# Patient Record
Sex: Male | Born: 1960 | Race: Black or African American | Hispanic: No | Marital: Single | Smoking: Current every day smoker
Health system: Southern US, Community
[De-identification: ages and names within clinical notes are randomized; demographics above are authoritative.]

## PROBLEM LIST (undated history)

## (undated) DIAGNOSIS — F1910 Other psychoactive substance abuse, uncomplicated: Secondary | ICD-10-CM

## (undated) DIAGNOSIS — M109 Gout, unspecified: Secondary | ICD-10-CM

## (undated) DIAGNOSIS — I10 Essential (primary) hypertension: Secondary | ICD-10-CM

## (undated) DIAGNOSIS — E7800 Pure hypercholesterolemia, unspecified: Secondary | ICD-10-CM

## (undated) HISTORY — DX: Essential (primary) hypertension: I10

## (undated) HISTORY — PX: HERNIA REPAIR: SHX51

## (undated) HISTORY — DX: Other psychoactive substance abuse, uncomplicated: F19.10

## (undated) HISTORY — PX: TONSILLECTOMY: SUR1361

---
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
## 1991-07-18 HISTORY — PX: ELBOW FUSION: SHX617

## 2004-05-26 ENCOUNTER — Ambulatory Visit: Payer: Self-pay | Admitting: Family Medicine

## 2004-06-23 ENCOUNTER — Ambulatory Visit: Payer: Self-pay | Admitting: Family Medicine

## 2004-08-19 ENCOUNTER — Emergency Department (HOSPITAL_COMMUNITY): Admission: EM | Admit: 2004-08-19 | Discharge: 2004-08-19 | Payer: Self-pay | Admitting: Emergency Medicine

## 2004-09-22 ENCOUNTER — Ambulatory Visit: Payer: Self-pay | Admitting: Family Medicine

## 2005-01-23 ENCOUNTER — Ambulatory Visit: Payer: Self-pay | Admitting: Family Medicine

## 2005-01-24 ENCOUNTER — Ambulatory Visit (HOSPITAL_COMMUNITY): Admission: RE | Admit: 2005-01-24 | Discharge: 2005-01-24 | Payer: Self-pay | Admitting: Family Medicine

## 2005-03-25 ENCOUNTER — Emergency Department (HOSPITAL_COMMUNITY): Admission: EM | Admit: 2005-03-25 | Discharge: 2005-03-25 | Payer: Self-pay | Admitting: Emergency Medicine

## 2005-05-10 ENCOUNTER — Ambulatory Visit: Payer: Self-pay | Admitting: Family Medicine

## 2005-05-23 ENCOUNTER — Ambulatory Visit: Payer: Self-pay | Admitting: Family Medicine

## 2005-07-13 ENCOUNTER — Ambulatory Visit: Payer: Self-pay | Admitting: Family Medicine

## 2005-08-10 ENCOUNTER — Ambulatory Visit (HOSPITAL_COMMUNITY): Admission: RE | Admit: 2005-08-10 | Discharge: 2005-08-10 | Payer: Self-pay | Admitting: General Surgery

## 2005-09-13 ENCOUNTER — Ambulatory Visit: Payer: Self-pay | Admitting: Family Medicine

## 2005-10-18 ENCOUNTER — Ambulatory Visit: Payer: Self-pay | Admitting: Family Medicine

## 2005-12-10 ENCOUNTER — Emergency Department (HOSPITAL_COMMUNITY): Admission: EM | Admit: 2005-12-10 | Discharge: 2005-12-10 | Payer: Self-pay | Admitting: Emergency Medicine

## 2006-02-20 ENCOUNTER — Ambulatory Visit: Payer: Self-pay | Admitting: Family Medicine

## 2006-02-21 ENCOUNTER — Ambulatory Visit: Payer: Self-pay | Admitting: Family Medicine

## 2006-04-11 ENCOUNTER — Ambulatory Visit: Payer: Self-pay | Admitting: Family Medicine

## 2006-04-17 ENCOUNTER — Ambulatory Visit: Payer: Self-pay | Admitting: Family Medicine

## 2006-05-02 ENCOUNTER — Ambulatory Visit: Payer: Self-pay | Admitting: Family Medicine

## 2006-05-24 ENCOUNTER — Ambulatory Visit: Payer: Self-pay | Admitting: Family Medicine

## 2006-07-19 ENCOUNTER — Ambulatory Visit: Payer: Self-pay | Admitting: Family Medicine

## 2006-11-23 ENCOUNTER — Ambulatory Visit: Payer: Self-pay | Admitting: Family Medicine

## 2007-02-06 ENCOUNTER — Encounter: Payer: Self-pay | Admitting: Family Medicine

## 2007-02-06 ENCOUNTER — Encounter (INDEPENDENT_AMBULATORY_CARE_PROVIDER_SITE_OTHER): Payer: Self-pay | Admitting: *Deleted

## 2007-02-06 LAB — CONVERTED CEMR LAB
Alkaline Phosphatase: 65 units/L (ref 39–117)
BUN: 29 mg/dL — ABNORMAL HIGH (ref 6–23)
Basophils Relative: 1 % (ref 0–1)
Bilirubin, Direct: 0.2 mg/dL (ref 0.0–0.3)
CO2: 22 meq/L (ref 19–32)
Chloride: 106 meq/L (ref 96–112)
Creatinine, Ser: 1.24 mg/dL (ref 0.40–1.50)
Glucose, Bld: 86 mg/dL (ref 70–99)
Hemoglobin: 14.3 g/dL (ref 13.0–17.0)
Indirect Bilirubin: 1 mg/dL — ABNORMAL HIGH (ref 0.0–0.9)
LDL Cholesterol: 80 mg/dL (ref 0–99)
MCHC: 33.5 g/dL (ref 30.0–36.0)
Monocytes Absolute: 0.4 10*3/uL (ref 0.2–0.7)
Monocytes Relative: 7 % (ref 3–11)
Neutro Abs: 3.2 10*3/uL (ref 1.7–7.7)
RBC: 4.28 M/uL (ref 4.22–5.81)
Total Bilirubin: 1.2 mg/dL (ref 0.3–1.2)

## 2007-02-07 ENCOUNTER — Encounter: Payer: Self-pay | Admitting: Family Medicine

## 2007-02-07 LAB — CONVERTED CEMR LAB
Free T4: 1.27 ng/dL (ref 0.89–1.80)
T3, Free: 2.6 pg/mL (ref 2.3–4.2)

## 2007-02-18 ENCOUNTER — Ambulatory Visit: Payer: Self-pay | Admitting: Family Medicine

## 2007-06-03 ENCOUNTER — Ambulatory Visit: Payer: Self-pay | Admitting: Family Medicine

## 2007-10-16 ENCOUNTER — Ambulatory Visit: Payer: Self-pay | Admitting: Family Medicine

## 2007-11-22 ENCOUNTER — Ambulatory Visit: Payer: Self-pay | Admitting: Family Medicine

## 2007-11-27 ENCOUNTER — Encounter (INDEPENDENT_AMBULATORY_CARE_PROVIDER_SITE_OTHER): Payer: Self-pay | Admitting: *Deleted

## 2007-11-27 DIAGNOSIS — F17200 Nicotine dependence, unspecified, uncomplicated: Secondary | ICD-10-CM

## 2007-11-27 DIAGNOSIS — F79 Unspecified intellectual disabilities: Secondary | ICD-10-CM | POA: Insufficient documentation

## 2007-12-18 ENCOUNTER — Emergency Department (HOSPITAL_COMMUNITY): Admission: EM | Admit: 2007-12-18 | Discharge: 2007-12-18 | Payer: Self-pay | Admitting: Emergency Medicine

## 2007-12-28 ENCOUNTER — Emergency Department (HOSPITAL_COMMUNITY): Admission: EM | Admit: 2007-12-28 | Discharge: 2007-12-28 | Payer: Self-pay | Admitting: Emergency Medicine

## 2008-01-27 ENCOUNTER — Encounter: Payer: Self-pay | Admitting: Family Medicine

## 2008-01-27 ENCOUNTER — Emergency Department (HOSPITAL_COMMUNITY): Admission: EM | Admit: 2008-01-27 | Discharge: 2008-01-27 | Payer: Self-pay | Admitting: Emergency Medicine

## 2008-01-27 LAB — CONVERTED CEMR LAB
ALT: 21 units/L (ref 0–53)
BUN: 18 mg/dL (ref 6–23)
Basophils Relative: 0 % (ref 0–1)
Chloride: 101 meq/L (ref 96–112)
Glucose, Bld: 77 mg/dL (ref 70–99)
Indirect Bilirubin: 0.7 mg/dL (ref 0.0–0.9)
LDL Cholesterol: 130 mg/dL — ABNORMAL HIGH (ref 0–99)
Lymphs Abs: 2.3 10*3/uL (ref 0.7–4.0)
Monocytes Relative: 8 % (ref 3–12)
Neutro Abs: 3.8 10*3/uL (ref 1.7–7.7)
Neutrophils Relative %: 51 % (ref 43–77)
PSA: 1.02 ng/mL (ref 0.10–4.00)
Platelets: 156 10*3/uL (ref 150–400)
Potassium: 4 meq/L (ref 3.5–5.3)
RBC: 4.05 M/uL — ABNORMAL LOW (ref 4.22–5.81)
Sodium: 142 meq/L (ref 135–145)
TSH: 1.563 microintl units/mL (ref 0.350–4.50)
Total CHOL/HDL Ratio: 2.3
Total Protein: 7.7 g/dL (ref 6.0–8.3)
Triglycerides: 79 mg/dL (ref <150)
VLDL: 16 mg/dL (ref 0–40)
WBC: 7.4 10*3/uL (ref 4.0–10.5)

## 2008-01-29 ENCOUNTER — Emergency Department (HOSPITAL_COMMUNITY): Admission: EM | Admit: 2008-01-29 | Discharge: 2008-01-29 | Payer: Self-pay | Admitting: Emergency Medicine

## 2008-03-11 ENCOUNTER — Encounter: Payer: Self-pay | Admitting: Family Medicine

## 2008-06-26 ENCOUNTER — Ambulatory Visit: Payer: Self-pay | Admitting: Family Medicine

## 2008-06-26 DIAGNOSIS — E785 Hyperlipidemia, unspecified: Secondary | ICD-10-CM

## 2008-06-26 DIAGNOSIS — F1020 Alcohol dependence, uncomplicated: Secondary | ICD-10-CM

## 2008-06-26 DIAGNOSIS — M25519 Pain in unspecified shoulder: Secondary | ICD-10-CM

## 2008-07-23 ENCOUNTER — Telehealth: Payer: Self-pay | Admitting: Family Medicine

## 2008-07-27 ENCOUNTER — Encounter: Payer: Self-pay | Admitting: Family Medicine

## 2008-09-09 ENCOUNTER — Encounter: Payer: Self-pay | Admitting: Family Medicine

## 2008-09-25 ENCOUNTER — Ambulatory Visit: Payer: Self-pay | Admitting: Family Medicine

## 2008-09-28 ENCOUNTER — Encounter: Payer: Self-pay | Admitting: Family Medicine

## 2008-09-28 LAB — CONVERTED CEMR LAB
Cholesterol: 277 mg/dL — ABNORMAL HIGH (ref 0–200)
Total CHOL/HDL Ratio: 2.4

## 2010-12-02 NOTE — H&P (Signed)
NAME:  Wesley Brennan, Wesley Brennan                ACCOUNT NO.:  [id number]   MEDICAL RECORD NO.:  [id number]          PATIENT TYPE:  AMB   LOCATION:  DAY                           FACILITY:  [facility]   PHYSICIAN:  Jerolyn Shin C. Katrinka Blazing, M.D.   DATE OF BIRTH:  September 23, 1960   DATE OF ADMISSION:  DATE OF DISCHARGE:  LH                                HISTORY & PHYSICAL   HISTORY OF PRESENT ILLNESS:  A 50 year old male with a history of guaiac  positive stools, weight loss, who was referred for upper and lower  endoscopy.  He has been very active.  His appetite has been very good.  He  has had no discomfort with bowel movements.  He denies abdominal pain.   PAST HISTORY:  1.  Positive for mental retardation.  2.  Hypertension.   SURGERY:  1.  Effusion of the left elbow.  2.  Umbilical hernia.   MEDICATIONS:  1.  Benicar 20 mg daily.  2.  Aspirin 81 mg daily.  3.  Lipitor 20 mg daily.   FAMILY HISTORY:  Positive for bone cancer and atherosclerotic heart disease.   PHYSICAL EXAMINATION:  VITAL SIGNS:  Blood pressure 130/82, pulse 70,  respirations 20, weight 165 pounds.  HEENT:  Unremarkable, except for multiple missing teeth.  NECK:  Supple without JVD, bruit, adenopathy or thyromegaly.  CHEST:  Clear to auscultation.  HEART:  Regular rate and rhythm without murmur, gallop, or rub.  ABDOMEN:  Soft, nontender.  No masses.  RECTAL:  Normal.  Stool guaiac positive.  EXTREMITIES:  No clubbing, cyanosis, or edema.  NEUROLOGIC:  No focal motor, sensory or cerebellar deficits.   IMPRESSION:  1.  Guaiac positive stools.  2.  Weight loss.  3.  Hypertension.  4.  Mental retardation.   PLAN:  Upper and lower endoscopy.      Dirk Dress. Katrinka Blazing, M.D.  Electronically Signed     LCS/MEDQ  D:  08/09/2005  T:  08/10/2005  Job:  [id number]

## 2011-01-20 ENCOUNTER — Emergency Department (HOSPITAL_COMMUNITY)
Admission: EM | Admit: 2011-01-20 | Discharge: 2011-01-20 | Disposition: A | Discharge status: Home / Self Care | Payer: Medicare Other | Attending: Emergency Medicine | Admitting: Emergency Medicine

## 2011-01-20 DIAGNOSIS — L723 Sebaceous cyst: Secondary | ICD-10-CM | POA: Insufficient documentation

## 2011-02-26 ENCOUNTER — Emergency Department (HOSPITAL_COMMUNITY)
Admission: EM | Admit: 2011-02-26 | Discharge: 2011-02-26 | Disposition: A | Discharge status: Home / Self Care | Payer: Medicare Other | Attending: Emergency Medicine | Admitting: Emergency Medicine

## 2011-02-26 DIAGNOSIS — W230XXA Caught, crushed, jammed, or pinched between moving objects, initial encounter: Secondary | ICD-10-CM | POA: Insufficient documentation

## 2011-02-26 DIAGNOSIS — S6000XA Contusion of unspecified finger without damage to nail, initial encounter: Secondary | ICD-10-CM | POA: Insufficient documentation

## 2011-02-26 DIAGNOSIS — F17200 Nicotine dependence, unspecified, uncomplicated: Secondary | ICD-10-CM | POA: Insufficient documentation

## 2011-02-26 HISTORY — DX: Pure hypercholesterolemia, unspecified: E78.00

## 2011-02-26 NOTE — ED Provider Notes (Signed)
History     CSN: [id number] Arrival date & time: 02/26/2011  1:14 AM  Chief Complaint  Patient presents with  . Finger Injury   The history is provided by the patient (The patient complains of injuring his right index finger over a month ago he states he has no pain with it and then it was a crushing he states there is a purple discoloration underneath his right index finger nail he complains of no decreased motion no ot).    Past Medical History  Diagnosis Date  . Hypercholesteremia     History reviewed. No pertinent past surgical history.  No family history on file.  History  Substance Use Topics  . Smoking status: Current Everyday Smoker    Types: Cigarettes  . Smokeless tobacco: Not on file  . Alcohol Use: Yes     everyday      Review of Systems  Constitutional: Negative for fatigue.  HENT: Negative for congestion, sinus pressure and ear discharge.   Eyes: Negative for discharge.  Respiratory: Negative for cough.   Cardiovascular: Negative for chest pain.  Gastrointestinal: Negative for abdominal pain and diarrhea.  Genitourinary: Negative for frequency and hematuria.  Musculoskeletal: Negative for back pain.       Right index crush injury  Skin: Negative for rash.  Neurological: Negative for seizures and headaches.  Hematological: Negative.   Psychiatric/Behavioral: Negative for hallucinations.    Physical Exam  BP 115/73  Pulse 90  Temp(Src) 97.8 F (36.6 C) (Oral)  Resp 20  Ht 6' (1.829 m)  Wt 190 lb (86.183 kg)  BMI 25.77 kg/m2  SpO2 100%  Physical Exam  Constitutional: He is oriented to person, place, and time. He appears well-developed.  HENT:  Head: Normocephalic and atraumatic.  Eyes: Conjunctivae and EOM are normal. No scleral icterus.  Neck: Neck supple. No tracheal deviation present. No thyromegaly present.  Cardiovascular: Normal rate and regular rhythm.  Exam reveals no gallop and no friction rub.   No murmur heard. Pulmonary/Chest:  No stridor. He has no wheezes. He has no rales. He exhibits no tenderness.  Abdominal: He exhibits no distension. There is no tenderness. There is no rebound.  Musculoskeletal: Normal range of motion. He exhibits no edema.       Patient had some purple discoloration under the nail bed of the right index finger he has full range of motion neurovascular exam is normal the finger is nontender  Lymphadenopathy:    He has no cervical adenopathy.  Neurological: He is oriented to person, place, and time. Coordination normal.  Skin: Skin is warm. No rash noted. No erythema.  Psychiatric: He has a normal mood and affect. His behavior is normal.    ED Course  Procedures  MDM    Hematoma old under right index nail      Benny Lennert, MD 02/26/11 0200

## 2011-02-26 NOTE — ED Notes (Signed)
Noted old bruise to left pointer finger

## 2011-02-26 NOTE — ED Notes (Signed)
Hit left hand index finger 1 month ago while working, now wants hand checked

## 2011-09-05 ENCOUNTER — Encounter: Payer: Self-pay | Admitting: Family Medicine

## 2011-09-05 ENCOUNTER — Ambulatory Visit (INDEPENDENT_AMBULATORY_CARE_PROVIDER_SITE_OTHER): Payer: Medicare Other | Admitting: Family Medicine

## 2011-09-05 VITALS — BP 138/80 | HR 98 | Resp 18 | Ht 71.0 in | Wt 159.0 lb

## 2011-09-05 DIAGNOSIS — F17200 Nicotine dependence, unspecified, uncomplicated: Secondary | ICD-10-CM | POA: Diagnosis not present

## 2011-09-05 DIAGNOSIS — F1020 Alcohol dependence, uncomplicated: Secondary | ICD-10-CM

## 2011-09-05 DIAGNOSIS — E785 Hyperlipidemia, unspecified: Secondary | ICD-10-CM

## 2011-09-05 DIAGNOSIS — J209 Acute bronchitis, unspecified: Secondary | ICD-10-CM

## 2011-09-05 MED ORDER — BENZONATATE 100 MG PO CAPS: 100.0000 mg | ORAL_CAPSULE | Freq: Four times a day (QID) | ORAL | Status: DC | PRN

## 2011-09-05 MED ORDER — PENICILLIN V POTASSIUM 500 MG PO TABS: 500.0000 mg | ORAL_TABLET | Freq: Three times a day (TID) | ORAL | Status: AC

## 2011-09-05 MED ORDER — PENICILLIN V POTASSIUM 500 MG PO TABS: 500.0000 mg | ORAL_TABLET | Freq: Three times a day (TID) | ORAL | Status: DC

## 2011-09-05 NOTE — Assessment & Plan Note (Signed)
counseled to reduce usage with a plan to quit, current is 1ppd

## 2011-09-05 NOTE — Assessment & Plan Note (Signed)
Hyperlipidemia:Low fat diet discussed and encouraged.  Labs past due 

## 2011-09-05 NOTE — Assessment & Plan Note (Signed)
drinkls 80 ounces beer daily advised to reduce use

## 2011-09-05 NOTE — Patient Instructions (Addendum)
CPE in 4.5 month   You have med sent to [facility] for acute bronchitis.  Fasting labs in the morning.  Pls try to stop cigarette and beer use, cut back slowly

## 2011-09-07 LAB — COMPLETE METABOLIC PANEL WITH GFR
Albumin: 4.8 g/dL (ref 3.5–5.2)
Alkaline Phosphatase: 68 U/L (ref 39–117)
BUN: 24 mg/dL — ABNORMAL HIGH (ref 6–23)
CO2: 25 mEq/L (ref 19–32)
Calcium: 9.2 mg/dL (ref 8.4–10.5)
Chloride: 104 mEq/L (ref 96–112)
GFR, Est African American: >89 mL/min
GFR, Est Non African American: 81 mL/min
Glucose, Bld: 84 mg/dL (ref 70–99)
Potassium: 4.4 mEq/L (ref 3.5–5.3)
Sodium: 141 mEq/L (ref 135–145)
Total Protein: 7.5 g/dL (ref 6.0–8.3)

## 2011-09-07 LAB — TSH: TSH: 1.582 u[IU]/mL (ref 0.350–4.500)

## 2011-09-07 LAB — LIPID PANEL: Cholesterol: 251 mg/dL — ABNORMAL HIGH (ref 0–200)

## 2011-09-10 NOTE — Assessment & Plan Note (Signed)
Acute infection, antibiotics prescribed 

## 2011-09-10 NOTE — Progress Notes (Signed)
  Subjective:    Patient ID: Wesley Brennan, male    DOB: [birth year], 51 y.o.   MRN: [id number]  HPI 1 week h/o head and chest congestion, intermittent chills and fever, generalized weakness. Still smoking and drinking beer daily, states less, no quit date set. Has  Been lost to regular care for months but states he is back living locally and intends to keep up with his care   Review of Systems See HPI   . Denies chest pains, palpitations and leg swelling Denies abdominal pain, nausea, vomiting,diarrhea or constipation.   Denies dysuria, frequency, hesitancy or incontinence. Denies joint pain, swelling and limitation in mobility. Denies headaches, seizures, numbness, or tingling. Denies depression, anxiety or insomnia. Denies skin break down or rash.     Objective:   Physical Exam Patient alert and oriented and in no cardiopulmonary distress.  HEENT: No facial asymmetry, EOMI, maxillary sinus tenderness,  oropharynx pink and moist.  Neck supple no adenopathy.  Chest: decreased air entry, with crackles and wheezes  CVS: S1, S2 no murmurs, no S3.  ABD: Soft non tender. Bowel sounds normal.  Ext: No edema  MS: Adequate ROM spine, shoulders, hips and knees.  Skin: Intact, no ulcerations or rash noted.  Psych: Good eye contact, normal affect. Memory intact not anxious or depressed appearing.  CNS: CN 2-12 intact, power, tone and sensation normal throughout.        Assessment & Plan:

## 2011-09-12 ENCOUNTER — Other Ambulatory Visit: Payer: Self-pay | Admitting: Family Medicine

## 2011-09-19 ENCOUNTER — Other Ambulatory Visit: Payer: Self-pay

## 2011-09-19 MED ORDER — PRAVASTATIN SODIUM 40 MG PO TABS: 40.0000 mg | ORAL_TABLET | Freq: Every evening | ORAL | Status: DC

## 2011-12-18 DIAGNOSIS — H31019 Macula scars of posterior pole (postinflammatory) (post-traumatic), unspecified eye: Secondary | ICD-10-CM | POA: Diagnosis not present

## 2011-12-18 DIAGNOSIS — H2510 Age-related nuclear cataract, unspecified eye: Secondary | ICD-10-CM | POA: Diagnosis not present

## 2012-02-02 ENCOUNTER — Ambulatory Visit (INDEPENDENT_AMBULATORY_CARE_PROVIDER_SITE_OTHER): Payer: Medicare Other | Admitting: Family Medicine

## 2012-02-02 ENCOUNTER — Encounter: Payer: Self-pay | Admitting: Family Medicine

## 2012-02-02 VITALS — BP 126/86 | HR 90 | Resp 18 | Ht 71.0 in | Wt 152.1 lb

## 2012-02-02 DIAGNOSIS — Z125 Encounter for screening for malignant neoplasm of prostate: Secondary | ICD-10-CM | POA: Diagnosis not present

## 2012-02-02 DIAGNOSIS — E785 Hyperlipidemia, unspecified: Secondary | ICD-10-CM

## 2012-02-02 DIAGNOSIS — Z0000 Encounter for general adult medical examination without abnormal findings: Secondary | ICD-10-CM

## 2012-02-02 DIAGNOSIS — F17200 Nicotine dependence, unspecified, uncomplicated: Secondary | ICD-10-CM

## 2012-02-02 DIAGNOSIS — F1020 Alcohol dependence, uncomplicated: Secondary | ICD-10-CM

## 2012-02-02 MED ORDER — ADULT MULTIVITAMIN LIQUID CH: 5.0000 mL | Freq: Every day | ORAL | Status: DC

## 2012-02-02 MED ORDER — PRAVASTATIN SODIUM 40 MG PO TABS: 40.0000 mg | ORAL_TABLET | Freq: Every evening | ORAL | Status: DC

## 2012-02-02 NOTE — Patient Instructions (Addendum)
F/u in mid January  Fasting lipid, hepatic and PSA tomorrow /as soon as possible   Please cut back on beer and cigarettes, you need to quit both. You are improving , which is good.   Keep active and reduce fried and fatty foods and red meat.  Multivitamin one daily is prescribed  I will retrieve your colonoscopy done in 2007, let you know when you next need one

## 2012-02-03 DIAGNOSIS — Z0000 Encounter for general adult medical examination without abnormal findings: Secondary | ICD-10-CM | POA: Insufficient documentation
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
## 2012-02-03 NOTE — Assessment & Plan Note (Signed)
Hyperlipidemia:Low fat diet discussed and encouraged.  Updated labs needed 

## 2012-02-03 NOTE — Assessment & Plan Note (Signed)
Cessation counseling doen, has reduced usage
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
## 2012-02-03 NOTE — Assessment & Plan Note (Signed)
Reports current is 3 beers per night advised to cut back to 2, needs to quit due to personal history

## 2012-02-05 ENCOUNTER — Other Ambulatory Visit: Payer: Self-pay | Admitting: Family Medicine

## 2012-02-05 DIAGNOSIS — Z125 Encounter for screening for malignant neoplasm of prostate: Secondary | ICD-10-CM | POA: Diagnosis not present

## 2012-02-05 DIAGNOSIS — E785 Hyperlipidemia, unspecified: Secondary | ICD-10-CM | POA: Diagnosis not present

## 2012-02-05 LAB — PSA: PSA: 1.7 ng/mL (ref <=4.00)

## 2012-02-06 LAB — HEPATIC FUNCTION PANEL
ALT: 47 U/L (ref 0–53)
AST: 99 U/L — ABNORMAL HIGH (ref 0–37)
Bilirubin, Direct: 0.2 mg/dL (ref 0.0–0.3)
Total Protein: 7.2 g/dL (ref 6.0–8.3)

## 2012-02-06 LAB — LIPID PANEL
Cholesterol: 204 mg/dL — ABNORMAL HIGH (ref 0–200)
Total CHOL/HDL Ratio: 2.9 Ratio
Triglycerides: 476 mg/dL — ABNORMAL HIGH (ref <150)

## 2012-02-08 NOTE — Progress Notes (Signed)
Patient aware.

## 2012-02-09 LAB — HEPATITIS PANEL, ACUTE
HCV Ab: NEGATIVE
Hepatitis B Surface Ag: NEGATIVE

## 2012-05-01 ENCOUNTER — Ambulatory Visit (INDEPENDENT_AMBULATORY_CARE_PROVIDER_SITE_OTHER): Payer: Medicare Other

## 2012-05-01 DIAGNOSIS — Z23 Encounter for immunization: Secondary | ICD-10-CM | POA: Diagnosis not present

## 2012-05-13 ENCOUNTER — Ambulatory Visit (INDEPENDENT_AMBULATORY_CARE_PROVIDER_SITE_OTHER): Payer: Medicare Other | Admitting: Family Medicine

## 2012-05-13 ENCOUNTER — Encounter: Payer: Self-pay | Admitting: Family Medicine

## 2012-05-14 NOTE — Progress Notes (Signed)
Subjective:     Patient ID: Wesley Brennan, male   DOB: 08/29/1960, 51 y.o.   MRN: [id number]  HPI   Review of Systems     Objective:   Physical Exam     Assessment:         Plan:       Pt came late, then had to leave for work

## 2012-05-21 ENCOUNTER — Emergency Department (HOSPITAL_COMMUNITY)
Admission: EM | Admit: 2012-05-21 | Discharge: 2012-05-21 | Disposition: A | Discharge status: Home / Self Care | Payer: Medicare Other | Attending: Emergency Medicine | Admitting: Emergency Medicine

## 2012-05-21 ENCOUNTER — Emergency Department (HOSPITAL_COMMUNITY): Payer: Medicare Other

## 2012-05-21 ENCOUNTER — Encounter (HOSPITAL_COMMUNITY): Payer: Self-pay | Admitting: Emergency Medicine

## 2012-05-21 DIAGNOSIS — Z981 Arthrodesis status: Secondary | ICD-10-CM | POA: Insufficient documentation

## 2012-05-21 DIAGNOSIS — F1010 Alcohol abuse, uncomplicated: Secondary | ICD-10-CM | POA: Insufficient documentation

## 2012-05-21 DIAGNOSIS — S42402A Unspecified fracture of lower end of left humerus, initial encounter for closed fracture: Secondary | ICD-10-CM

## 2012-05-21 DIAGNOSIS — E7800 Pure hypercholesterolemia, unspecified: Secondary | ICD-10-CM | POA: Insufficient documentation

## 2012-05-21 DIAGNOSIS — Y929 Unspecified place or not applicable: Secondary | ICD-10-CM | POA: Insufficient documentation

## 2012-05-21 DIAGNOSIS — Y9389 Activity, other specified: Secondary | ICD-10-CM | POA: Insufficient documentation

## 2012-05-21 DIAGNOSIS — F17200 Nicotine dependence, unspecified, uncomplicated: Secondary | ICD-10-CM | POA: Insufficient documentation

## 2012-05-21 DIAGNOSIS — S42409A Unspecified fracture of lower end of unspecified humerus, initial encounter for closed fracture: Secondary | ICD-10-CM | POA: Insufficient documentation

## 2012-05-21 DIAGNOSIS — T85698A Other mechanical complication of other specified internal prosthetic devices, implants and grafts, initial encounter: Secondary | ICD-10-CM | POA: Insufficient documentation

## 2012-05-21 DIAGNOSIS — R296 Repeated falls: Secondary | ICD-10-CM | POA: Insufficient documentation

## 2012-05-21 MED ORDER — ONDANSETRON HCL 4 MG/2ML IJ SOLN
4.0000 mg | Freq: Once | INTRAMUSCULAR | Status: AC
  Administered 2012-05-21: 4 mg via INTRAVENOUS
  Filled 2012-05-21: qty 2

## 2012-05-21 MED ORDER — OXYCODONE-ACETAMINOPHEN 5-325 MG PO TABS: 1.0000 | ORAL_TABLET | Freq: Four times a day (QID) | ORAL | Status: DC | PRN

## 2012-05-21 MED ORDER — HYDROMORPHONE HCL PF 1 MG/ML IJ SOLN
1.0000 mg | Freq: Once | INTRAMUSCULAR | Status: AC
  Administered 2012-05-21: 1 mg via INTRAVENOUS
  Filled 2012-05-21: qty 1

## 2012-05-21 NOTE — ED Provider Notes (Signed)
History  This chart was scribed for Wesley Hutching, MD by Ladona Ridgel Day. This patient was seen in room APA02/[facility]02 and the patient's care was started at 1345.   CSN: [id number]  Arrival date & time 05/21/12  1345   First MD Initiated Contact with Patient 05/21/12 1449      Chief Complaint  Patient presents with  . Fall  . Elbow Pain   Patient is a 51 y.o. male presenting with extremity pain. The history is provided by the patient. No language interpreter was used.  Extremity Pain This is a new problem. The current episode started yesterday. The problem occurs constantly. The problem has not changed since onset.Pertinent negatives include no chest pain, no abdominal pain and no shortness of breath. Exacerbated by: movement of his entire arm. The symptoms are relieved by rest. He has tried nothing for the symptoms.   Wesley Brennan is a 51 y.o. male who presents to the Emergency Department complaining of severe constant left elbow pain after he fell and injuried it while taking out the trash last PM. He states had a left elbow fusion surgery years ago done in [place], [place] secondary to frequent dislocations. He states associated pain/swelling over his left elbow. He states has very minimal ROM at left elbow which is baseline after his fusion surgery.   Past Medical History  Diagnosis Date  . Hypercholesteremia   . Substance abuse     alcohol, nicotine, h/o coacaine and marijuana    Past Surgical History  Procedure Date  . Hernia repair 1961 approx    umbilical hernia  . Elbow fusion 1993    left elbow dislocation    Family History  Problem Relation Age of Onset  . Adopted: Yes    History  Substance Use Topics  . Smoking status: Current Every Day Smoker -- 1.0 packs/day    Types: Cigarettes  . Smokeless tobacco: Not on file  . Alcohol Use: 1.8 oz/week    3 Cans of beer per week     Comment: everyday      Review of Systems  Constitutional: Negative for fever and chills.    Respiratory: Negative for shortness of breath.   Cardiovascular: Negative for chest pain.  Gastrointestinal: Negative for nausea, vomiting and abdominal pain.  Musculoskeletal:       Left elbow pain  Neurological: Negative for weakness.  All other systems reviewed and are negative.   A complete 10 system review of systems was obtained and all systems are negative except as noted in the HPI and PMH.   Allergies  Review of patient's allergies indicates no known allergies.  Home Medications  No current outpatient prescriptions on file.  Triage Vitals: BP 137/76  Pulse 73  Temp 98.7 F (37.1 C) (Oral)  Resp 20  Ht 5\' 11"  (1.803 m)  Wt 159 lb (72.122 kg)  BMI 22.18 kg/m2  SpO2 100%  Physical Exam  Nursing note and vitals reviewed. Constitutional: He is oriented to person, place, and time. He appears well-developed and well-nourished.  HENT:  Head: Normocephalic and atraumatic.  Eyes: Conjunctivae normal and EOM are normal. Pupils are equal, round, and reactive to light.  Neck: Normal range of motion. Neck supple.  Cardiovascular: Normal rate, regular rhythm and normal heart sounds.   Pulmonary/Chest: Effort normal and breath sounds normal.  Abdominal: Soft. Bowel sounds are normal.  Musculoskeletal: Normal range of motion. He exhibits tenderness (tender over lateral aspect of left elbow).  Couldn't flex or extend his left elbow secondary to fusion   Neurological: He is alert and oriented to person, place, and time.  Skin: Skin is warm and dry.  Psychiatric: He has a normal mood and affect.    ED Course  Procedures (including critical care time) DIAGNOSTIC STUDIES: Oxygen Saturation is 100% on room air, normal by my interpretation.    COORDINATION OF CARE: At 315 PM Discussed treatment plan with patient which includes left elbow X-ray, surgery consult. Patient agrees.   Labs Reviewed - No data to display Dg Elbow Complete Left  05/21/2012  *RADIOLOGY REPORT*   Clinical Data: Elbow fusion 2 years ago, pain post fall  LEFT ELBOW - COMPLETE 3+ VIEW  Comparison: None  Findings: Post elbow joint effusion with a large screw present across the humero-ulnar joint. The screw is fractured at at the level of the proximal threads. Scattered suture wires present. Post-traumatic deformity of the right radius is seen. A linear lucency identified across the distal humerus compatible with a nondisplaced fracture. Degenerative changes of the radiocapitellar joint. Implanted stimulator at the posterior aspect of the distal upper arm.  IMPRESSION: Prior humero-ulnar fusion. Nondisplaced fracture distal left humeral metaphysis, associated with fracture of the hardware/screw.   Original Report Authenticated By: Ulyses Southward, M.D.      No diagnosis found.    MDM  X-ray left elbow shows a nondisplaced fracture of the distal left humerus with associated fracture of the hardware.  Discussed with Dr. Hilda Lias.  He will followup in office. Splint, ice, sling, pain meds. Neurovascular intact    I personally performed the services described in this documentation, which was scribed in my presence. The recorded information has been reviewed and considered.          Wesley Hutching, MD 05/21/12 [phone]

## 2012-05-21 NOTE — ED Notes (Signed)
Larey Seat while helping friend move trash can

## 2012-05-29 ENCOUNTER — Telehealth: Payer: Self-pay | Admitting: Orthopedic Surgery

## 2012-05-29 ENCOUNTER — Encounter: Payer: Self-pay | Admitting: Family Medicine

## 2012-05-29 ENCOUNTER — Ambulatory Visit (INDEPENDENT_AMBULATORY_CARE_PROVIDER_SITE_OTHER): Payer: Medicare Other | Admitting: Family Medicine

## 2012-05-29 VITALS — BP 152/86 | HR 80 | Resp 18 | Ht 71.0 in | Wt 166.1 lb

## 2012-05-29 DIAGNOSIS — E785 Hyperlipidemia, unspecified: Secondary | ICD-10-CM

## 2012-05-29 DIAGNOSIS — F17200 Nicotine dependence, unspecified, uncomplicated: Secondary | ICD-10-CM

## 2012-05-29 DIAGNOSIS — I10 Essential (primary) hypertension: Secondary | ICD-10-CM | POA: Diagnosis not present

## 2012-05-29 DIAGNOSIS — S42309A Unspecified fracture of shaft of humerus, unspecified arm, initial encounter for closed fracture: Secondary | ICD-10-CM | POA: Diagnosis not present

## 2012-05-29 DIAGNOSIS — M79609 Pain in unspecified limb: Secondary | ICD-10-CM | POA: Diagnosis not present

## 2012-05-29 DIAGNOSIS — R52 Pain, unspecified: Secondary | ICD-10-CM | POA: Diagnosis not present

## 2012-05-29 MED ORDER — AMLODIPINE BESYLATE 5 MG PO TABS: 5.0000 mg | ORAL_TABLET | Freq: Every day | ORAL | Status: DC

## 2012-05-29 MED ORDER — TRAMADOL HCL 50 MG PO TABS
50.0000 mg | ORAL_TABLET | Freq: Three times a day (TID) | ORAL | Status: DC | PRN
Start: 2012-05-29 — End: 2012-12-17

## 2012-05-29 NOTE — Progress Notes (Signed)
  Subjective:    Patient ID: Wesley Brennan, male    DOB: Jun 17, 1961, 51 y.o.   MRN: [id number]  HPI  Pt recently sustained a fracture of his left humerus, and needs referral to a local orthopedic oc. He has already had surgery on the same arm ,and , unfortunately states he was told he would need to return to the Doc who had operated in the past , reportedly. Nicotine use is unchanged, and he is unwilling to set a quit date, counselling is done.   Review of Systems See HPI Denies recent fever or chills. Denies sinus pressure, nasal congestion, ear pain or sore throat. Denies chest congestion, productive cough or wheezing. Denies chest pains, palpitations and leg swelling Denies abdominal pain, nausea, vomiting,diarrhea or constipation.   Denies dysuria, frequency, hesitancy or incontinence.  Denies headaches, seizures, numbness, or tingling. Denies depression, anxiety or insomnia. Denies skin break down or rash.        Objective:   Physical Exam  Patient alert and oriented and in no cardiopulmonary distress.  HEENT: No facial asymmetry, EOMI, no sinus tenderness,  oropharynx pink and moist.  Neck supple no adenopathy.  Chest: Clear to auscultation bilaterally.Decreased air entry throughout  CVS: S1, S2 no murmurs, no S3.  ABD: Soft non tender. Bowel sounds normal.  Ext: No edema  MS: Adequate ROM spine, decreased in left upper extremity with tenderness over area of fractured humerus Skin: Intact, no ulcerations or rash noted.  Psych: Good eye contact, normal affect. Memory intact not anxious or depressed appearing.  CNS: CN 2-12 intact, power, tone and sensation normal throughout.       Assessment & Plan:

## 2012-05-29 NOTE — Telephone Encounter (Signed)
Received call from Toni Amend, nurse at [facility] primary care office, asking if Dr. Romeo Apple "sees patient's for humerus fractures."  Patient relates to their office that he was referred by [facility] to Dr. Hilda Lias, and that Dr. Hilda Lias could not see him. (?history of previous surgery, same arm?)  Please review notes, film, and please advise.  Patient's ph#[phone].

## 2012-05-29 NOTE — Patient Instructions (Addendum)
F/u in mid January  You are referred to Dr Romeo Apple re left arm fracture   Tramadol is sent in for use for pain, as needed  Blood pressure is high, medication, amlodipine is sent in for this  You need to stop smoking ciggaretes entirely, this will improve your health, you also need to stop drinking

## 2012-06-02 ENCOUNTER — Encounter (HOSPITAL_COMMUNITY): Payer: Self-pay | Admitting: Emergency Medicine

## 2012-06-02 ENCOUNTER — Emergency Department (HOSPITAL_COMMUNITY): Payer: Medicare Other

## 2012-06-02 ENCOUNTER — Emergency Department (HOSPITAL_COMMUNITY)
Admission: EM | Admit: 2012-06-02 | Discharge: 2012-06-02 | Disposition: A | Discharge status: Other Acute Inpatient Hospital | Payer: Medicare Other | Attending: Emergency Medicine | Admitting: Emergency Medicine

## 2012-06-02 DIAGNOSIS — Z981 Arthrodesis status: Secondary | ICD-10-CM | POA: Diagnosis not present

## 2012-06-02 DIAGNOSIS — F1010 Alcohol abuse, uncomplicated: Secondary | ICD-10-CM | POA: Insufficient documentation

## 2012-06-02 DIAGNOSIS — E7800 Pure hypercholesterolemia, unspecified: Secondary | ICD-10-CM | POA: Insufficient documentation

## 2012-06-02 DIAGNOSIS — L03114 Cellulitis of left upper limb: Secondary | ICD-10-CM

## 2012-06-02 DIAGNOSIS — I10 Essential (primary) hypertension: Secondary | ICD-10-CM | POA: Diagnosis not present

## 2012-06-02 DIAGNOSIS — L0291 Cutaneous abscess, unspecified: Secondary | ICD-10-CM | POA: Diagnosis not present

## 2012-06-02 DIAGNOSIS — R279 Unspecified lack of coordination: Secondary | ICD-10-CM | POA: Diagnosis not present

## 2012-06-02 DIAGNOSIS — IMO0002 Reserved for concepts with insufficient information to code with codable children: Secondary | ICD-10-CM | POA: Diagnosis not present

## 2012-06-02 DIAGNOSIS — Y929 Unspecified place or not applicable: Secondary | ICD-10-CM | POA: Insufficient documentation

## 2012-06-02 DIAGNOSIS — E785 Hyperlipidemia, unspecified: Secondary | ICD-10-CM | POA: Diagnosis not present

## 2012-06-02 DIAGNOSIS — M25539 Pain in unspecified wrist: Secondary | ICD-10-CM | POA: Diagnosis not present

## 2012-06-02 DIAGNOSIS — M25529 Pain in unspecified elbow: Secondary | ICD-10-CM | POA: Diagnosis not present

## 2012-06-02 DIAGNOSIS — X58XXXA Exposure to other specified factors, initial encounter: Secondary | ICD-10-CM | POA: Insufficient documentation

## 2012-06-02 DIAGNOSIS — S42409A Unspecified fracture of lower end of unspecified humerus, initial encounter for closed fracture: Secondary | ICD-10-CM | POA: Diagnosis not present

## 2012-06-02 DIAGNOSIS — F17200 Nicotine dependence, unspecified, uncomplicated: Secondary | ICD-10-CM | POA: Insufficient documentation

## 2012-06-02 DIAGNOSIS — S42413A Displaced simple supracondylar fracture without intercondylar fracture of unspecified humerus, initial encounter for closed fracture: Secondary | ICD-10-CM | POA: Diagnosis not present

## 2012-06-02 DIAGNOSIS — M19229 Secondary osteoarthritis, unspecified elbow: Secondary | ICD-10-CM | POA: Diagnosis present

## 2012-06-02 DIAGNOSIS — S42309A Unspecified fracture of shaft of humerus, unspecified arm, initial encounter for closed fracture: Secondary | ICD-10-CM | POA: Insufficient documentation

## 2012-06-02 DIAGNOSIS — S42302A Unspecified fracture of shaft of humerus, left arm, initial encounter for closed fracture: Secondary | ICD-10-CM

## 2012-06-02 DIAGNOSIS — Y939 Activity, unspecified: Secondary | ICD-10-CM | POA: Insufficient documentation

## 2012-06-02 DIAGNOSIS — L0390 Cellulitis, unspecified: Secondary | ICD-10-CM | POA: Diagnosis not present

## 2012-06-02 DIAGNOSIS — S72453A Displaced supracondylar fracture without intracondylar extension of lower end of unspecified femur, initial encounter for closed fracture: Secondary | ICD-10-CM | POA: Diagnosis not present

## 2012-06-02 DIAGNOSIS — M7989 Other specified soft tissue disorders: Secondary | ICD-10-CM | POA: Insufficient documentation

## 2012-06-02 DIAGNOSIS — R599 Enlarged lymph nodes, unspecified: Secondary | ICD-10-CM | POA: Diagnosis not present

## 2012-06-02 LAB — CBC WITH DIFFERENTIAL/PLATELET
Eosinophils Relative: 3 % (ref 0–5)
HCT: 35.9 % — ABNORMAL LOW (ref 39.0–52.0)
Hemoglobin: 11.9 g/dL — ABNORMAL LOW (ref 13.0–17.0)
Lymphocytes Relative: 13 % (ref 12–46)
MCHC: 33.1 g/dL (ref 30.0–36.0)
MCV: 98.6 fL (ref 78.0–100.0)
Monocytes Absolute: 1.2 10*3/uL — ABNORMAL HIGH (ref 0.1–1.0)
Monocytes Relative: 9 % (ref 3–12)
Neutro Abs: 10.7 10*3/uL — ABNORMAL HIGH (ref 1.7–7.7)
RDW: 11.9 % (ref 11.5–15.5)
WBC: 14.1 10*3/uL — ABNORMAL HIGH (ref 4.0–10.5)

## 2012-06-02 LAB — BASIC METABOLIC PANEL
BUN: 9 mg/dL (ref 6–23)
CO2: 28 mEq/L (ref 19–32)
Calcium: 9.9 mg/dL (ref 8.4–10.5)
Chloride: 98 mEq/L (ref 96–112)
Creatinine, Ser: 0.92 mg/dL (ref 0.50–1.35)

## 2012-06-02 MED ORDER — VANCOMYCIN HCL IN DEXTROSE 1-5 GM/200ML-% IV SOLN
1000.0000 mg | Freq: Once | INTRAVENOUS | Status: AC
  Administered 2012-06-02: 1000 mg via INTRAVENOUS
  Filled 2012-06-02: qty 200

## 2012-06-02 NOTE — Assessment & Plan Note (Signed)
Referred to ortho for management

## 2012-06-02 NOTE — ED Provider Notes (Signed)
History   This chart was scribed for Donnetta Hutching, MD by Charolett Bumpers, ER Scribe. The patient was seen in room APA10/APA10. Patient's care was started at 1105.   CSN: [id number]  Arrival date & time 06/02/12  1004   First MD Initiated Contact with Patient 06/02/12 1105      Chief Complaint  Patient presents with  . Elbow Pain    The history is provided by the patient. No language interpreter was used.   Wesley Brennan is a 51 y.o. male who presents to the Emergency Department complaining of constant, worsening left elbow swelling with associated warmth and redness. He states his left elbow is sore. He states that he had a previous surgery on his left elbow in 1979 at [facility] for an elbow fusion in which hardware was placed. He states he fell 9 days ago and fractured his left distal humerus. He was d/c home with sling and told to f/u with orthopedics. He states that he called Dr. Hilda Lias who would not see him. He denies any fevers. He has had an ace wrap applied.   Past Medical History  Diagnosis Date  . Hypercholesteremia   . Substance abuse     alcohol, nicotine, h/o coacaine and marijuana    Past Surgical History  Procedure Date  . Hernia repair 1961 approx    umbilical hernia  . Elbow fusion 1993    left elbow dislocation    Family History  Problem Relation Age of Onset  . Adopted: Yes    History  Substance Use Topics  . Smoking status: Current Every Day Smoker -- 1.0 packs/day    Types: Cigarettes  . Smokeless tobacco: Not on file  . Alcohol Use: 1.8 oz/week    3 Cans of beer per week     Comment: everyday      Review of Systems A complete 10 system review of systems was obtained and all systems are negative except as noted in the HPI and PMH.   Allergies  Review of patient's allergies indicates no known allergies.  Home Medications   Current Outpatient Rx  Name  Route  Sig  Dispense  Refill  . AMLODIPINE BESYLATE 5 MG PO TABS   Oral   Take 1  tablet (5 mg total) by mouth daily.   30 tablet   3   . OXYCODONE-ACETAMINOPHEN 5-325 MG PO TABS   Oral   Take 1-2 tablets by mouth every 6 (six) hours as needed for pain.   39 tablet   0   . TRAMADOL HCL 50 MG PO TABS   Oral   Take 1 tablet (50 mg total) by mouth every 8 (eight) hours as needed for pain.   42 tablet   0     BP 131/79  Pulse 88  Temp 99.1 F (37.3 C) (Oral)  Resp 18  Ht 5\' 11"  (1.803 m)  Wt 160 lb (72.576 kg)  BMI 22.32 kg/m2  SpO2 99%  Physical Exam  Nursing note and vitals reviewed. Constitutional: He is oriented to person, place, and time. He appears well-developed and well-nourished.  HENT:  Head: Normocephalic and atraumatic.  Eyes: Conjunctivae normal and EOM are normal. Pupils are equal, round, and reactive to light.  Neck: Normal range of motion. Neck supple.  Cardiovascular: Normal rate, regular rhythm and normal heart sounds.   No murmur heard. Pulmonary/Chest: Effort normal and breath sounds normal. No respiratory distress. He has no wheezes.  Abdominal: Soft. Bowel sounds  are normal. He exhibits no distension. There is no tenderness.  Musculoskeletal: Normal range of motion. He exhibits edema. He exhibits no tenderness.       On distal half of left humerus, warm, erythematous and edematous.   Neurological: He is alert and oriented to person, place, and time.  Skin: Skin is warm and dry.  Psychiatric: He has a normal mood and affect.    ED Course  Procedures (including critical care time)  DIAGNOSTIC STUDIES: Oxygen Saturation is 99% on room air, normal by my interpretation.    COORDINATION OF CARE:  11:17-Discussed planned course of treatment with the patient including an x-ray of left elbow and blood work, who is agreeable at this time.   Results for orders placed during the hospital encounter of 06/02/12  CBC WITH DIFFERENTIAL      Component Value Range   WBC 14.1 (*) 4.0 - 10.5 K/uL   RBC 3.64 (*) 4.22 - 5.81 MIL/uL    Hemoglobin 11.9 (*) 13.0 - 17.0 g/dL   HCT 62.1 (*) 30.8 - 65.7 %   MCV 98.6  78.0 - 100.0 fL   MCH 32.7  26.0 - 34.0 pg   MCHC 33.1  30.0 - 36.0 g/dL   RDW 84.6  96.2 - 95.2 %   Platelets 222  150 - 400 K/uL   Neutrophils Relative 76  43 - 77 %   Neutro Abs 10.7 (*) 1.7 - 7.7 K/uL   Lymphocytes Relative 13  12 - 46 %   Lymphs Abs 1.8  0.7 - 4.0 K/uL   Monocytes Relative 9  3 - 12 %   Monocytes Absolute 1.2 (*) 0.1 - 1.0 K/uL   Eosinophils Relative 3  0 - 5 %   Eosinophils Absolute 0.4  0.0 - 0.7 K/uL   Basophils Relative 0  0 - 1 %   Basophils Absolute 0.0  0.0 - 0.1 K/uL  BASIC METABOLIC PANEL      Component Value Range   Sodium 137  135 - 145 mEq/L   Potassium 4.1  3.5 - 5.1 mEq/L   Chloride 98  96 - 112 mEq/L   CO2 28  19 - 32 mEq/L   Glucose, Bld 107 (*) 70 - 99 mg/dL   BUN 9  6 - 23 mg/dL   Creatinine, Ser 8.41  0.50 - 1.35 mg/dL   Calcium 9.9  8.4 - 32.4 mg/dL   GFR calc non Af Amer >90  >90 mL/min   GFR calc Af Amer >90  >90 mL/min    Dg Elbow Complete Left  06/02/2012  *RADIOLOGY REPORT*  Clinical Data: Redness and swelling posterior to elbow.  Prior surgery.  LEFT ELBOW - COMPLETE 3+ VIEW  Comparison: 05/21/2012.  Findings: Prior surgery involving the left level.  The screw which was placed for fusion of the proximal ulna/humerus is fractured. Lucency along the fractured screw suggesting loosening or infection.  Fracture of the distal humerus.  There is widening of the fracture site when compared to the recent plain film examination.  Fracture of the leads associated with the implanted stimulating device.  Soft tissue prominence.  Given the history of redness, infection is a possibility whether related to cellulitis and / or osteomyelitis.  Marked irregularity with bony overgrowth and fragmentation at the fused joints.  Radial head has been resected.  IMPRESSION: Widening of the distal left humeral fracture site when compared to the recent exam.  Fractured screw with  loosening or infection.  Findings  raise possibility of cellulitis/osteomyelitis.  Fragmentation of the inserted stimulating device lead.   Original Report Authenticated By: Lacy Duverney, M.D.      No diagnosis found.  CRITICAL CARE Performed by: Donnetta Hutching   Total critical care time: 30  Critical care time was exclusive of separately billable procedures and treating other patients.  Critical care was necessary to treat or prevent imminent or life-threatening deterioration.  Critical care was time spent personally by me on the following activities: development of treatment plan with patient and/or surrogate as well as nursing, discussions with consultants, evaluation of patient's response to treatment, examination of patient, obtaining history from patient or surrogate, ordering and performing treatments and interventions, ordering and review of laboratory studies, ordering and review of radiographic studies, pulse oximetry and re-evaluation of patient's condition.  MDM  Status post fusion left elbow 35 years ago. Now with new fracture  distal left humerus approximately 7-10 days ago.  Patient returns to the emergency department today with increased pain and swelling especially the distal humerus area.  Exam reveals an erythematous edematous distal humerus. Suspect cellulitis versus osteomyelitis.  IV vancomycin given.  Initially discussed with hand surgeon through [facility] Dr. Melvyn Novas.  He deferred patient to [facility]..  Discussed with Dr. Cristopher Peru. She will accept transfer.   I personally performed the services described in this documentation, which was scribed in my presence. The recorded information has been reviewed and is accurate.       Donnetta Hutching, MD 06/02/12 [phone]

## 2012-06-02 NOTE — ED Notes (Signed)
Patient left ED at this time with [facility] for transport to [facility]. NAD noted upon departure.

## 2012-06-02 NOTE — Assessment & Plan Note (Signed)
Hyperlipidemia:Low fat diet discussed and encouraged.  No meds due to elevated liver enzyme

## 2012-06-02 NOTE — Assessment & Plan Note (Signed)
unchanged Patient counseled for approximately 5 minutes regarding the health risks of ongoing nicotine use, specifically all types of cancer, heart disease, stroke and respiratory failure. The options available for help with cessation ,the behavioral changes to assist the process, and the option to either gradully reduce usage  Or abruptly stop.is also discussed. Pt is also encouraged to set specific goals in number of cigarettes used daily, as well as to set a quit date.  

## 2012-06-02 NOTE — ED Notes (Signed)
Pt was seen here for left elbow fracture. The left elbow is swollen, red and warm to touch. Ace wrap removed.

## 2012-06-02 NOTE — ED Notes (Signed)
Pt fell and was told that he broke his left elbow. Was told to follow up with dr. Romeo Apple, stated that he has not because he was not sure where the office was. Left elbow had ace wrap in place, loosely wrapped, large amount of swelling noted to elbow area. Denies any pain, +cap refill, moves fingers on command, will not bend elbow due to pain.

## 2012-06-02 NOTE — Assessment & Plan Note (Signed)
Uncontrolled, pt to start medication prescribed. DASH diet and commitment to daily physical activity for a minimum of 30 minutes discussed and encouraged, as a part of hypertension management. The importance of attaining a healthy weight is also discussed.

## 2012-06-03 ENCOUNTER — Telehealth: Payer: Self-pay | Admitting: Family Medicine

## 2012-06-03 NOTE — Telephone Encounter (Signed)
I am aware and am happy about that

## 2012-06-03 NOTE — Telephone Encounter (Signed)
Noted  

## 2012-06-24 DIAGNOSIS — L0291 Cutaneous abscess, unspecified: Secondary | ICD-10-CM | POA: Diagnosis not present

## 2012-06-24 DIAGNOSIS — L0390 Cellulitis, unspecified: Secondary | ICD-10-CM | POA: Diagnosis not present

## 2012-06-27 ENCOUNTER — Ambulatory Visit: Payer: Medicare Other | Admitting: Family Medicine

## 2012-08-05 ENCOUNTER — Encounter: Payer: Self-pay | Admitting: Family Medicine

## 2012-08-05 ENCOUNTER — Ambulatory Visit (INDEPENDENT_AMBULATORY_CARE_PROVIDER_SITE_OTHER): Payer: Medicare Other | Admitting: Family Medicine

## 2012-08-05 VITALS — BP 140/90 | HR 83 | Resp 18 | Ht 71.0 in | Wt 159.0 lb

## 2012-08-05 DIAGNOSIS — S42309A Unspecified fracture of shaft of humerus, unspecified arm, initial encounter for closed fracture: Secondary | ICD-10-CM

## 2012-08-05 DIAGNOSIS — R748 Abnormal levels of other serum enzymes: Secondary | ICD-10-CM

## 2012-08-05 DIAGNOSIS — Z13220 Encounter for screening for lipoid disorders: Secondary | ICD-10-CM

## 2012-08-05 DIAGNOSIS — F1020 Alcohol dependence, uncomplicated: Secondary | ICD-10-CM

## 2012-08-05 DIAGNOSIS — R7401 Elevation of levels of liver transaminase levels: Secondary | ICD-10-CM

## 2012-08-05 DIAGNOSIS — I10 Essential (primary) hypertension: Secondary | ICD-10-CM

## 2012-08-05 DIAGNOSIS — E785 Hyperlipidemia, unspecified: Secondary | ICD-10-CM

## 2012-08-05 DIAGNOSIS — F17200 Nicotine dependence, unspecified, uncomplicated: Secondary | ICD-10-CM

## 2012-08-05 LAB — HEPATIC FUNCTION PANEL
ALT: 26 U/L (ref 0–53)
AST: 55 U/L — ABNORMAL HIGH (ref 0–37)
Albumin: 4.8 g/dL (ref 3.5–5.2)
Alkaline Phosphatase: 97 U/L (ref 39–117)
Total Bilirubin: 1 mg/dL (ref 0.3–1.2)
Total Protein: 8.2 g/dL (ref 6.0–8.3)

## 2012-08-05 LAB — LIPID PANEL
Cholesterol: 261 mg/dL — ABNORMAL HIGH (ref 0–200)
HDL: 99 mg/dL (ref >39)
Triglycerides: 86 mg/dL (ref <150)

## 2012-08-05 MED ORDER — AMLODIPINE BESYLATE 2.5 MG PO TABS: 2.5000 mg | ORAL_TABLET | Freq: Every day | ORAL | Status: DC

## 2012-08-05 NOTE — Assessment & Plan Note (Signed)
Uncontrolled, resume medication

## 2012-08-05 NOTE — Assessment & Plan Note (Addendum)
Fractured left humerus 05/2012. Required hospitalization at [facility] due to infection in the joint. Now healed, however c/o left elbow stiffness, will refer for PT

## 2012-08-05 NOTE — Assessment & Plan Note (Signed)
Hyperlipidemia:Low fat diet discussed and encouraged.  Updated lab needed. ABN LFT's in the past , due mainly to alcohol, will rept lab to help determine mangement

## 2012-08-05 NOTE — Assessment & Plan Note (Addendum)
Ongoing use admits to being intoxicated when he fractured his elbow. Cessation  counseling done

## 2012-08-05 NOTE — Assessment & Plan Note (Signed)

## 2012-08-05 NOTE — Patient Instructions (Addendum)
F/u in 4 month  Please take  Medication for blood pressure.  You need to sto-p smoking and drinking alcohol for your own safety and health  You are referred to physical therapy for left elbow stiffness following fracture  Labs today, lid and hepatic   Please sign for d/c summary iinfo from [facility]

## 2012-08-05 NOTE — Progress Notes (Signed)
  Subjective:    Patient ID: Wesley Brennan, male    DOB: 1960/10/05, 52 y.o.   MRN: [id number]  HPI The PT is here for follow up and re-evaluation of chronic medical conditions, medication management and review of any available recent lab and radiology data.  Preventive health is updated, specifically  Cancer screening and Immunization.   Questions or concerns regarding consultations or procedures which the PT has had in the interim are  Addressed.Was hospitalized for 3 days in November for management of infected fractured left elbow, antibiotics only now c/o stiffness in the joint and wants help for this The PT denies any adverse reactions to current medications since the last visit.  Reports less alcohol and nicotine use currently       Review of Systems See HPI Denies recent fever or chills. Denies sinus pressure, nasal congestion, ear pain or sore throat. Denies chest congestion, productive cough or wheezing. Denies chest pains, palpitations and leg swelling Denies abdominal pain, nausea, vomiting,diarrhea or constipation.   Denies dysuria, frequency, hesitancy or incontinence. Denies headaches, seizures, numbness, or tingling. Denies depression, anxiety or insomnia. Denies skin break down or rash.        Objective:   Physical Exam Patient alert and oriented and in no cardiopulmonary distress.  HEENT: No facial asymmetry, EOMI, no sinus tenderness,  oropharynx pink and moist.  Neck decreased ROM no adenopathy.  Chest: Clear to auscultation bilaterally.Decreased air entry throughout  CVS: S1, S2 no murmurs, no S3.  ABD: Soft non tender. Bowel sounds normal.  Ext: No edema  MS: Adequate ROM spine, shoulders, hips and knees.  Skin: Intact, no ulcerations or rash noted.  Psych: Good eye contact, normal affect. Memory iloss not anxious or depressed appearing.  CNS: CN 2-12 intact,         Assessment & Plan:

## 2012-08-06 ENCOUNTER — Telehealth: Payer: Self-pay | Admitting: Family Medicine

## 2012-08-06 NOTE — Telephone Encounter (Signed)
Patient is aware 

## 2012-08-07 ENCOUNTER — Ambulatory Visit (HOSPITAL_COMMUNITY): Payer: Medicare Other | Admitting: Physical Therapy

## 2012-08-07 ENCOUNTER — Ambulatory Visit (HOSPITAL_COMMUNITY)
Admission: RE | Admit: 2012-08-07 | Discharge: 2012-08-07 | Disposition: A | Discharge status: Home / Self Care | Payer: Medicare Other | Source: Ambulatory Visit | Attending: Family Medicine | Admitting: Family Medicine

## 2012-08-07 DIAGNOSIS — M25639 Stiffness of unspecified wrist, not elsewhere classified: Secondary | ICD-10-CM | POA: Diagnosis not present

## 2012-08-07 DIAGNOSIS — IMO0001 Reserved for inherently not codable concepts without codable children: Secondary | ICD-10-CM | POA: Insufficient documentation

## 2012-08-07 DIAGNOSIS — M25529 Pain in unspecified elbow: Secondary | ICD-10-CM | POA: Insufficient documentation

## 2012-08-07 DIAGNOSIS — M25522 Pain in left elbow: Secondary | ICD-10-CM | POA: Insufficient documentation

## 2012-08-07 DIAGNOSIS — I10 Essential (primary) hypertension: Secondary | ICD-10-CM | POA: Diagnosis not present

## 2012-08-07 DIAGNOSIS — M6281 Muscle weakness (generalized): Secondary | ICD-10-CM | POA: Insufficient documentation

## 2012-08-07 NOTE — Evaluation (Signed)
Occupational Therapy Evaluation  Patient Details  Name: Wesley Brennan MRN: [id number] Date of Birth: [birth year]  Today's Date: 08/07/2012 Time: 1478-2956 OT Time Calculation (min): 40 min  Visit#: 1  of 12   Re-eval: 09/04/12  Assessment Diagnosis: R humerus fx Next MD Visit: in May Prior Therapy: At [facility]  Authorization: Medicare  Authorization Time Period: before 10th visit  Authorization Visit#: 1  of 10    Past Medical History:  Past Medical History  Diagnosis Date  . Hypercholesteremia   . Substance abuse     alcohol, nicotine, h/o coacaine and marijuana   Past Surgical History:  Past Surgical History  Procedure Date  . Hernia repair 1961 approx    umbilical hernia  . Elbow fusion 1993    left elbow dislocation    Subjective Symptoms/Limitations Symptoms: S: Wesley Brennan is a 52 y/o patient presenting to outpatient occupational therapy with pain and stiffness in left elbow s/p left humerus fx. Dr. Lodema Hong is referring patient for occupational therapy eval and treat. Pertinent History: Wesley Brennan experienced a left humerus fx in [place] from a fall. Fx has healed but patient is experiencing pain in stiffness in elbow. Patient has had an elbow fusion from a previous injury in '79. Wesley Brennan is experiencing difficulty with pain, decreased ROM, and difficulty with daily tasks. Limitations: washing dishes, raking leaves, dishes, cooking, bathing, yardwork, washing car. Special Tests: UEFI: 27/44 = 61% I 39%impairment  Patient Stated Goals: To decrease pain and stiffness in left elbow. Pain Assessment Currently in Pain?: Yes Pain Score:   2 Pain Location: Elbow Pain Orientation: Left Pain Type: Acute pain  Precautions/Restrictions  Precautions Precautions: None  Prior Functioning  Home Living Lives With: Alone Prior Function Vocation: Part time employment Vocation Requirements: works for an [employer]. yardwork, cleaning, wash  car  Assessment ADL/Vision/Perception ADL ADL Comments: Difficulty with bathing, housekeeping tasks.   Additional Assessments RUE Assessment RUE Assessment:  (Pro/supination; wrist flex/ext WFL by observation) RUE AROM (degrees) Right Shoulder Flexion: 150 Degrees Right Shoulder ABduction: 140 Degrees Right Shoulder Internal Rotation: 55 Degrees Right Shoulder External Rotation: 64 Degrees RUE Strength Right Shoulder Flexion: 4/5 Right Shoulder ABduction: 4/5 Right Shoulder Internal Rotation: 4/5 Right Shoulder External Rotation: 4/5 Right Elbow Flexion: 4/5 Right Elbow Extension: 4/5 Grip (lbs): 85 LUE Assessment LUE Assessment:  (Pro/supination; wrist flex/ext WNL by observation) LUE AROM (degrees) Left Shoulder Flexion: 140 Degrees Left Shoulder ABduction: 130 Degrees Left Shoulder Internal Rotation: 55 Degrees Left Shoulder External Rotation: 60 Degrees Left Elbow Flexion: 52  Left Elbow Extension:  (+32 degrees) LUE Strength Left Shoulder Flexion: 4/5 Left Shoulder Extension: 4/5 Left Shoulder ABduction: 4/5 Left Shoulder Internal Rotation: 3/5 Left Shoulder External Rotation: 3/5 Left Elbow Flexion: 3-/5 (baseline) Left Elbow Extension: 3-/5 (baseline) Grip (lbs): 60 Palpation Palpation: Moderate amount of edema and restrictions in elbow region      Occupational Therapy Assessment and Plan OT Assessment and Plan Clinical Impression Statement: A: Wesley Brennan is presenting to outpatient occupational therapy with deficits in ADL performance, pain, and limited AROM and will benefit from OT services. Pt will benefit from skilled therapeutic intervention in order to improve on the following deficits: Decreased range of motion;Decreased strength;Increased fascial restricitons;Increased edema;Impaired flexibility;Impaired UE functional use;Pain Rehab Potential: Good OT Frequency: Min 2X/week OT Duration: 6 weeks OT Treatment/Interventions: Self-care/ADL  training;Therapeutic exercise;Energy conservation;Therapeutic activities;Manual therapy;Patient/family education OT Plan: P:   Skilled OT intervention is indicated to decrease pain and restrictions and increase A/PROM and strength needed to use LUE  with all daily activities.    Goals Short Term Goals Time to Complete Short Term Goals: 4 weeks Short Term Goal 1: Patient will be educated on HEP. Short Term Goal 2: Patient will increase AROM of elbow by 10% in order to complete work tasks of raking leaves. Short Term Goal 3: Patient will decrease pain in elbow to 1/10 in order to perform daily tasks without difficulty. Short Term Goal 4: Patient will increase grip strength by 5lbs in order to open and close containers. Long Term Goals Time to Complete Long Term Goals: 6 weeks Long Term Goal 1: Patient will return to prior level of function with leisure, work, and daily tasks. Long Term Goal 2: Patient will increase AROM of elbow by 25% in order to complete work tasks such as washing a car. Long Term Goal 3: Patient will decrease pain level in elbow to 0/10 in order to perform daily tasks without difficulty. Long Term Goal 4: Patient will increase grip strength by 10lbs in order to wash dishes without difficulty.   Problem List Patient Active Problem List  Diagnosis  . HYPERLIPIDEMIA  . ALCOHOLISM  . NICOTINE ADDICTION  . MENTAL RETARDATION  . Routine general medical examination at a health care facility  . Humerus fracture  . HTN (hypertension), benign  . Pain in joint, forearm  . Muscle weakness (generalized)    End of Session Activity Tolerance: Patient tolerated treatment well General Behavior During Session: [facility] for tasks performed Cognition: [facility] for tasks performed OT Plan of Care OT Home Exercise Plan: AROM exercises - wrist and elbow OT Patient Instructions: handout Consulted and Agree with Plan of Care: Patient  GO Functional Assessment Tool Used: UEFI Functional  Limitation: Carrying, moving and handling objects Carrying, Moving and Handling Objects Current Status (N5621): At least 20 percent but less than 40 percent impaired, limited or restricted Carrying, Moving and Handling Objects Goal Status [phone]): At least 1 percent but less than 20 percent impaired, limited or restricted  Limmie Patricia, OTR/L 08/07/2012, 11:54 AM  Physician Documentation Your signature is required to indicate approval of the treatment plan as stated above.  Please sign and either send electronically or make a copy of this report for your files and return this physician signed original.  Please mark one 1.__approve of plan  2. ___approve of plan with the following conditions.   ______________________________                                                          _____________________ Physician Signature                                                                                                             Date

## 2012-08-09 ENCOUNTER — Ambulatory Visit (HOSPITAL_COMMUNITY)
Admission: RE | Admit: 2012-08-09 | Discharge: 2012-08-09 | Disposition: A | Discharge status: Home / Self Care | Payer: Medicare Other | Source: Ambulatory Visit | Attending: Family Medicine | Admitting: Family Medicine

## 2012-08-09 NOTE — Progress Notes (Signed)
Occupational Therapy Treatment Patient Details  Name: NATALIE LECLAIRE MRN: [id number] Date of Birth: 04-18-1961  Today's Date: 08/09/2012 Time: 1025-1100 OT Time Calculation (min): 35 min MFR 1025-1050 25' Theract 1050-1100 10'  Visit#: 2  of 12   Re-eval: 09/04/12    Authorization: Medicare  Authorization Time Period: before 10th visit  Authorization Visit#: 2  of 10   Subjective Symptoms/Limitations Symptoms: S: I've been working on my exercises at home. My elbow is so much better.  Precautions/Restrictions   none  Exercise/Treatments Elbow Exercises Elbow Flexion: PROM;AROM;10 reps Elbow Extension: PROM;AROM;10 reps Forearm Supination: PROM;AROM;10 reps Forearm Pronation: PROM;AROM;10 reps Wrist Flexion: AROM;10 reps Wrist Extension: AROM;10 reps   Hand Exercises Theraputty - Flatten: yellow Theraputty - Roll: yellow Theraputty - Grip: yellow Theraputty - Pinch: yellow     Manual Therapy Manual Therapy: Myofascial release Myofascial Release: MFR and manual stretching to left elbow region to decrease pain and increase joint mobility.  Occupational Therapy Assessment and Plan OT Assessment and Plan Clinical Impression Statement: A: Gaspare was very happy that he wasn't experiencing any pain today. He feels therapy is really helping. Pt will benefit from skilled therapeutic intervention in order to improve on the following deficits: Decreased range of motion;Decreased strength;Increased fascial restricitons;Increased edema;Impaired flexibility;Impaired UE functional use;Pain Rehab Potential: Good OT Frequency: Min 2X/week OT Duration: 6 weeks OT Treatment/Interventions: Self-care/ADL training;Therapeutic exercise;Energy conservation;Therapeutic activities;Manual therapy;Patient/family education OT Plan: P: Cont. to work on grip stengthening exercises and work on increase elbow flexion.   Goals Short Term Goals Time to Complete Short Term Goals: 4 weeks Short Term  Goal 1: Patient will be educated on HEP. Short Term Goal 1 Progress: Progressing toward goal Short Term Goal 2: Patient will increase AROM of elbow by 10% in order to complete work tasks of raking leaves. Short Term Goal 2 Progress: Progressing toward goal Short Term Goal 3: Patient will decrease pain in elbow to 1/10 in order to perform daily tasks without difficulty. Short Term Goal 3 Progress: Progressing toward goal Short Term Goal 4: Patient will increase grip strength by 5lbs in order to open and close containers. Short Term Goal 4 Progress: Progressing toward goal Long Term Goals Time to Complete Long Term Goals: 6 weeks Long Term Goal 1: Patient will return to prior level of function with leisure, work, and daily tasks. Long Term Goal 1 Progress: Progressing toward goal Long Term Goal 2: Patient will increase AROM of elbow by 25% in order to complete work tasks such as washing a car. Long Term Goal 2 Progress: Progressing toward goal Long Term Goal 3: Patient will decrease pain level in elbow to 0/10 in order to perform daily tasks without difficulty. Long Term Goal 3 Progress: Progressing toward goal Long Term Goal 4: Patient will increase grip strength by 10lbs in order to wash dishes without difficulty.  Long Term Goal 4 Progress: Progressing toward goal  Problem List Patient Active Problem List  Diagnosis  . HYPERLIPIDEMIA  . ALCOHOLISM  . NICOTINE ADDICTION  . MENTAL RETARDATION  . Routine general medical examination at a health care facility  . Humerus fracture  . HTN (hypertension), benign  . Pain in joint, forearm  . Muscle weakness (generalized)    End of Session Activity Tolerance: Patient tolerated treatment well General Behavior During Session: [facility] for tasks performed Cognition: [facility] for tasks performed   Limmie Patricia, OTR/L 08/09/2012, 11:02 AM

## 2012-08-14 ENCOUNTER — Ambulatory Visit (HOSPITAL_COMMUNITY)
Admission: RE | Admit: 2012-08-14 | Discharge: 2012-08-14 | Disposition: A | Discharge status: Home / Self Care | Payer: Medicare Other | Source: Ambulatory Visit | Attending: Family Medicine | Admitting: Family Medicine

## 2012-08-14 NOTE — Progress Notes (Signed)
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 It hurt my elbow.  Precautions/Restrictions  Precautions Precautions: None  Exercise/Treatments Elbow Exercises Elbow Flexion: PROM;AROM;10 reps Elbow Extension: PROM;AROM;10 reps Forearm Supination: PROM;AROM;10 reps Forearm Pronation: PROM;AROM;10 reps Wrist Flexion: AROM;10 reps Wrist Extension: AROM;10 reps   Hand Exercises Hand Gripper with Medium Beads: x10 with black gripper Sponges: 13,15,18     Modalities Modalities: Cryotherapy Manual Therapy Manual Therapy: Massage Massage: Massage and manual stretching to left elbow region (lateral epicondylitis) to decrease pain and increase joint mobility.  Cryotherapy Number Minutes Cryotherapy: 17 Minutes Cryotherapy Location: Other (comment) (left elbow) Type of Cryotherapy: Ice pack  Occupational Therapy Assessment and Plan OT Assessment and Plan Clinical Impression Statement: A: Didn't work on theraputty this tx session due to increased soreness in elbow from heavy lfting at work.  OT Plan: P: Cont. to work on grip stengthening exercises and work on increase elbow flexion.   Goals Short Term Goals Time to Complete Short Term Goals: 4 weeks Short Term Goal 1: Patient will be educated on HEP. Short Term Goal 1 Progress: Met Short Term Goal 2: Patient will increase AROM of elbow by 10% in order to complete work tasks of raking leaves. Short Term Goal 2 Progress: Progressing toward goal Short Term Goal 3: Patient will  decrease pain in elbow to 1/10 in order to perform daily tasks without difficulty. Short Term Goal 3 Progress: Progressing toward goal Short Term Goal 4: Patient will increase grip strength by 5lbs in order to open and close containers. Short Term Goal 4 Progress: Progressing toward goal Long Term Goals Time to Complete Long Term Goals: 6 weeks Long Term Goal 1: Patient will return to prior level of function with leisure, work, and daily tasks. Long Term Goal 1 Progress: Progressing toward goal Long Term Goal 2: Patient will increase AROM of elbow by 25% in order to complete work tasks such as washing a car. Long Term Goal 2 Progress: Progressing toward goal Long Term Goal 3: Patient will decrease pain level in elbow to 0/10 in order to perform daily tasks without difficulty. Long Term Goal 3 Progress: Progressing toward goal Long Term Goal 4: Patient will increase grip strength by 10lbs in order to wash dishes without difficulty.  Long Term Goal 4 Progress: Progressing toward goal  Problem List Patient Active Problem List  Diagnosis  . HYPERLIPIDEMIA  . ALCOHOLISM  . NICOTINE ADDICTION  . MENTAL RETARDATION  . Routine general medical examination at a health care facility  . Humerus fracture  . HTN (hypertension), benign  . Pain in joint, forearm  . Muscle weakness (generalized)    End of Session Activity Tolerance: Patient tolerated treatment well General Behavior During Session: [facility] for tasks performed Cognition: [facility] for tasks performed   Limmie Patricia, OTR/L 08/14/2012, 11:05 AM

## 2012-08-14 NOTE — Addendum Note (Signed)
Addended by: Kandis Fantasia B on: 08/14/2012 02:46 PM   Modules accepted: Orders

## 2012-08-16 ENCOUNTER — Ambulatory Visit (HOSPITAL_COMMUNITY)
Admission: RE | Admit: 2012-08-16 | Discharge: 2012-08-16 | Disposition: A | Discharge status: Home / Self Care | Payer: Medicare Other | Source: Ambulatory Visit | Attending: Family Medicine | Admitting: Family Medicine

## 2012-08-16 NOTE — Progress Notes (Signed)
Occupational Therapy Treatment Patient Details  Name: Wesley Brennan MRN: [id number] Date of Birth: 04-25-1961  Today's Date: 08/16/2012 Time: 1020-1101 OT Time Calculation (min): 41 min Massage 1020-1030 10' Therex 1030-1101 31'  Visit#: 4  of 12   Re-eval: 09/04/12    Authorization: Medicare  Authorization Time Period: before 10th visit  Authorization Visit#: 4  of 10   Subjective Symptoms/Limitations Symptoms: S: My elbow feels real good. I've been taking it easy since my boss is on vacation. Pain Assessment Currently in Pain?: No/denies  Precautions/Restrictions   none  Exercise/Treatments Elbow Exercises Elbow Flexion: PROM;AROM;10 reps Elbow Extension: PROM;AROM;10 reps Forearm Supination: PROM;AROM;10 reps Forearm Pronation: PROM;AROM;10 reps Wrist Flexion: AROM;10 reps Wrist Extension: AROM;10 reps   UBE (Upper Arm Bike): 3' forward 3' reverse 2.0 Theraputty - Flatten: yellow Theraputty - Roll: yellow Theraputty - Grip: yellow Hand Gripper with Medium Beads: x10 with black gripper Hand Gripper with Small Beads: x10 with black gripper     Manual Therapy Manual Therapy: Massage Massage: Massage and manual stretching to left elbow region (lateral epicondylitis) to decrease pain and increase joint mobility.   Occupational Therapy Assessment and Plan OT Assessment and Plan Clinical Impression Statement: A; Swelling decreased in left elbow. PROM elbow flexion has improved: 79 degrees.  OT Plan: P: Cont. to work on grip stengthening exercises and work on increase elbow flexion.   Goals Short Term Goals Time to Complete Short Term Goals: 4 weeks Short Term Goal 1: Patient will be educated on HEP. Short Term Goal 2: Patient will increase AROM of elbow by 10% in order to complete work tasks of raking leaves. Short Term Goal 3: Patient will decrease pain in elbow to 1/10 in order to perform daily tasks without difficulty. Short Term Goal 4: Patient will increase  grip strength by 5lbs in order to open and close containers. Long Term Goals Time to Complete Long Term Goals: 6 weeks Long Term Goal 1: Patient will return to prior level of function with leisure, work, and daily tasks. Long Term Goal 2: Patient will increase AROM of elbow by 25% in order to complete work tasks such as washing a car. Long Term Goal 3: Patient will decrease pain level in elbow to 0/10 in order to perform daily tasks without difficulty. Long Term Goal 4: Patient will increase grip strength by 10lbs in order to wash dishes without difficulty.   Problem List Patient Active Problem List  Diagnosis  . HYPERLIPIDEMIA  . ALCOHOLISM  . NICOTINE ADDICTION  . MENTAL RETARDATION  . Routine general medical examination at a health care facility  . Humerus fracture  . HTN (hypertension), benign  . Pain in joint, forearm  . Muscle weakness (generalized)    End of Session Activity Tolerance: Patient tolerated treatment well General Behavior During Session: [facility] for tasks performed Cognition: [facility] for tasks performed   Limmie Patricia, OTR/L 08/16/2012, 11:28 AM

## 2012-08-21 ENCOUNTER — Ambulatory Visit (HOSPITAL_COMMUNITY)
Admission: RE | Admit: 2012-08-21 | Discharge: 2012-08-21 | Disposition: A | Discharge status: Home / Self Care | Payer: Medicare Other | Source: Ambulatory Visit | Attending: Family Medicine | Admitting: Family Medicine

## 2012-08-21 DIAGNOSIS — I10 Essential (primary) hypertension: Secondary | ICD-10-CM | POA: Insufficient documentation

## 2012-08-21 DIAGNOSIS — M25529 Pain in unspecified elbow: Secondary | ICD-10-CM | POA: Insufficient documentation

## 2012-08-21 DIAGNOSIS — M25639 Stiffness of unspecified wrist, not elsewhere classified: Secondary | ICD-10-CM | POA: Diagnosis not present

## 2012-08-21 DIAGNOSIS — IMO0001 Reserved for inherently not codable concepts without codable children: Secondary | ICD-10-CM | POA: Diagnosis not present

## 2012-08-21 NOTE — Progress Notes (Signed)
Occupational Therapy Treatment Patient Details  Name: Wesley Brennan MRN: [id number] Date of Birth: 1960-08-07  Today's Date: 08/21/2012 Time: 1020-1059 OT Time Calculation (min): 39 min Manual Therapy 1020-1032 12' Therapeutic Exercises [phone] 27' Visit#: 5  of 12   Re-eval: 09/04/12    Authorization: Medicare  Authorization Time Period: before 10th visit  Authorization Visit#: 5  of 10   Subjective S:  This elbow is doing wonderful.  Im even getting some feeling back in it! Pain Assessment Currently in Pain?: No/denies Pain Score: 0-No pain  Precautions/Restrictions   progress as tolerated  Exercise/Treatments Elbow Exercises Elbow Flexion: PROM;AROM;10 reps Elbow Extension: PROM;AROM;10 reps Forearm Supination: PROM;AROM;10 reps Forearm Pronation: PROM;AROM;10 reps Wrist Flexion: AROM;10 reps Wrist Extension: AROM;10 reps Other elbow exercises: ball stretches forward x 25 to improve elbow extension Other elbow exercises: wall wash x 1'   UBE (Upper Arm Bike): 3' forward 3' reverse 2.0 Sponges: 20, 17, 22 Theraputty - Flatten: omit today Theraputty - Roll: yellow Theraputty - Grip: yellow Hand Gripper with Large Beads: x 5 with black gripper Hand Gripper with Medium Beads: x 5 with black gripper Hand Gripper with Small Beads: able to grasp one with black gripper, then fatigued and unable to complete remaining 4, recommended completing first prior to large and medium     Manual Therapy Manual Therapy: Myofascial release Myofascial Release: MFR and manual stretching to left elbow region to decrease pain and increase joint mobility.   Occupational Therapy Assessment and Plan OT Assessment and Plan Clinical Impression Statement: A:  Added wall wash and ball stretch to improve elbow flexion/extension OT Plan: P:  Add large pegboard on wall for increased elbow flexion and extension.  Complete small beads with hand gripper prior to medium and large beads.    Goals Short Term Goals Time to Complete Short Term Goals: 4 weeks Short Term Goal 1: Patient will be educated on HEP. Short Term Goal 1 Progress: Met Short Term Goal 2: Patient will increase AROM of elbow by 10% in order to complete work tasks of raking leaves. Short Term Goal 2 Progress: Progressing toward goal Short Term Goal 3: Patient will decrease pain in elbow to 1/10 in order to perform daily tasks without difficulty. Short Term Goal 3 Progress: Progressing toward goal Short Term Goal 4: Patient will increase grip strength by 5lbs in order to open and close containers. Short Term Goal 4 Progress: Progressing toward goal Long Term Goals Time to Complete Long Term Goals: 6 weeks Long Term Goal 1: Patient will return to prior level of function with leisure, work, and daily tasks. Long Term Goal 1 Progress: Progressing toward goal Long Term Goal 2: Patient will increase AROM of elbow by 25% in order to complete work tasks such as washing a car. Long Term Goal 2 Progress: Progressing toward goal Long Term Goal 3: Patient will decrease pain level in elbow to 0/10 in order to perform daily tasks without difficulty. Long Term Goal 3 Progress: Progressing toward goal Long Term Goal 4: Patient will increase grip strength by 10lbs in order to wash dishes without difficulty.  Long Term Goal 4 Progress: Progressing toward goal  Problem List Patient Active Problem List  Diagnosis  . HYPERLIPIDEMIA  . ALCOHOLISM  . NICOTINE ADDICTION  . MENTAL RETARDATION  . Routine general medical examination at a health care facility  . Humerus fracture  . HTN (hypertension), benign  . Pain in joint, forearm  . Muscle weakness (generalized)    General  Behavior During Session: [facility] for tasks performed Cognition: [facility] for tasks performed  GO    Shirlean Mylar, OTR/L  08/21/2012, 11:00 AM

## 2012-08-23 ENCOUNTER — Ambulatory Visit (HOSPITAL_COMMUNITY)
Admission: RE | Admit: 2012-08-23 | Discharge: 2012-08-23 | Disposition: A | Discharge status: Home / Self Care | Payer: Medicare Other | Source: Ambulatory Visit | Attending: Family Medicine | Admitting: Family Medicine

## 2012-08-23 DIAGNOSIS — I10 Essential (primary) hypertension: Secondary | ICD-10-CM | POA: Diagnosis not present

## 2012-08-23 DIAGNOSIS — M25639 Stiffness of unspecified wrist, not elsewhere classified: Secondary | ICD-10-CM | POA: Diagnosis not present

## 2012-08-23 DIAGNOSIS — IMO0001 Reserved for inherently not codable concepts without codable children: Secondary | ICD-10-CM | POA: Diagnosis not present

## 2012-08-23 DIAGNOSIS — M25529 Pain in unspecified elbow: Secondary | ICD-10-CM | POA: Diagnosis not present

## 2012-08-23 NOTE — Progress Notes (Signed)
Occupational Therapy Treatment Patient Details  Name: Wesley Brennan MRN: [id number] Date of Birth: 09-25-1960  Today's Date: 08/23/2012 Time: 4098-1191 OT Time Calculation (min): 42 min Manual Therapy 845-900 15' Therapeutic Exercises 900-927 27' Visit#: 6  of 12   Re-eval: 09/04/12    Authorization: Medicare  Authorization Time Period: before 10th visit   Authorization Visit#: 6  of 10   Subjective S:  Im getting good with washing my floors!   Pain Assessment Currently in Pain?: No/denies Pain Score: 0-No pain  Precautions/Restrictions   progress as tolerated  Exercise/Treatments Elbow Exercises Elbow Flexion: PROM;AROM;10 reps Elbow Extension: PROM;AROM;10 reps Forearm Supination: PROM;AROM;10 reps Forearm Pronation: PROM;AROM;10 reps Wrist Flexion: AROM;10 reps Wrist Extension: AROM;10 reps Other elbow exercises: ball stretches forward x 25 to improve elbow extension Other elbow exercises: wall wash x 1 1/2 minutes   UBE (Upper Arm Bike): 3' forward 3' reverse began at 2.5 but decreased to 2.0 after 4 minutes Sponges: 23, 24, 29 Theraputty - Flatten: omit today Theraputty - Roll: omit today Theraputty - Grip: omit today Hand Gripper with Large Beads: x 5 with black gripper Hand Gripper with Medium Beads: x 5 with black gripper Hand Gripper with Small Beads: x 10 increased ease this date, as began with small beads rather than ending with small  Large Pegboard: fixed on wall patient reached into bucket retrieved a large peg and placed in pegboard on wall x 30 reps, he then removed and placed in bucket which was positoned to his extreme right to promote increased elbow extension     Manual Therapy Manual Therapy: Myofascial release  Occupational Therapy Assessment and Plan OT Assessment and Plan Clinical Impression Statement: A:  Noted fascial release with MFR to elbow region this date.  Patient had increased swelling in elbow joint after completing UBE and wall  wash exercises, however, swelling dissipated prior to end of session.  Rehab Potential: Good OT Plan: P:  Resume tputty exercises for increased elbow AROM and hand strengthening.   Goals Short Term Goals Time to Complete Short Term Goals: 4 weeks Short Term Goal 1: Patient will be educated on HEP. Short Term Goal 2: Patient will increase AROM of elbow by 10% in order to complete work tasks of raking leaves. Short Term Goal 3: Patient will decrease pain in elbow to 1/10 in order to perform daily tasks without difficulty. Short Term Goal 4: Patient will increase grip strength by 5lbs in order to open and close containers. Long Term Goals Time to Complete Long Term Goals: 6 weeks Long Term Goal 1: Patient will return to prior level of function with leisure, work, and daily tasks. Long Term Goal 2: Patient will increase AROM of elbow by 25% in order to complete work tasks such as washing a car. Long Term Goal 3: Patient will decrease pain level in elbow to 0/10 in order to perform daily tasks without difficulty. Long Term Goal 4: Patient will increase grip strength by 10lbs in order to wash dishes without difficulty.   Problem List Patient Active Problem List  Diagnosis  . HYPERLIPIDEMIA  . ALCOHOLISM  . NICOTINE ADDICTION  . MENTAL RETARDATION  . Routine general medical examination at a health care facility  . Humerus fracture  . HTN (hypertension), benign  . Pain in joint, forearm  . Muscle weakness (generalized)    End of Session Activity Tolerance: Patient tolerated treatment well General Behavior During Session: [facility] for tasks performed Cognition: [facility] for tasks performed  [facility]  Esmond Harps, OTR/L   08/23/2012, 9:35 AM

## 2012-08-28 ENCOUNTER — Ambulatory Visit (HOSPITAL_COMMUNITY): Payer: Medicare Other

## 2012-08-28 LAB — HEPATIC FUNCTION PANEL
Albumin: 4.5 g/dL (ref 3.5–5.2)
Alkaline Phosphatase: 68 U/L (ref 39–117)
Total Protein: 7.7 g/dL (ref 6.0–8.3)

## 2012-08-30 ENCOUNTER — Ambulatory Visit (HOSPITAL_COMMUNITY): Payer: Medicare Other | Admitting: Specialist

## 2012-09-04 ENCOUNTER — Ambulatory Visit (HOSPITAL_COMMUNITY)
Admission: RE | Admit: 2012-09-04 | Discharge: 2012-09-04 | Disposition: A | Discharge status: Home / Self Care | Payer: Medicare Other | Source: Ambulatory Visit | Attending: Family Medicine | Admitting: Family Medicine

## 2012-09-04 DIAGNOSIS — M25639 Stiffness of unspecified wrist, not elsewhere classified: Secondary | ICD-10-CM | POA: Diagnosis not present

## 2012-09-04 DIAGNOSIS — IMO0001 Reserved for inherently not codable concepts without codable children: Secondary | ICD-10-CM | POA: Diagnosis not present

## 2012-09-04 DIAGNOSIS — I10 Essential (primary) hypertension: Secondary | ICD-10-CM | POA: Diagnosis not present

## 2012-09-04 DIAGNOSIS — M25529 Pain in unspecified elbow: Secondary | ICD-10-CM | POA: Diagnosis not present

## 2012-09-04 NOTE — Progress Notes (Signed)
Occupational Therapy Treatment Patient Details  Name: Wesley Brennan MRN: [id number] Date of Birth: 11-25-1960  Today's Date: 09/04/2012 Time: 4098-1191 OT Time Calculation (min): 18 min Re-assess [phone] 18'  Visit#: 7 of 12  Re-eval:      Authorization: Medicare  Authorization Time Period: before 10th visit   Authorization Visit#: 7 of 10  Subjective Symptoms/Limitations Symptoms: S: My elbow is completely back to normal! I don't need therapy any more! You both have dont amazing work! Pain Assessment Currently in Pain?: No/denies  Precautions/Restrictions   Progress as tolerated.   Occupational Therapy Assessment and Plan OT Assessment and Plan Clinical Impression Statement: A: Patient reports that he has back to normal and has no pain. Measurements were taken and goals reviewed. Patient has met goals and will be discharged from therapy. OT Plan: P: D/C from therapy.   Goals Short Term Goals Time to Complete Short Term Goals: 4 weeks Short Term Goal 1: Patient will be educated on HEP. Short Term Goal 2: Patient will increase AROM of elbow by 10% in order to complete work tasks of raking leaves. Short Term Goal 2 Progress: Met Short Term Goal 3: Patient will decrease pain in elbow to 1/10 in order to perform daily tasks without difficulty. Short Term Goal 3 Progress: Met Short Term Goal 4: Patient will increase grip strength by 5lbs in order to open and close containers. Short Term Goal 4 Progress: Met Long Term Goals Time to Complete Long Term Goals: 6 weeks Long Term Goal 1: Patient will return to prior level of function with leisure, work, and daily tasks. Long Term Goal 1 Progress: Met Long Term Goal 2: Patient will increase AROM of elbow by 25% in order to complete work tasks such as washing a car. Long Term Goal 2 Progress: Met Long Term Goal 3: Patient will decrease pain level in elbow to 0/10 in order to perform daily tasks without difficulty. Long Term Goal  3 Progress: Met Long Term Goal 4: Patient will increase grip strength by 10lbs in order to wash dishes without difficulty.  Long Term Goal 4 Progress: Partly met  Problem List Patient Active Problem List  Diagnosis  . HYPERLIPIDEMIA  . ALCOHOLISM  . NICOTINE ADDICTION  . MENTAL RETARDATION  . Routine general medical examination at a health care facility  . Humerus fracture  . HTN (hypertension), benign  . Pain in joint, forearm  . Muscle weakness (generalized)    End of Session Activity Tolerance: Patient tolerated treatment well General Behavior During Session: [facility] for tasks performed Cognition: [facility] for tasks performed  GO Functional Assessment Tool Used: UEFI 80/80 = 100% independent. Functional Limitation: Carrying, moving and handling objects Carrying, Moving and Handling Objects Current Status [phone]): 0 percent impaired, limited or restricted Carrying, Moving and Handling Objects Goal Status ([id number]): At least 1 percent but less than 20 percent impaired, limited or restricted Carrying, Moving and Handling Objects Discharge Status [phone]): 0 percent impaired, limited or restricted  Limmie Patricia, OTR/L  09/04/2012, 10:54 AM

## 2012-09-06 ENCOUNTER — Ambulatory Visit (HOSPITAL_COMMUNITY): Payer: Medicare Other | Admitting: Specialist

## 2012-12-03 ENCOUNTER — Ambulatory Visit (HOSPITAL_COMMUNITY)
Admission: RE | Admit: 2012-12-03 | Discharge: 2012-12-03 | Disposition: A | Discharge status: Home / Self Care | Payer: Medicare Other | Source: Ambulatory Visit | Attending: Family Medicine | Admitting: Family Medicine

## 2012-12-03 ENCOUNTER — Ambulatory Visit (INDEPENDENT_AMBULATORY_CARE_PROVIDER_SITE_OTHER): Payer: Medicare Other | Admitting: Family Medicine

## 2012-12-03 ENCOUNTER — Encounter: Payer: Self-pay | Admitting: Family Medicine

## 2012-12-03 VITALS — BP 120/82 | HR 94 | Resp 16 | Ht 71.0 in | Wt 156.0 lb

## 2012-12-03 DIAGNOSIS — F1020 Alcohol dependence, uncomplicated: Secondary | ICD-10-CM

## 2012-12-03 DIAGNOSIS — I10 Essential (primary) hypertension: Secondary | ICD-10-CM

## 2012-12-03 DIAGNOSIS — R05 Cough: Secondary | ICD-10-CM | POA: Insufficient documentation

## 2012-12-03 DIAGNOSIS — R059 Cough, unspecified: Secondary | ICD-10-CM | POA: Insufficient documentation

## 2012-12-03 DIAGNOSIS — F17200 Nicotine dependence, unspecified, uncomplicated: Secondary | ICD-10-CM | POA: Diagnosis not present

## 2012-12-03 DIAGNOSIS — E785 Hyperlipidemia, unspecified: Secondary | ICD-10-CM

## 2012-12-03 NOTE — Progress Notes (Signed)
  Subjective:    Patient ID: Wesley Brennan, male    DOB: 1960-12-10, 52 y.o.   MRN: [id number]  HPI The PT is here for follow up and re-evaluation of chronic medical conditions, medication management and review of any available recent lab and radiology data.  Preventive health is updated, specifically  Cancer screening and Immunization.   C/o chronic cough, continues to smoke      Review of Systems See HPI Denies recent fever or chills. Denies sinus pressure, nasal congestion, ear pain or sore throat. Denies chest congestion,.c/o chronic cough and shortness of breath with activity, ongoing nicotine use Denies chest pains, palpitations and leg swelling Denies abdominal pain, nausea, vomiting,diarrhea or constipation.   Denies dysuria, frequency, hesitancy or incontinence. Chronic joint pain Denies headaches, seizures, numbness, or tingling. Denies depression, anxiety or insomnia. Denies skin break down or rash.        Objective:   Physical Exam Patient alert and oriented and in no cardiopulmonary distress.  HEENT: No facial asymmetry, EOMI, no sinus tenderness,  oropharynx pink and moist.  Neck supple no adenopathy.  Chest: decreased air entry, few crackles or wheezes  CVS: S1, S2 no murmurs, no S3.  ABD: Soft non tender. Bowel sounds normal.  Ext: No edema  MS: Adequate ROM spine, shoulders, hips and knees.  Skin: Intact, no ulcerations or rash noted.  Psych: Good eye contact, normal affect. Memory impaired  not anxious or depressed appearing.  CNS: CN 2-12 intact, power, tone and sensation normal throughout.        Assessment & Plan:

## 2012-12-03 NOTE — Patient Instructions (Addendum)
F/u in 5 month    AST and ALT today, if normal then you will need cholesterol medicine  Reduce beer to no more than 24 ounces every day, plan to quit after 6 weeks, liver is damaged  Need to plan to stop smoking, set a quit date, there are classes at the health dept which i encourage you top attend, we will give you information  CXR today

## 2012-12-06 DIAGNOSIS — R69 Illness, unspecified: Secondary | ICD-10-CM | POA: Diagnosis not present

## 2012-12-06 MED ORDER — PRAVASTATIN SODIUM 40 MG PO TABS: 40.0000 mg | ORAL_TABLET | Freq: Every evening | ORAL | Status: DC

## 2012-12-15 NOTE — Assessment & Plan Note (Signed)
Chronic cough with nicotine use CXR today

## 2012-12-15 NOTE — Assessment & Plan Note (Signed)
Controlled, no change in medication  

## 2012-12-15 NOTE — Assessment & Plan Note (Signed)
Hyperlipidemia:Low fat diet discussed and encouraged.  Pt to start lipid lowering medication

## 2012-12-15 NOTE — Assessment & Plan Note (Signed)
Unchanged. Patient counseled for approximately 5 minutes regarding the health risks of ongoing nicotine use, specifically all types of cancer, heart disease, stroke and respiratory failure. The options available for help with cessation ,the behavioral changes to assist the process, and the option to either gradully reduce usage  Or abruptly stop.is also discussed. Pt is also encouraged to set specific goals in number of cigarettes used daily, as well as to set a quit date.  

## 2012-12-15 NOTE — Assessment & Plan Note (Signed)
Still drinking , but reports he is doing less, advised to reduce alcohol intake

## 2012-12-17 ENCOUNTER — Other Ambulatory Visit: Payer: Self-pay | Admitting: Family Medicine

## 2012-12-17 ENCOUNTER — Encounter (HOSPITAL_COMMUNITY): Payer: Self-pay

## 2012-12-17 ENCOUNTER — Emergency Department (HOSPITAL_COMMUNITY)
Admission: EM | Admit: 2012-12-17 | Discharge: 2012-12-17 | Disposition: A | Discharge status: Home / Self Care | Payer: Medicare Other | Attending: Emergency Medicine | Admitting: Emergency Medicine

## 2012-12-17 ENCOUNTER — Emergency Department (HOSPITAL_COMMUNITY): Payer: Medicare Other

## 2012-12-17 DIAGNOSIS — E7800 Pure hypercholesterolemia, unspecified: Secondary | ICD-10-CM | POA: Insufficient documentation

## 2012-12-17 DIAGNOSIS — S8990XA Unspecified injury of unspecified lower leg, initial encounter: Secondary | ICD-10-CM | POA: Insufficient documentation

## 2012-12-17 DIAGNOSIS — M25461 Effusion, right knee: Secondary | ICD-10-CM

## 2012-12-17 DIAGNOSIS — M25469 Effusion, unspecified knee: Secondary | ICD-10-CM | POA: Diagnosis not present

## 2012-12-17 DIAGNOSIS — S99929A Unspecified injury of unspecified foot, initial encounter: Secondary | ICD-10-CM | POA: Insufficient documentation

## 2012-12-17 DIAGNOSIS — F17200 Nicotine dependence, unspecified, uncomplicated: Secondary | ICD-10-CM | POA: Insufficient documentation

## 2012-12-17 DIAGNOSIS — F1010 Alcohol abuse, uncomplicated: Secondary | ICD-10-CM | POA: Insufficient documentation

## 2012-12-17 DIAGNOSIS — Y9389 Activity, other specified: Secondary | ICD-10-CM | POA: Insufficient documentation

## 2012-12-17 DIAGNOSIS — M25569 Pain in unspecified knee: Secondary | ICD-10-CM | POA: Diagnosis not present

## 2012-12-17 DIAGNOSIS — R296 Repeated falls: Secondary | ICD-10-CM | POA: Insufficient documentation

## 2012-12-17 DIAGNOSIS — Y9289 Other specified places as the place of occurrence of the external cause: Secondary | ICD-10-CM | POA: Insufficient documentation

## 2012-12-17 MED ORDER — IBUPROFEN 800 MG PO TABS
800.0000 mg | ORAL_TABLET | Freq: Once | ORAL | Status: AC
  Administered 2012-12-17: 800 mg via ORAL
  Filled 2012-12-17: qty 1

## 2012-12-17 MED ORDER — IBUPROFEN 600 MG PO TABS: 600.0000 mg | ORAL_TABLET | Freq: Three times a day (TID) | ORAL | Status: DC | PRN

## 2012-12-17 NOTE — ED Provider Notes (Signed)
Medical screening examination/treatment/procedure(s) were performed by non-physician practitioner and as supervising physician I was immediately available for consultation/collaboration.   Carleene Cooper III, MD 12/17/12 [phone]

## 2012-12-17 NOTE — ED Notes (Signed)
Pt reports was intoxicated a couple of days ago and fell.  C/O pain in R knee.  Swelling noted.
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
## 2012-12-23 ENCOUNTER — Telehealth: Payer: Self-pay | Admitting: Orthopedic Surgery

## 2012-12-23 NOTE — Telephone Encounter (Signed)
Noted  

## 2012-12-23 NOTE — Telephone Encounter (Signed)
I called Wesley Brennan to schedule an appointment with Dr. Romeo Apple for knee pain and he said his knee is much better and did not need to schedule this appointment.

## 2012-12-23 NOTE — Telephone Encounter (Signed)
noted 

## 2013-01-05 ENCOUNTER — Emergency Department (HOSPITAL_COMMUNITY): Payer: Medicare Other

## 2013-01-05 ENCOUNTER — Emergency Department (HOSPITAL_COMMUNITY)
Admission: EM | Admit: 2013-01-05 | Discharge: 2013-01-05 | Disposition: A | Discharge status: Home / Self Care | Payer: Medicare Other | Attending: Emergency Medicine | Admitting: Emergency Medicine

## 2013-01-05 ENCOUNTER — Encounter (HOSPITAL_COMMUNITY): Payer: Self-pay | Admitting: *Deleted

## 2013-01-05 DIAGNOSIS — Z79899 Other long term (current) drug therapy: Secondary | ICD-10-CM | POA: Insufficient documentation

## 2013-01-05 DIAGNOSIS — M79671 Pain in right foot: Secondary | ICD-10-CM

## 2013-01-05 DIAGNOSIS — M7730 Calcaneal spur, unspecified foot: Secondary | ICD-10-CM | POA: Diagnosis not present

## 2013-01-05 DIAGNOSIS — F17200 Nicotine dependence, unspecified, uncomplicated: Secondary | ICD-10-CM | POA: Insufficient documentation

## 2013-01-05 DIAGNOSIS — F1010 Alcohol abuse, uncomplicated: Secondary | ICD-10-CM | POA: Insufficient documentation

## 2013-01-05 DIAGNOSIS — M79609 Pain in unspecified limb: Secondary | ICD-10-CM | POA: Insufficient documentation

## 2013-01-05 DIAGNOSIS — E7800 Pure hypercholesterolemia, unspecified: Secondary | ICD-10-CM | POA: Insufficient documentation

## 2013-01-05 LAB — CBC WITH DIFFERENTIAL/PLATELET
Basophils Absolute: 0 10*3/uL (ref 0.0–0.1)
Eosinophils Absolute: 0.4 10*3/uL (ref 0.0–0.7)
Eosinophils Relative: 6 % — ABNORMAL HIGH (ref 0–5)
Lymphocytes Relative: 29 % (ref 12–46)
MCV: 98.7 fL (ref 78.0–100.0)
Platelets: 181 10*3/uL (ref 150–400)
RDW: 12.3 % (ref 11.5–15.5)
WBC: 6.8 10*3/uL (ref 4.0–10.5)

## 2013-01-05 LAB — COMPREHENSIVE METABOLIC PANEL
ALT: 19 U/L (ref 0–53)
AST: 37 U/L (ref 0–37)
Albumin: 3.8 g/dL (ref 3.5–5.2)
Calcium: 9.5 mg/dL (ref 8.4–10.5)
Sodium: 142 mEq/L (ref 135–145)
Total Protein: 8 g/dL (ref 6.0–8.3)

## 2013-01-05 MED ORDER — SULFAMETHOXAZOLE-TRIMETHOPRIM 800-160 MG PO TABS: ORAL_TABLET | ORAL | Status: DC

## 2013-01-05 MED ORDER — VANCOMYCIN HCL IN DEXTROSE 1-5 GM/200ML-% IV SOLN
1000.0000 mg | Freq: Once | INTRAVENOUS | Status: AC
  Administered 2013-01-05: 1000 mg via INTRAVENOUS
  Filled 2013-01-05: qty 200

## 2013-01-05 MED ORDER — ONDANSETRON HCL 4 MG/2ML IJ SOLN
4.0000 mg | Freq: Once | INTRAMUSCULAR | Status: AC
  Administered 2013-01-05: 4 mg via INTRAVENOUS
  Filled 2013-01-05: qty 2

## 2013-01-05 MED ORDER — HYDROMORPHONE HCL PF 1 MG/ML IJ SOLN
0.5000 mg | Freq: Once | INTRAMUSCULAR | Status: AC
  Administered 2013-01-05: 0.5 mg via INTRAVENOUS
  Filled 2013-01-05: qty 1

## 2013-01-05 NOTE — ED Notes (Signed)
Pt reporting pain and swelling in left foot starting on Thurs night.  Denies injury, states he occasionally experiences this.

## 2013-01-05 NOTE — ED Provider Notes (Signed)
History     CSN: [id number]  Arrival date & time 01/05/13  0254   First MD Initiated Contact with Patient 01/05/13 0413      Chief Complaint  Patient presents with  . Foot Pain    (Consider location/radiation/quality/duration/timing/severity/associated sxs/prior treatment) Patient is a 52 y.o. male presenting with lower extremity pain. The history is provided by the patient (pt complains of left foot pain).  Foot Pain This is a new problem. The current episode started 2 days ago. The problem occurs constantly. The problem has been gradually worsening. Pertinent negatives include no chest pain, no abdominal pain and no headaches. Nothing aggravates the symptoms. Nothing relieves the symptoms.    Past Medical History  Diagnosis Date  . Hypercholesteremia   . Substance abuse     alcohol, nicotine, h/o coacaine and marijuana    Past Surgical History  Procedure Laterality Date  . Hernia repair  1961 approx    umbilical hernia  . Elbow fusion  1993    left elbow dislocation    Family History  Problem Relation Age of Onset  . Adopted: Yes    History  Substance Use Topics  . Smoking status: Current Every Day Smoker -- 1.00 packs/day    Types: Cigarettes  . Smokeless tobacco: Not on file  . Alcohol Use: 1.8 oz/week    3 Cans of beer per week     Comment: everyday      Review of Systems  Constitutional: Negative for appetite change and fatigue.  HENT: Negative for congestion, sinus pressure and ear discharge.   Eyes: Negative for discharge.  Respiratory: Negative for cough.   Cardiovascular: Negative for chest pain.  Gastrointestinal: Negative for abdominal pain and diarrhea.  Genitourinary: Negative for frequency and hematuria.  Musculoskeletal: Negative for back pain.       Foot pain on left  Skin: Negative for rash.  Neurological: Negative for seizures and headaches.  Psychiatric/Behavioral: Negative for hallucinations.    Allergies  Review of patient's  allergies indicates no known allergies.  Home Medications   Current Outpatient Rx  Name  Route  Sig  Dispense  Refill  . ibuprofen (ADVIL,MOTRIN) 600 MG tablet   Oral   Take 1 tablet (600 mg total) by mouth every 8 (eight) hours as needed for pain.   20 tablet   0   . pravastatin (PRAVACHOL) 40 MG tablet   Oral   Take 1 tablet (40 mg total) by mouth every evening.   30 tablet   11     BP 144/85  Pulse 89  Temp(Src) 98.5 F (36.9 C) (Oral)  Resp 16  Ht 5\' 11"  (1.803 m)  Wt 165 lb (74.844 kg)  BMI 23.02 kg/m2  SpO2 100%  Physical Exam  Constitutional: He is oriented to person, place, and time. He appears well-developed.  HENT:  Head: Normocephalic.  Eyes: Conjunctivae are normal.  Neck: No tracheal deviation present.  Cardiovascular:  No murmur heard. Musculoskeletal: Normal range of motion.  Distal left foot swolllen and red  Neurological: He is oriented to person, place, and time.  Skin: Skin is warm.  Psychiatric: He has a normal mood and affect.    ED Course  Procedures (including critical care time)  Labs Reviewed  CBC WITH DIFFERENTIAL - Abnormal; Notable for the following:    RBC 3.88 (*)    HCT 38.3 (*)    Eosinophils Relative 6 (*)    All other components within normal limits  COMPREHENSIVE METABOLIC  PANEL - Abnormal; Notable for the following:    Glucose, Bld 110 (*)    GFR calc non Af Amer 80 (*)    All other components within normal limits   Dg Foot Complete Left  01/05/2013   *RADIOLOGY REPORT*  Clinical Data:  Pain and swelling in left foot, no known injury  LEFT FOOT - COMPLETE 3+ VIEW  Comparison: None  Findings: Osseous mineralization normal. Joint spaces preserved. Small plantar and Achilles insertion calcaneal spurs. No acute fracture, dislocation or bone destruction.  IMPRESSION: Calcaneal spurring. No acute bony abnormalities.   Original Report Authenticated By: Ulyses Southward, M.D.     1. Foot pain, right       MDM  Cellulitis left  foot        Benny Lennert, MD 01/05/13 [phone]

## 2013-01-14 ENCOUNTER — Ambulatory Visit: Payer: Medicare Other | Admitting: Family Medicine

## 2013-01-24 ENCOUNTER — Ambulatory Visit: Payer: Medicare Other | Admitting: Family Medicine

## 2013-05-02 ENCOUNTER — Encounter (INDEPENDENT_AMBULATORY_CARE_PROVIDER_SITE_OTHER): Payer: Self-pay

## 2013-05-02 ENCOUNTER — Encounter: Payer: Self-pay | Admitting: Family Medicine

## 2013-05-02 ENCOUNTER — Ambulatory Visit (INDEPENDENT_AMBULATORY_CARE_PROVIDER_SITE_OTHER): Payer: Medicare Other | Admitting: Family Medicine

## 2013-05-02 VITALS — BP 134/84 | HR 90 | Resp 16 | Ht 71.0 in | Wt 154.4 lb

## 2013-05-02 DIAGNOSIS — E785 Hyperlipidemia, unspecified: Secondary | ICD-10-CM

## 2013-05-02 DIAGNOSIS — F17200 Nicotine dependence, unspecified, uncomplicated: Secondary | ICD-10-CM

## 2013-05-02 DIAGNOSIS — I10 Essential (primary) hypertension: Secondary | ICD-10-CM

## 2013-05-02 DIAGNOSIS — Z23 Encounter for immunization: Secondary | ICD-10-CM | POA: Diagnosis not present

## 2013-05-02 DIAGNOSIS — Z125 Encounter for screening for malignant neoplasm of prostate: Secondary | ICD-10-CM | POA: Diagnosis not present

## 2013-05-02 DIAGNOSIS — R5381 Other malaise: Secondary | ICD-10-CM | POA: Diagnosis not present

## 2013-05-02 DIAGNOSIS — F1020 Alcohol dependence, uncomplicated: Secondary | ICD-10-CM

## 2013-05-02 DIAGNOSIS — R7301 Impaired fasting glucose: Secondary | ICD-10-CM

## 2013-05-02 DIAGNOSIS — R5383 Other fatigue: Secondary | ICD-10-CM

## 2013-05-02 NOTE — Progress Notes (Signed)
  Subjective:    Patient ID: Wesley Brennan, male    DOB: Jul 11, 1961, 52 y.o.   MRN: [id number]  HPI The PT is here for follow up and re-evaluation of chronic medical conditions.He has independently discontinued his lipid lowering med, and his breakfast this morning was a hamburger. Re educated about the need for low fat diet, and labs are to be updated this weekend  Preventive health is updated, specifically  Cancer screening and Immunization.    There are no new concerns. [place] Dec he is drinking less alcohol "aint got no time" also current nicotine use is10 per day, willing to cut down gradually , accepts the fact that cessation is beneficial There are no specific complaints       Review of Systems See HPI Denies recent fever or chills. Denies sinus pressure, nasal congestion, ear pain or sore throat. Denies chest congestion, productive cough or wheezing. Denies chest pains, palpitations and leg swelling Denies abdominal pain, nausea, vomiting,diarrhea or constipation.   Denies dysuria, frequency, hesitancy or incontinence. Denies joint pain, swelling and limitation in mobility. Denies headaches, seizures, numbness, or tingling. Denies depression, anxiety or insomnia. Denies skin break down or rash.        Objective:   Physical Exam  Patient alert and oriented and in no cardiopulmonary distress.  HEENT: No facial asymmetry, EOMI, no sinus tenderness,  oropharynx pink and moist.  Neck supple no adenopathy.  Chest: Clear to auscultation bilaterally.Decreased though adequate air entry , no crackles or wheezes  CVS: S1, S2 no murmurs, no S3.  ABD: Soft non tender. Bowel sounds normal.  Ext: No edema  MS: Adequate ROM spine, shoulders, hips and knees.  Skin: Intact, no ulcerations or rash noted.  Psych: Good eye contact, normal affect. Memory intact not anxious or depressed appearing.  CNS: CN 2-12 intact, power, tone and sensation normal throughout.        Assessment & Plan:

## 2013-05-02 NOTE — Assessment & Plan Note (Signed)
Current is 10 per day, will attempt to gradually discontinue entirely by reducing by 1 cigarette each week Patient counseled for approximately 5 minutes regarding the health risks of ongoing nicotine use, specifically all types of cancer, heart disease, stroke and respiratory failure. The options available for help with cessation ,the behavioral changes to assist the process, and the option to either gradully reduce usage  Or abruptly stop.is also discussed. Pt is also encouraged to set specific goals in number of cigarettes used daily, as well as to set a quit date.

## 2013-05-02 NOTE — Patient Instructions (Signed)
F/u in 5 month, call if you need me before  Flu vaccine today   Please cut back cigarette smoking by one cigarette each week, so next week start at 9 per day  Fasting lipid, chem 7, HBA1C, TSH and PSA tomorrow/asap  Return to discuss lab report with nurse as arranged since you have no phone contact, you likely need to resume medication for cholesterol

## 2013-05-02 NOTE — Assessment & Plan Note (Signed)
Normotensive off of medication.no need to resume medication at this time DASH diet and commitment to daily physical activity for a minimum of 30 minutes discussed and encouraged, as a part of hypertension management. The importance of attaining a healthy weight is also discussed.

## 2013-05-02 NOTE — Assessment & Plan Note (Signed)
Importance of cessation of alcohol discussed and encouraged

## 2013-05-02 NOTE — Assessment & Plan Note (Signed)
Hyperlipidemia:Low fat diet discussed and encouraged.  Update lab asap

## 2013-05-05 ENCOUNTER — Other Ambulatory Visit: Payer: Self-pay | Admitting: Family Medicine

## 2013-05-05 DIAGNOSIS — Z125 Encounter for screening for malignant neoplasm of prostate: Secondary | ICD-10-CM | POA: Diagnosis not present

## 2013-05-05 DIAGNOSIS — I10 Essential (primary) hypertension: Secondary | ICD-10-CM | POA: Diagnosis not present

## 2013-05-05 DIAGNOSIS — R7301 Impaired fasting glucose: Secondary | ICD-10-CM | POA: Diagnosis not present

## 2013-05-05 DIAGNOSIS — E785 Hyperlipidemia, unspecified: Secondary | ICD-10-CM | POA: Diagnosis not present

## 2013-05-05 DIAGNOSIS — R5381 Other malaise: Secondary | ICD-10-CM | POA: Diagnosis not present

## 2013-05-05 LAB — BASIC METABOLIC PANEL
Calcium: 9.8 mg/dL (ref 8.4–10.5)
Sodium: 137 mEq/L (ref 135–145)

## 2013-05-05 LAB — TSH: TSH: 1.754 u[IU]/mL (ref 0.350–4.500)

## 2013-05-05 LAB — HEMOGLOBIN A1C: Hgb A1c MFr Bld: 5 % (ref <5.7)

## 2013-05-05 LAB — LIPID PANEL
HDL: 102 mg/dL (ref >39)
LDL Cholesterol: 121 mg/dL — ABNORMAL HIGH (ref 0–99)
Total CHOL/HDL Ratio: 2.5 Ratio

## 2013-05-05 LAB — PSA, MEDICARE: PSA: 2.56 ng/mL (ref <=4.00)

## 2013-05-06 LAB — HEPATIC FUNCTION PANEL
ALT: 26 U/L (ref 0–53)
Alkaline Phosphatase: 76 U/L (ref 39–117)
Bilirubin, Direct: 0.2 mg/dL (ref 0.0–0.3)
Indirect Bilirubin: 0.8 mg/dL (ref 0.0–0.9)
Total Protein: 7.9 g/dL (ref 6.0–8.3)

## 2013-05-07 ENCOUNTER — Other Ambulatory Visit: Payer: Self-pay

## 2013-05-07 DIAGNOSIS — E785 Hyperlipidemia, unspecified: Secondary | ICD-10-CM

## 2013-05-07 LAB — HEPATITIS PANEL, ACUTE: Hep A IgM: NONREACTIVE

## 2013-05-07 MED ORDER — PRAVASTATIN SODIUM 40 MG PO TABS: 40.0000 mg | ORAL_TABLET | Freq: Every evening | ORAL | Status: DC

## 2013-06-18 ENCOUNTER — Encounter (HOSPITAL_COMMUNITY): Payer: Self-pay | Admitting: Emergency Medicine

## 2013-06-18 ENCOUNTER — Emergency Department (HOSPITAL_COMMUNITY)
Admission: EM | Admit: 2013-06-18 | Discharge: 2013-06-18 | Disposition: A | Discharge status: Home / Self Care | Payer: Medicare Other | Attending: Emergency Medicine | Admitting: Emergency Medicine

## 2013-06-18 ENCOUNTER — Emergency Department (HOSPITAL_COMMUNITY): Payer: Medicare Other

## 2013-06-18 DIAGNOSIS — M25579 Pain in unspecified ankle and joints of unspecified foot: Secondary | ICD-10-CM | POA: Diagnosis not present

## 2013-06-18 DIAGNOSIS — Z79899 Other long term (current) drug therapy: Secondary | ICD-10-CM | POA: Diagnosis not present

## 2013-06-18 DIAGNOSIS — R609 Edema, unspecified: Secondary | ICD-10-CM | POA: Diagnosis not present

## 2013-06-18 DIAGNOSIS — F17200 Nicotine dependence, unspecified, uncomplicated: Secondary | ICD-10-CM | POA: Insufficient documentation

## 2013-06-18 DIAGNOSIS — M7989 Other specified soft tissue disorders: Secondary | ICD-10-CM | POA: Insufficient documentation

## 2013-06-18 DIAGNOSIS — E7800 Pure hypercholesterolemia, unspecified: Secondary | ICD-10-CM | POA: Diagnosis not present

## 2013-06-18 DIAGNOSIS — M79671 Pain in right foot: Secondary | ICD-10-CM

## 2013-06-18 LAB — COMPREHENSIVE METABOLIC PANEL
ALT: 20 U/L (ref 0–53)
AST: 40 U/L — ABNORMAL HIGH (ref 0–37)
Albumin: 3.4 g/dL — ABNORMAL LOW (ref 3.5–5.2)
Alkaline Phosphatase: 117 U/L (ref 39–117)
BUN: 17 mg/dL (ref 6–23)
CO2: 28 mEq/L (ref 19–32)
Calcium: 9.7 mg/dL (ref 8.4–10.5)
Creatinine, Ser: 1.06 mg/dL (ref 0.50–1.35)
GFR calc Af Amer: >90 mL/min (ref >90)
GFR calc non Af Amer: 78 mL/min — ABNORMAL LOW (ref >90)
Glucose, Bld: 106 mg/dL — ABNORMAL HIGH (ref 70–99)
Sodium: 142 mEq/L (ref 135–145)
Total Bilirubin: 0.5 mg/dL (ref 0.3–1.2)
Total Protein: 8.4 g/dL — ABNORMAL HIGH (ref 6.0–8.3)

## 2013-06-18 LAB — CBC WITH DIFFERENTIAL/PLATELET
Basophils Absolute: 0 10*3/uL (ref 0.0–0.1)
Basophils Relative: 0 % (ref 0–1)
Eosinophils Absolute: 0.4 10*3/uL (ref 0.0–0.7)
Eosinophils Relative: 5 % (ref 0–5)
HCT: 39.3 % (ref 39.0–52.0)
Lymphocytes Relative: 32 % (ref 12–46)
MCHC: 32.6 g/dL (ref 30.0–36.0)
MCV: 100.8 fL — ABNORMAL HIGH (ref 78.0–100.0)
Monocytes Absolute: 0.7 10*3/uL (ref 0.1–1.0)
Monocytes Relative: 10 % (ref 3–12)
Neutro Abs: 3.9 10*3/uL (ref 1.7–7.7)
Platelets: 293 10*3/uL (ref 150–400)
RDW: 12.5 % (ref 11.5–15.5)
WBC: 7.3 10*3/uL (ref 4.0–10.5)

## 2013-06-18 MED ORDER — DOXYCYCLINE HYCLATE 100 MG PO CAPS: 100.0000 mg | ORAL_CAPSULE | Freq: Two times a day (BID) | ORAL | Status: DC

## 2013-06-18 MED ORDER — NAPROXEN 500 MG PO TABS: 500.0000 mg | ORAL_TABLET | Freq: Two times a day (BID) | ORAL | Status: DC

## 2013-06-18 MED ORDER — VANCOMYCIN HCL IN DEXTROSE 1-5 GM/200ML-% IV SOLN
1000.0000 mg | Freq: Once | INTRAVENOUS | Status: AC
  Administered 2013-06-18: 1000 mg via INTRAVENOUS
  Filled 2013-06-18: qty 200

## 2013-06-18 NOTE — ED Notes (Signed)
Pt alert & oriented x4, stable gait. Patient given discharge instructions, paperwork & prescription(s). Patient  instructed to stop at the registration desk to finish any additional paperwork. Patient verbalized understanding. Pt left department w/ no further questions. 

## 2013-06-18 NOTE — ED Notes (Signed)
Pt transferred to room 15. Report given to G. Carole Binning, Charity fundraiser

## 2013-06-18 NOTE — ED Provider Notes (Signed)
CSN: [id number]     Arrival date & time 06/18/13  1518 History  This chart was scribed for Wesley Lennert, MD by Leone Payor, ED Scribe. This patient was seen in room [facility] and the patient's care was started 3:49 PM.    Chief Complaint  Patient presents with  . Foot Injury    Patient is a 52 y.o. male presenting with lower extremity pain. The history is provided by the patient. No language interpreter was used.  Foot Pain This is a recurrent problem. The current episode started more than 2 days ago. The problem occurs constantly. The problem has been gradually worsening. Pertinent negatives include no chest pain, no abdominal pain and no headaches. The symptoms are aggravated by walking. Nothing relieves the symptoms. He has tried nothing for the symptoms. The treatment provided no relief.    HPI Comments: TREVAR BOEHRINGER is a 52 y.o. male who presents to the Emergency Department complaining of a new episode of right foot pain that began 5 days ago. Pt states he has had similar symptoms in the past. He states he was seen here before for the same symptoms and was prescribed antibiotics. He denies fever or chills.   Past Medical History  Diagnosis Date  . Hypercholesteremia   . Substance abuse     alcohol, nicotine, h/o coacaine and marijuana   Past Surgical History  Procedure Laterality Date  . Hernia repair  1961 approx    umbilical hernia  . Elbow fusion  1993    left elbow dislocation   Family History  Problem Relation Age of Onset  . Adopted: Yes  . Cancer Mother     Bone   . Hypertension Sister    History  Substance Use Topics  . Smoking status: Current Every Day Smoker -- 0.50 packs/day    Types: Cigarettes  . Smokeless tobacco: Not on file     Comment: reduce by 1 cigarette each week  . Alcohol Use: 1.8 oz/week    3 Cans of beer per week     Comment: everyday    Review of Systems  Constitutional: Negative for chills and appetite change.  HENT: Negative for  congestion, ear discharge and sinus pressure.   Eyes: Negative for discharge.  Respiratory: Negative for cough.   Cardiovascular: Negative for chest pain.  Gastrointestinal: Negative for abdominal pain and diarrhea.  Genitourinary: Negative for frequency and hematuria.  Musculoskeletal: Positive for arthralgias (right foot pain).  Skin: Negative for rash.  Neurological: Negative for seizures and headaches.  Psychiatric/Behavioral: Negative for hallucinations.    Allergies  Review of patient's allergies indicates no known allergies.  Home Medications   Current Outpatient Rx  Name  Route  Sig  Dispense  Refill  . ibuprofen (ADVIL,MOTRIN) 600 MG tablet   Oral   Take 1 tablet (600 mg total) by mouth every 8 (eight) hours as needed for pain.   20 tablet   0   . pravastatin (PRAVACHOL) 40 MG tablet   Oral   Take 1 tablet (40 mg total) by mouth every evening.   30 tablet   11   . sulfamethoxazole-trimethoprim (BACTRIM DS,SEPTRA DS) 800-160 MG per tablet      One po bid   20 tablet   0    BP 134/85  Pulse 91  Temp(Src) 98.6 F (37 C) (Oral)  Resp 18  Ht 5\' 11"  (1.803 m)  Wt 154 lb (69.854 kg)  BMI 21.49 kg/m2  SpO2 100% Physical  Exam  Nursing note and vitals reviewed. Constitutional: He is oriented to person, place, and time. He appears well-developed.  HENT:  Head: Normocephalic.  Eyes: Conjunctivae are normal.  Neck: No tracheal deviation present.  Cardiovascular:  No murmur heard. Musculoskeletal: Normal range of motion. He exhibits edema and tenderness.  Tenderness and swelling to the lateral mid foot.   Neurological: He is oriented to person, place, and time.  Skin: Skin is warm.  Psychiatric: He has a normal mood and affect.    ED Course  Procedures   DIAGNOSTIC STUDIES: Oxygen Saturation is 100% on RA, normal by my interpretation.    COORDINATION OF CARE: 3:48 PM Will order XRAY of right foot along with CBC, CMP, and uric acid. Discussed treatment  plan with pt at bedside and pt agreed to plan.  Medications  vancomycin (VANCOCIN) IVPB 1000 mg/200 mL premix (not administered)    Labs Review Labs Reviewed - No data to display Imaging Review No results found.  EKG Interpretation   None       MDM  No diagnosis found. The chart was scribed for me under my direct supervision.  I personally performed the history, physical, and medical decision making and all procedures in the evaluation of this patient.Wesley Lennert, MD 06/18/13 [phone]

## 2013-06-18 NOTE — ED Notes (Signed)
Pt c/o right foot pain and swelling since last Friday.

## 2013-09-01 IMAGING — CR DG FOOT COMPLETE 3+V*L*
3 series · 3 of 3 positions shown · non-contrast
Comparison: None

CLINICAL DATA: Pain and swelling in left foot, no known injury

LEFT FOOT - COMPLETE 3+ VIEW

[view not recorded (1 of 3)]
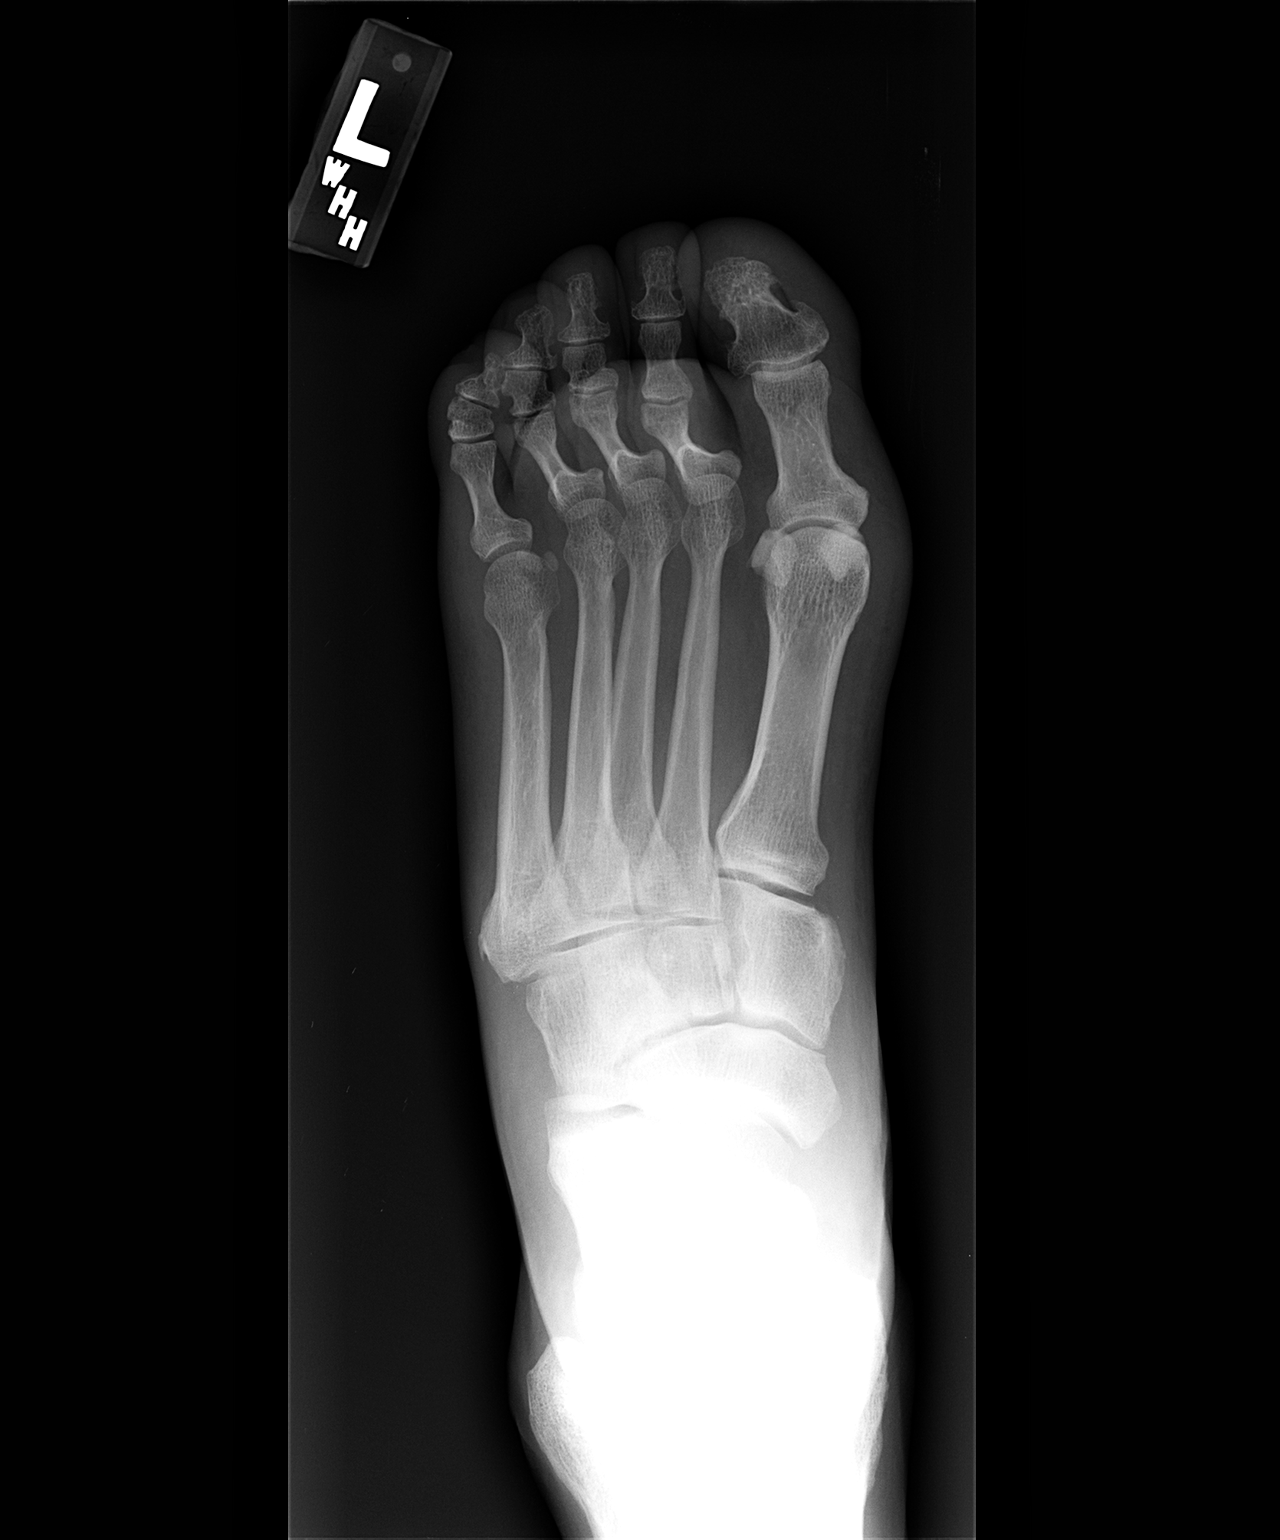

[view not recorded (2 of 3)]
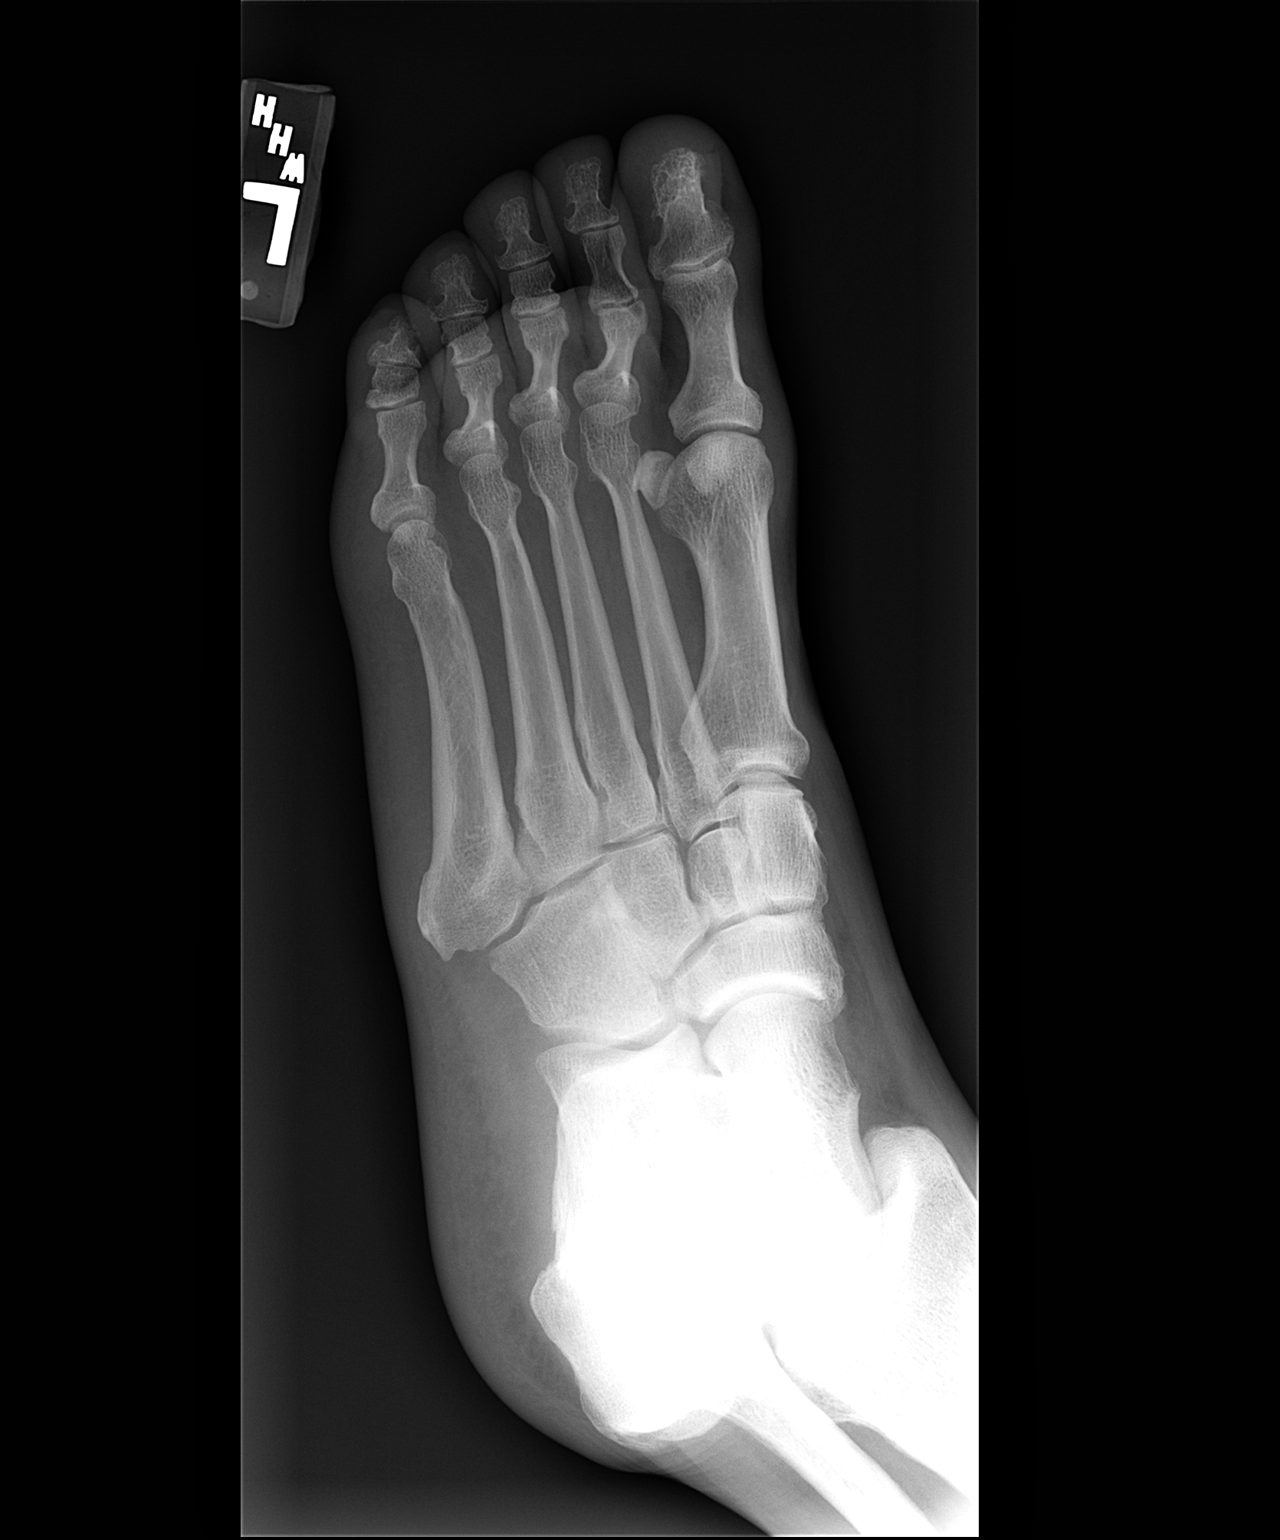

[view not recorded (3 of 3)]
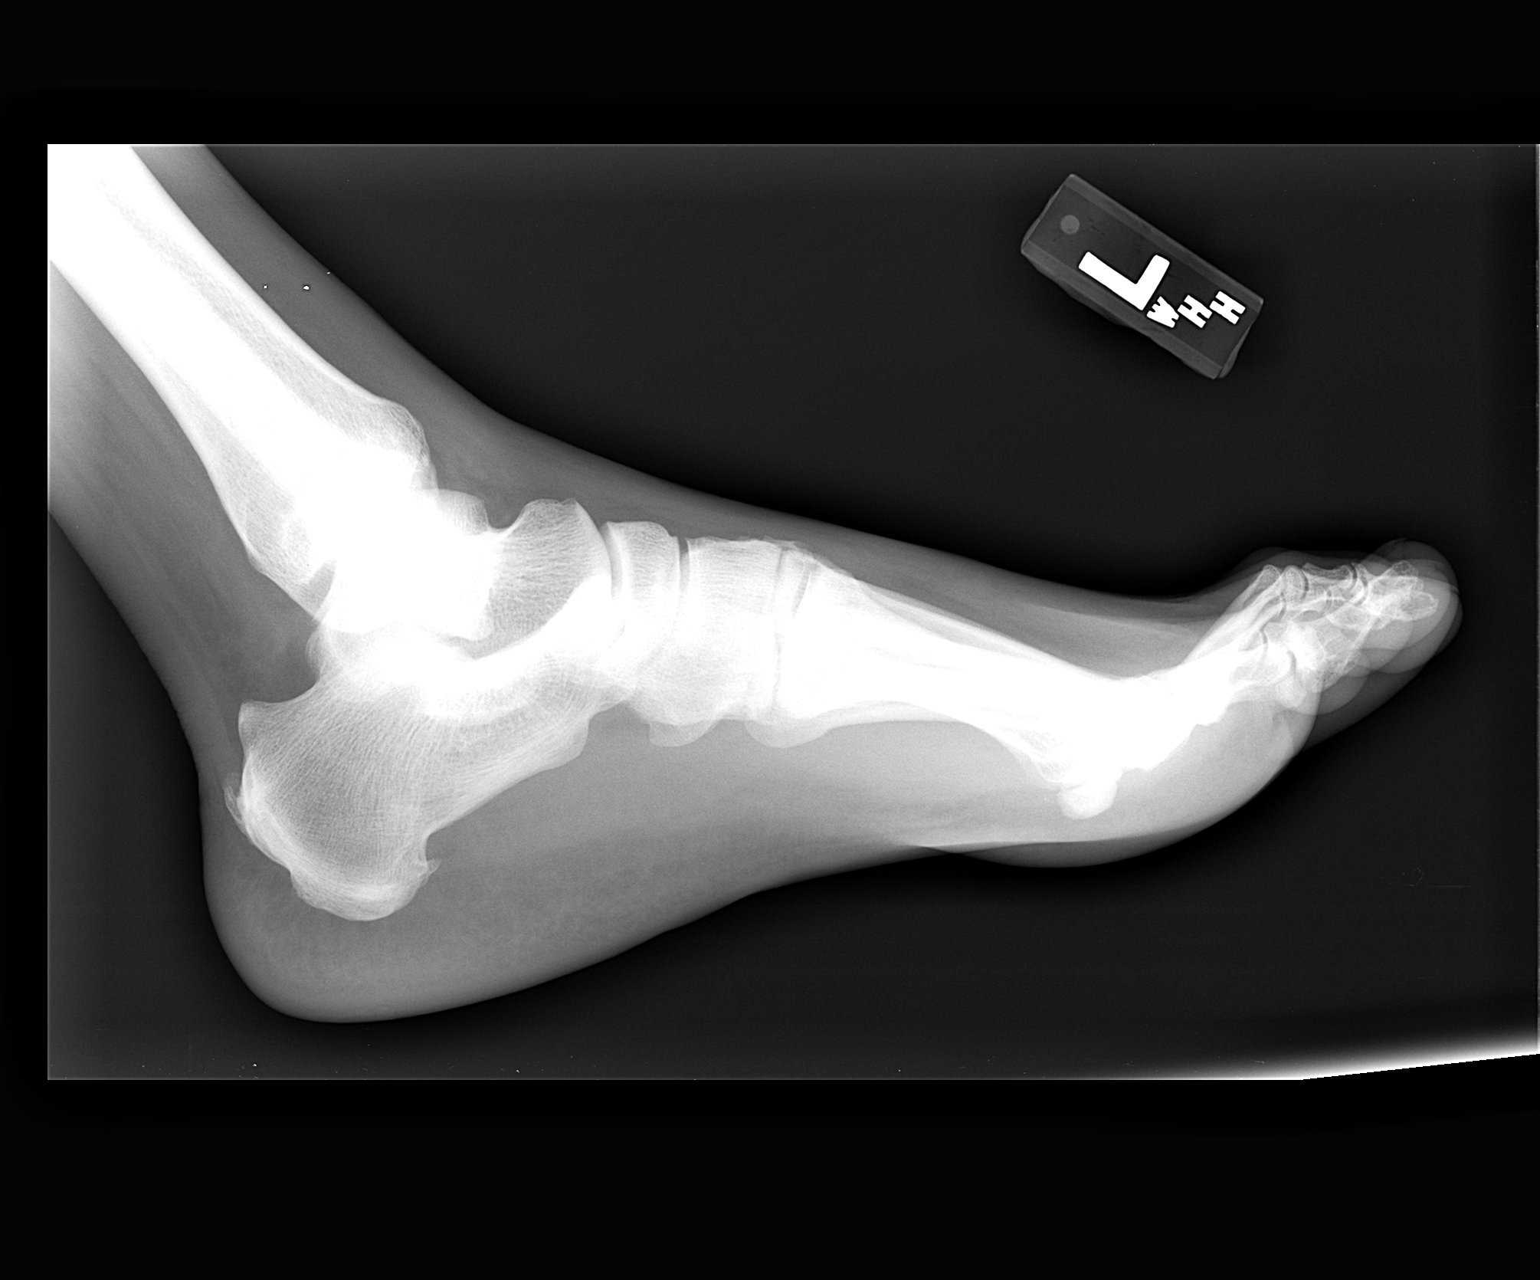

[3 of 3 positions shown; findings below may reference images not displayed]

FINDINGS: Osseous mineralization normal.
Joint spaces preserved.
Small plantar and Achilles insertion calcaneal spurs.
No acute fracture, dislocation or bone destruction.
IMPRESSION: Calcaneal spurring.
No acute bony abnormalities.

## 2013-10-02 ENCOUNTER — Ambulatory Visit: Payer: Medicare Other | Admitting: Family Medicine

## 2013-10-09 ENCOUNTER — Ambulatory Visit (INDEPENDENT_AMBULATORY_CARE_PROVIDER_SITE_OTHER): Payer: Medicare Other | Admitting: Family Medicine

## 2013-10-09 ENCOUNTER — Encounter: Payer: Self-pay | Admitting: Family Medicine

## 2013-10-09 ENCOUNTER — Encounter (INDEPENDENT_AMBULATORY_CARE_PROVIDER_SITE_OTHER): Payer: Self-pay

## 2013-10-09 VITALS — BP 140/90 | HR 94 | Resp 18 | Ht 71.0 in | Wt 154.0 lb

## 2013-10-09 DIAGNOSIS — E785 Hyperlipidemia, unspecified: Secondary | ICD-10-CM

## 2013-10-09 DIAGNOSIS — F17200 Nicotine dependence, unspecified, uncomplicated: Secondary | ICD-10-CM

## 2013-10-09 DIAGNOSIS — F1020 Alcohol dependence, uncomplicated: Secondary | ICD-10-CM

## 2013-10-09 DIAGNOSIS — F79 Unspecified intellectual disabilities: Secondary | ICD-10-CM

## 2013-10-09 DIAGNOSIS — I10 Essential (primary) hypertension: Secondary | ICD-10-CM

## 2013-10-09 MED ORDER — AMLODIPINE BESYLATE 2.5 MG PO TABS: 2.5000 mg | ORAL_TABLET | Freq: Every day | ORAL | Status: DC

## 2013-10-09 MED ORDER — BUPROPION HCL ER (SR) 150 MG PO TB12: 150.0000 mg | ORAL_TABLET | Freq: Two times a day (BID) | ORAL | Status: DC

## 2013-10-09 NOTE — Progress Notes (Signed)
   Subjective:    Patient ID: Wesley Brennan, male    DOB: [birth year], 53 y.o.   MRN: [id number]  HPI The PT is here for follow up and re-evaluation of chronic medical conditions, medication management and review of any available recent lab and radiology data.  Preventive health is updated, specifically  Cancer screening and Immunization.   States today [birth year] his birthday ,he is ready to quit cigarettes as they "are killing me". Unwilling to join AA though alcohol is interfering with his ability to responsibly  live independently     Review of Systems See HPI Denies recent fever or chills. Denies sinus pressure, nasal congestion, ear pain or sore throat. C/o chronic  chest congestion, non  productive cough and increasing shortness of breath. Denies chest pains, palpitations and leg swelling Denies abdominal pain, nausea, vomiting,diarrhea or constipation.   Denies dysuria, frequency, hesitancy or incontinence. Denies joint pain, swelling and limitation in mobility. Denies headaches, seizures, numbness, or tingling. Denies depression, anxiety or insomnia. Denies skin break down or rash.        Objective:   Physical Exam BP 140/90  Pulse 94  Resp 18  Ht 5\' 11"  (1.803 m)  Wt 154 lb (69.854 kg)  BMI 21.49 kg/m2  SpO2 98% Patient alert and oriented and in no cardiopulmonary distress.  HEENT: No facial asymmetry, EOMI, no sinus tenderness,  oropharynx pink and moist.  Neck supple no adenopathy.  Chest: Clear to auscultation bilaterally.Dectreased air entry throughout  CVS: S1, S2 no murmurs, no S3.  ABD: Soft non tender. Bowel sounds normal.  Ext: No edema  MS: Adequate ROM spine, shoulders, hips and knees.  Skin: Intact, no ulcerations or rash noted.  Psych: Good eye contact, normal affect. Memory impaired not anxious or depressed appearing.  CNS: CN 2-12 intact, power, tone and sensation normal throughout.        Assessment & Plan:  HTN  (hypertension) Uncontrolled, start amlodipine DASH diet and commitment to daily physical activity for a minimum of 30 minutes discussed and encouraged, as a part of hypertension management. The importance of attaining a healthy weight is also discussed.   HYPERLIPIDEMIA Updated lab needed, pt off statin due to abn LFT associated with alcohol ingestion Hyperlipidemia:Low fat diet discussed and encouraged.     NICOTINE ADDICTION Cutting back, states he knows cigarettes are killing him and he wants to quit, will reduce by 1 cigaretted per week , now at 10 per day. Patient counseled for approximately 5 minutes regarding the health risks of ongoing nicotine use, specifically all types of cancer, heart disease, stroke and respiratory failure. The options available for help with cessation ,the behavioral changes to assist the process, and the option to either gradully reduce usage  Or abruptly stop.is also discussed. Pt is also encouraged to set specific goals in number of cigarettes used daily, as well as to set a quit date. Will take zyban to help with quitting   ALCOHOLISM Continued excessive use, initially stated he only uses on weekends, 12 beer, however went on to say , that recently sent home from his job due to being intoxicated. Unwilling to go to [place] meeting at this time, though I strongly encouraged this  MENTAL RETARDATION Mild, pt able to function independently, his alcohol addiction interferes with responsible use of his resources

## 2013-10-09 NOTE — Patient Instructions (Addendum)
F/u in3 month, call if you need me before  New for blood pressure is amlodipine 2.5 mg one daily  New to stop smoking is zyban x 2 month  Fasting lipid, cmp in am  No more than SIX beers on the weekend only  CXR  Needed , get tomorrow  please

## 2013-10-11 DIAGNOSIS — F17200 Nicotine dependence, unspecified, uncomplicated: Secondary | ICD-10-CM | POA: Insufficient documentation

## 2013-10-11 DIAGNOSIS — I10 Essential (primary) hypertension: Secondary | ICD-10-CM | POA: Diagnosis not present

## 2013-10-11 NOTE — ED Notes (Signed)
Pt states "I just want to get my blood pressure checked, there's nothing wrong with me". Reports he felt "like it was getting high today. I have a doctor's visit on Monday and I want to make sure it is good".

## 2013-10-11 NOTE — Assessment & Plan Note (Signed)
Updated lab needed, pt off statin due to abn LFT associated with alcohol ingestion Hyperlipidemia:Low fat diet discussed and encouraged.

## 2013-10-11 NOTE — Assessment & Plan Note (Addendum)
Cutting back, states he knows cigarettes are killing him and he wants to quit, will reduce by 1 cigaretted per week , now at 10 per day. Patient counseled for approximately 5 minutes regarding the health risks of ongoing nicotine use, specifically all types of cancer, heart disease, stroke and respiratory failure. The options available for help with cessation ,the behavioral changes to assist the process, and the option to either gradully reduce usage  Or abruptly stop.is also discussed. Pt is also encouraged to set specific goals in number of cigarettes used daily, as well as to set a quit date. Will take zyban to help with quitting

## 2013-10-11 NOTE — Assessment & Plan Note (Signed)
Uncontrolled, start amlodipine DASH diet and commitment to daily physical activity for a minimum of 30 minutes discussed and encouraged, as a part of hypertension management. The importance of attaining a healthy weight is also discussed.

## 2013-10-11 NOTE — Assessment & Plan Note (Signed)
Mild, pt able to function independently, his alcohol addiction interferes with responsible use of his resources

## 2013-10-11 NOTE — Assessment & Plan Note (Signed)
Continued excessive use, initially stated he only uses on weekends, 12 beer, however went on to say , that recently sent home from his job due to being intoxicated. Unwilling to go to [place] meeting at this time, though I strongly encouraged this

## 2013-10-12 ENCOUNTER — Emergency Department (HOSPITAL_COMMUNITY)
Admission: EM | Admit: 2013-10-12 | Discharge: 2013-10-12 | Discharge status: Against Medical Advice | Payer: Medicare Other | Attending: Emergency Medicine | Admitting: Emergency Medicine

## 2013-10-12 ENCOUNTER — Encounter (HOSPITAL_COMMUNITY): Payer: Self-pay | Admitting: Emergency Medicine

## 2013-10-12 NOTE — ED Notes (Signed)
Pt has decided he did not wish to be seen and that he has an appointment with his physician on Monday.  Pt left department prior to being evaluated.

## 2013-12-15 ENCOUNTER — Encounter: Payer: Self-pay | Admitting: Family Medicine

## 2013-12-15 ENCOUNTER — Ambulatory Visit (HOSPITAL_COMMUNITY)
Admission: RE | Admit: 2013-12-15 | Discharge: 2013-12-15 | Disposition: A | Discharge status: Home / Self Care | Payer: Medicare Other | Source: Ambulatory Visit | Attending: Family Medicine | Admitting: Family Medicine

## 2013-12-15 ENCOUNTER — Encounter (INDEPENDENT_AMBULATORY_CARE_PROVIDER_SITE_OTHER): Payer: Self-pay

## 2013-12-15 ENCOUNTER — Ambulatory Visit (INDEPENDENT_AMBULATORY_CARE_PROVIDER_SITE_OTHER): Payer: Medicare Other | Admitting: Family Medicine

## 2013-12-15 VITALS — BP 124/80 | HR 78 | Resp 16 | Wt 148.8 lb

## 2013-12-15 DIAGNOSIS — R918 Other nonspecific abnormal finding of lung field: Secondary | ICD-10-CM | POA: Diagnosis not present

## 2013-12-15 DIAGNOSIS — I10 Essential (primary) hypertension: Secondary | ICD-10-CM

## 2013-12-15 DIAGNOSIS — J441 Chronic obstructive pulmonary disease with (acute) exacerbation: Secondary | ICD-10-CM | POA: Insufficient documentation

## 2013-12-15 DIAGNOSIS — E785 Hyperlipidemia, unspecified: Secondary | ICD-10-CM | POA: Diagnosis not present

## 2013-12-15 DIAGNOSIS — J42 Unspecified chronic bronchitis: Secondary | ICD-10-CM | POA: Insufficient documentation

## 2013-12-15 DIAGNOSIS — J309 Allergic rhinitis, unspecified: Secondary | ICD-10-CM | POA: Diagnosis not present

## 2013-12-15 DIAGNOSIS — F1020 Alcohol dependence, uncomplicated: Secondary | ICD-10-CM

## 2013-12-15 DIAGNOSIS — F17200 Nicotine dependence, unspecified, uncomplicated: Secondary | ICD-10-CM | POA: Diagnosis not present

## 2013-12-15 LAB — COMPREHENSIVE METABOLIC PANEL
ALBUMIN: 4.4 g/dL (ref 3.5–5.2)
ALT: 44 U/L (ref 0–53)
AST: 94 U/L — ABNORMAL HIGH (ref 0–37)
Alkaline Phosphatase: 98 U/L (ref 39–117)
BUN: 21 mg/dL (ref 6–23)
CALCIUM: 9.5 mg/dL (ref 8.4–10.5)
CHLORIDE: 103 meq/L (ref 96–112)
CO2: 15 meq/L — AB (ref 19–32)
Creat: 1 mg/dL (ref 0.50–1.35)
Glucose, Bld: 66 mg/dL — ABNORMAL LOW (ref 70–99)
Potassium: 4.1 mEq/L (ref 3.5–5.3)
SODIUM: 133 meq/L — AB (ref 135–145)
TOTAL PROTEIN: 7.6 g/dL (ref 6.0–8.3)
Total Bilirubin: 1 mg/dL (ref 0.2–1.2)

## 2013-12-15 LAB — LIPID PANEL
Cholesterol: 248 mg/dL — ABNORMAL HIGH (ref 0–200)
HDL: 111 mg/dL (ref >39)
LDL CALC: 95 mg/dL (ref 0–99)
Total CHOL/HDL Ratio: 2.2 Ratio
Triglycerides: 212 mg/dL — ABNORMAL HIGH (ref <150)
VLDL: 42 mg/dL — ABNORMAL HIGH (ref 0–40)

## 2013-12-15 MED ORDER — PENICILLIN V POTASSIUM 500 MG PO TABS: 500.0000 mg | ORAL_TABLET | Freq: Three times a day (TID) | ORAL | Status: DC

## 2013-12-15 MED ORDER — FLUTICASONE PROPIONATE 50 MCG/ACT NA SUSP: 2.0000 | Freq: Every day | NASAL | Status: DC

## 2013-12-15 MED ORDER — PREDNISONE (PAK) 5 MG PO TABS: ORAL_TABLET | ORAL | Status: DC

## 2013-12-15 MED ORDER — METHYLPREDNISOLONE ACETATE 80 MG/ML IJ SUSP
80.0000 mg | Freq: Once | INTRAMUSCULAR | Status: AC
  Administered 2013-12-15: 80 mg via INTRAMUSCULAR

## 2013-12-15 MED ORDER — BENZONATATE 100 MG PO CAPS: 100.0000 mg | ORAL_CAPSULE | Freq: Three times a day (TID) | ORAL | Status: DC | PRN

## 2013-12-15 MED ORDER — PROMETHAZINE-DM 6.25-15 MG/5ML PO SYRP: 5.0000 mL | ORAL_SOLUTION | Freq: Every evening | ORAL | Status: DC | PRN

## 2013-12-15 MED ORDER — MONTELUKAST SODIUM 10 MG PO TABS
10.0000 mg | ORAL_TABLET | Freq: Every day | ORAL | Status: DC
Start: 2013-12-15 — End: 2014-04-27

## 2013-12-15 NOTE — Assessment & Plan Note (Signed)
Controlled, no change in medication  

## 2013-12-15 NOTE — Assessment & Plan Note (Signed)
Uncontrolled and e;levated AST, ongoing alcohol use, will need to stop statin Hyperlipidemia:Low fat diet discussed and encouraged.

## 2013-12-15 NOTE — Progress Notes (Signed)
   Subjective:    Patient ID: Wesley Brennan, male    DOB: 1961-03-18, 53 y.o.   MRN: [id number]  HPI 1 month h/o of non productive cough and chest congestion , no fever or chills occasional  Wheezes C/o excessive mainly clear nasal drainage , and uncontrolled allergy symptoms, no fever or chills Still  Smokes , but "not as much since sick" at l;east 10 per day    Review of Systems See HPI Denies chest pains, palpitations and leg swelling Denies abdominal pain, nausea, vomiting,diarrhea or constipation.   Denies dysuria, frequency, hesitancy or incontinence. Denies joint pain, swelling and limitation in mobility. Denies headaches, seizures, numbness, or tingling. Denies depression, anxiety or insomnia. Denies skin break down or rash.        Objective:   Physical Exam  BP 124/80  Pulse 78  Resp 16  Wt 148 lb 12.8 oz (67.495 kg)  SpO2 96% Patient alert and oriented and in no cardiopulmonary distress.However having coughing spells in office, non productive  HEENT: No facial asymmetry, EOMI, no sinus tenderness,  oropharynx pink and moist.  Neck supple no adenopathy.Nasal  mucosa erythematous and edematous  Chest: adequate but reduced air entry, few crackles , no wheezing  CVS: S1, S2 no murmurs, no S3.  ABD: Soft non tender. Bowel sounds normal.  Ext: No edema  MS: Adequate ROM spine, shoulders, hips and knees.  Skin: Intact, no ulcerations or rash noted.  Psych: Good eye contact, normal affect. Memory intact not anxious or depressed appearing.  CNS: CN 2-12 intact, power, tone and sensation normal throughout.       Assessment & Plan:  Allergic rhinitis Uncontrolled , start daily medication for symptom control, flonase prescribed also singulair and prednisone dose pack  Unspecified chronic bronchitis 3 week h/o cough and conngestion, decongestant , PCN and depomedrol  administered  HTN (hypertension) Controlled, no change in medication   NICOTINE  ADDICTION Unchanged Patient counseled for approximately 5 minutes regarding the health risks of ongoing nicotine use, specifically all types of cancer, heart disease, stroke and respiratory failure. The options available for help with cessation ,the behavioral changes to assist the process, and the option to either gradully reduce usage  Or abruptly stop.is also discussed. Pt is also encouraged to set specific goals in number of cigarettes used daily, as well as to set a quit date.   HYPERLIPIDEMIA Uncontrolled and e;levated AST, ongoing alcohol use, will need to stop statin Hyperlipidemia:Low fat diet discussed and encouraged.    ALCOHOLISM transiminitis , most likely related to ETOH, needs o quit , statin to be put on hold

## 2013-12-15 NOTE — Assessment & Plan Note (Signed)
transiminitis , most likely related to ETOH, needs o quit , statin to be put on hold

## 2013-12-15 NOTE — Patient Instructions (Signed)
Annual; wellness in 3.5 month, call if youi  Need me before  You are treated today for uncontrolled allergies and chronic bronchitis.  Injection in office , CXR today and medications sent in pls take as directed   You need to stop smoking to improve your health, heSmoking Cessation, Tips for Success If you are ready to quit smoking, congratulations! You have chosen to help yourself be healthier. Cigarettes bring nicotine, tar, carbon monoxide, and other irritants into your body. Your lungs, heart, and blood vessels will be able to work better without these poisons. There are many different ways to quit smoking. Nicotine gum, nicotine patches, a nicotine inhaler, or nicotine nasal spray can help with physical craving. Hypnosis, support groups, and medicines help break the habit of smoking. WHAT THINGS CAN I DO TO MAKE QUITTING EASIER?  Here are some tips to help you quit for good:  Pick a date when you will quit smoking completely. Tell all of your friends and family about your plan to quit on that date.  Do not try to slowly cut down on the number of cigarettes you are smoking. Pick a quit date and quit smoking completely starting on that day.  Throw away all cigarettes.   Clean and remove all ashtrays from your home, work, and car.   On a card, write down your reasons for quitting. Carry the card with you and read it when you get the urge to smoke.   Cleanse your body of nicotine. Drink enough water and fluids to keep your urine clear or pale yellow. Do this after quitting to flush the nicotine from your body.   Learn to predict your moods. Do not let a bad situation be your excuse to have a cigarette. Some situations in your life might tempt you into wanting a cigarette.   Never have "just one" cigarette. It leads to wanting another and another. Remind yourself of your decision to quit.   Change habits associated with smoking. If you smoked while driving or when feeling stressed,  try other activities to replace smoking. Stand up when drinking your coffee. Brush your teeth after eating. Sit in a different chair when you read the paper. Avoid alcohol while trying to quit, and try to drink fewer caffeinated beverages. Alcohol and caffeine may urge you to smoke.   Avoid foods and drinks that can trigger a desire to smoke, such as sugary or spicy foods and alcohol.   Ask people who smoke not to smoke around you.   Have something planned to do right after eating or having a cup of coffee. For example, plan to take a walk or exercise.   Try a relaxation exercise to calm you down and decrease your stress. Remember, you may be tense and nervous for the first 2 weeks after you quit, but this will pass.   Find new activities to keep your hands busy. Play with a pen, coin, or rubber band. Doodle or draw things on paper.   Brush your teeth right after eating. This will help cut down on the craving for the taste of tobacco after meals. You can also try mouthwash.   Use oral substitutes in place of cigarettes. Try using lemon drops, carrots, cinnamon sticks, or chewing gum. Keep them handy so they are available when you have the urge to smoke.   When you have the urge to smoke, try deep breathing.   Designate your home as a nonsmoking area.   If you are a  heavy smoker, ask your health care provider about a prescription for nicotine chewing gum. It can ease your withdrawal from nicotine.   Reward yourself. Set aside the cigarette money you save and buy yourself something nice.   Look for support from others. Join a support group or smoking cessation program. Ask someone at home or at work to help you with your plan to quit smoking.   Always ask yourself, "Do I need this cigarette or is this just a reflex?" Tell yourself, "Today, I choose not to smoke," or "I do not want to smoke." You are reminding yourself of your decision to quit.  Do not replace cigarette smoking  with electronic cigarettes (commonly called e-cigarettes). The safety of e-cigarettes is unknown, and some may contain harmful chemicals.  If you relapse, do not give up! Plan ahead and think about what you will do the next time you get the urge to smoke.  HOW WILL I FEEL WHEN I QUIT SMOKING? You may have symptoms of withdrawal because your body is used to nicotine (the addictive substance in cigarettes). You may crave cigarettes, be irritable, feel very hungry, cough often, get headaches, or have difficulty concentrating. The withdrawal symptoms are only temporary. They are strongest when you first quit but will go away within 10 14 days. When withdrawal symptoms occur, stay in control. Think about your reasons for quitting. Remind yourself that these are signs that your body is healing and getting used to being without cigarettes. Remember that withdrawal symptoms are easier to treat than the major diseases that smoking can cause.  Even after the withdrawal is over, expect periodic urges to smoke. However, these cravings are generally short lived and will go away whether you smoke or not. Do not smoke!  WHAT RESOURCES ARE AVAILABLE TO HELP ME QUIT SMOKING? Your health care provider can direct you to community resources or hospitals for support, which may include:  Group support.  Education.  Hypnosis.  Therapy. Document Released: 03/31/2004 Document Revised: 04/23/2013 Document Reviewed: 12/19/2012 [facility] Patient Information 2014 [place], [place]. re are some tips

## 2013-12-15 NOTE — Assessment & Plan Note (Signed)
Unchanged. Patient counseled for approximately 5 minutes regarding the health risks of ongoing nicotine use, specifically all types of cancer, heart disease, stroke and respiratory failure. The options available for help with cessation ,the behavioral changes to assist the process, and the option to either gradully reduce usage  Or abruptly stop.is also discussed. Pt is also encouraged to set specific goals in number of cigarettes used daily, as well as to set a quit date.  

## 2013-12-15 NOTE — Assessment & Plan Note (Addendum)
3 week h/o cough and conngestion, decongestant , PCN and depomedrol  administered

## 2013-12-15 NOTE — Assessment & Plan Note (Addendum)
Uncontrolled , start daily medication for symptom control, flonase prescribed also singulair and prednisone dose pack

## 2014-02-12 IMAGING — CR DG FOOT COMPLETE 3+V*R*
3 series · 3 of 3 positions shown · non-contrast
Comparison: None.

CLINICAL DATA: Right foot pain, swelling, and redness for 5 days
with history of cellulitis in the foot

EXAM:
RIGHT FOOT COMPLETE - 3+ VIEW

[view not recorded (1 of 3)]
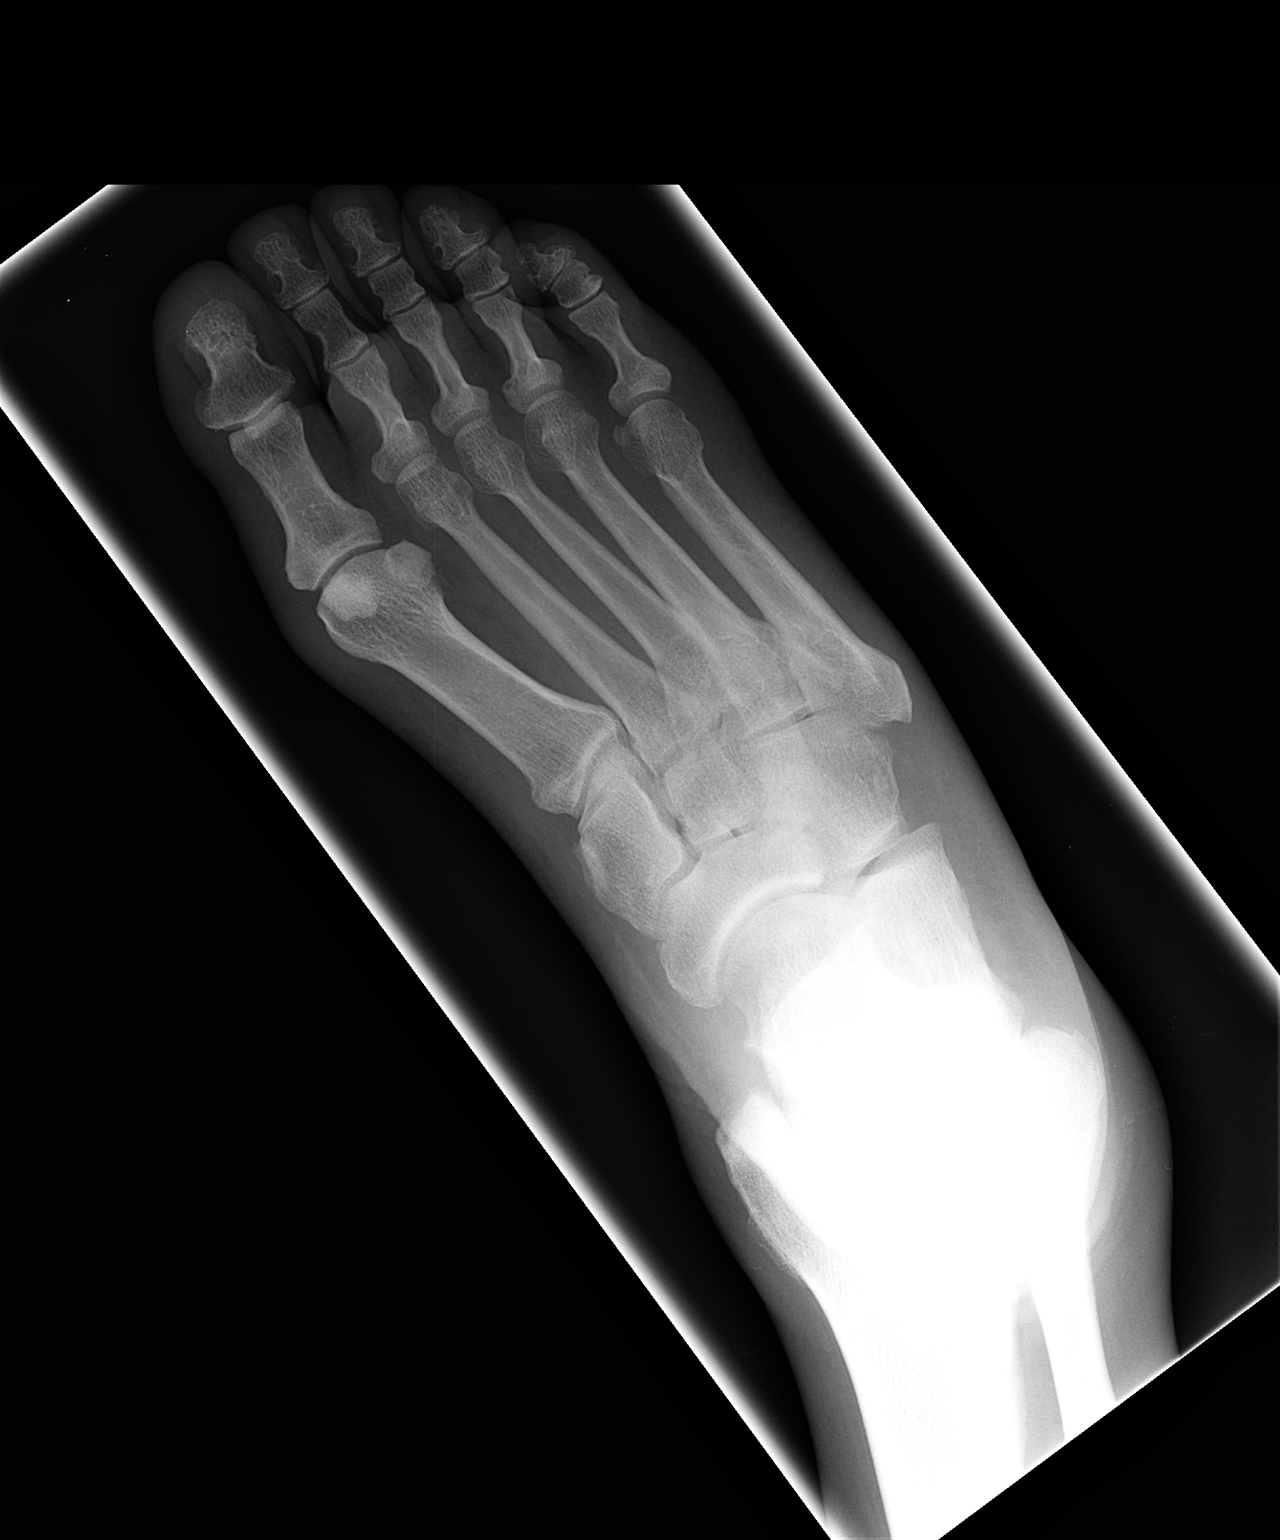

[view not recorded (2 of 3)]
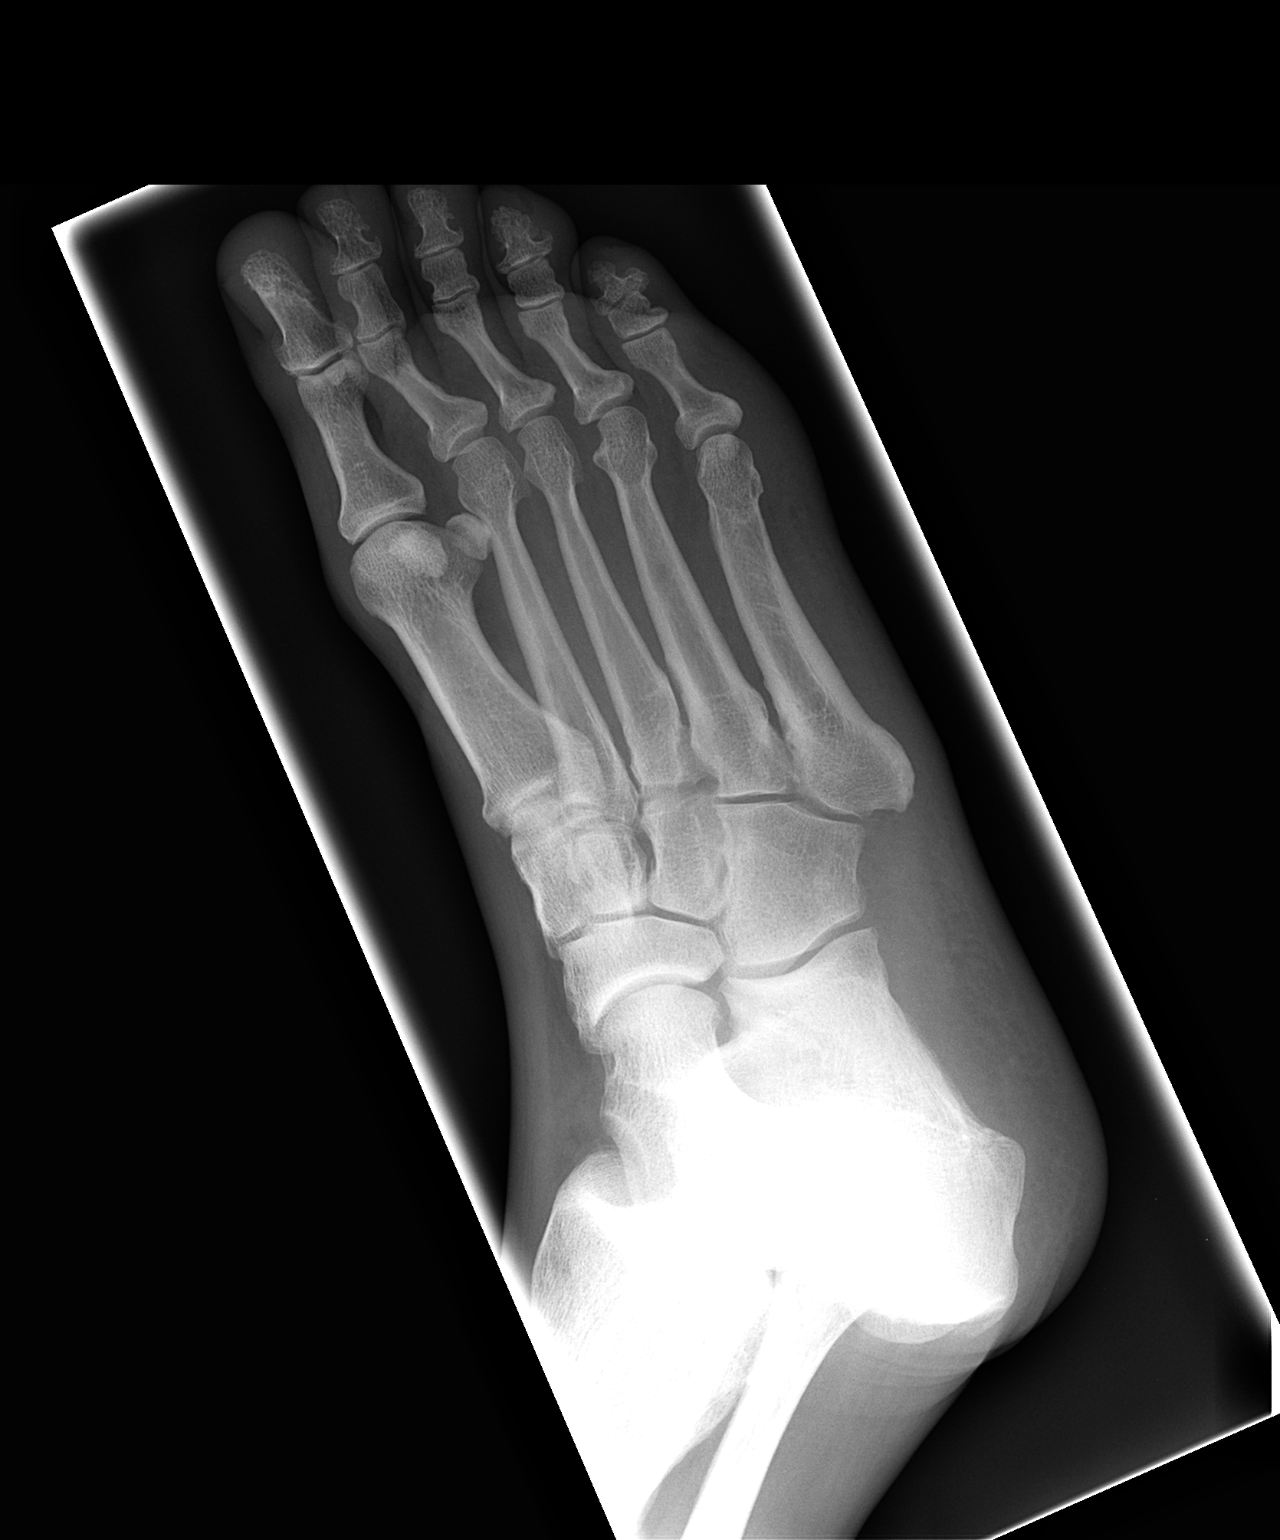

[view not recorded (3 of 3)]
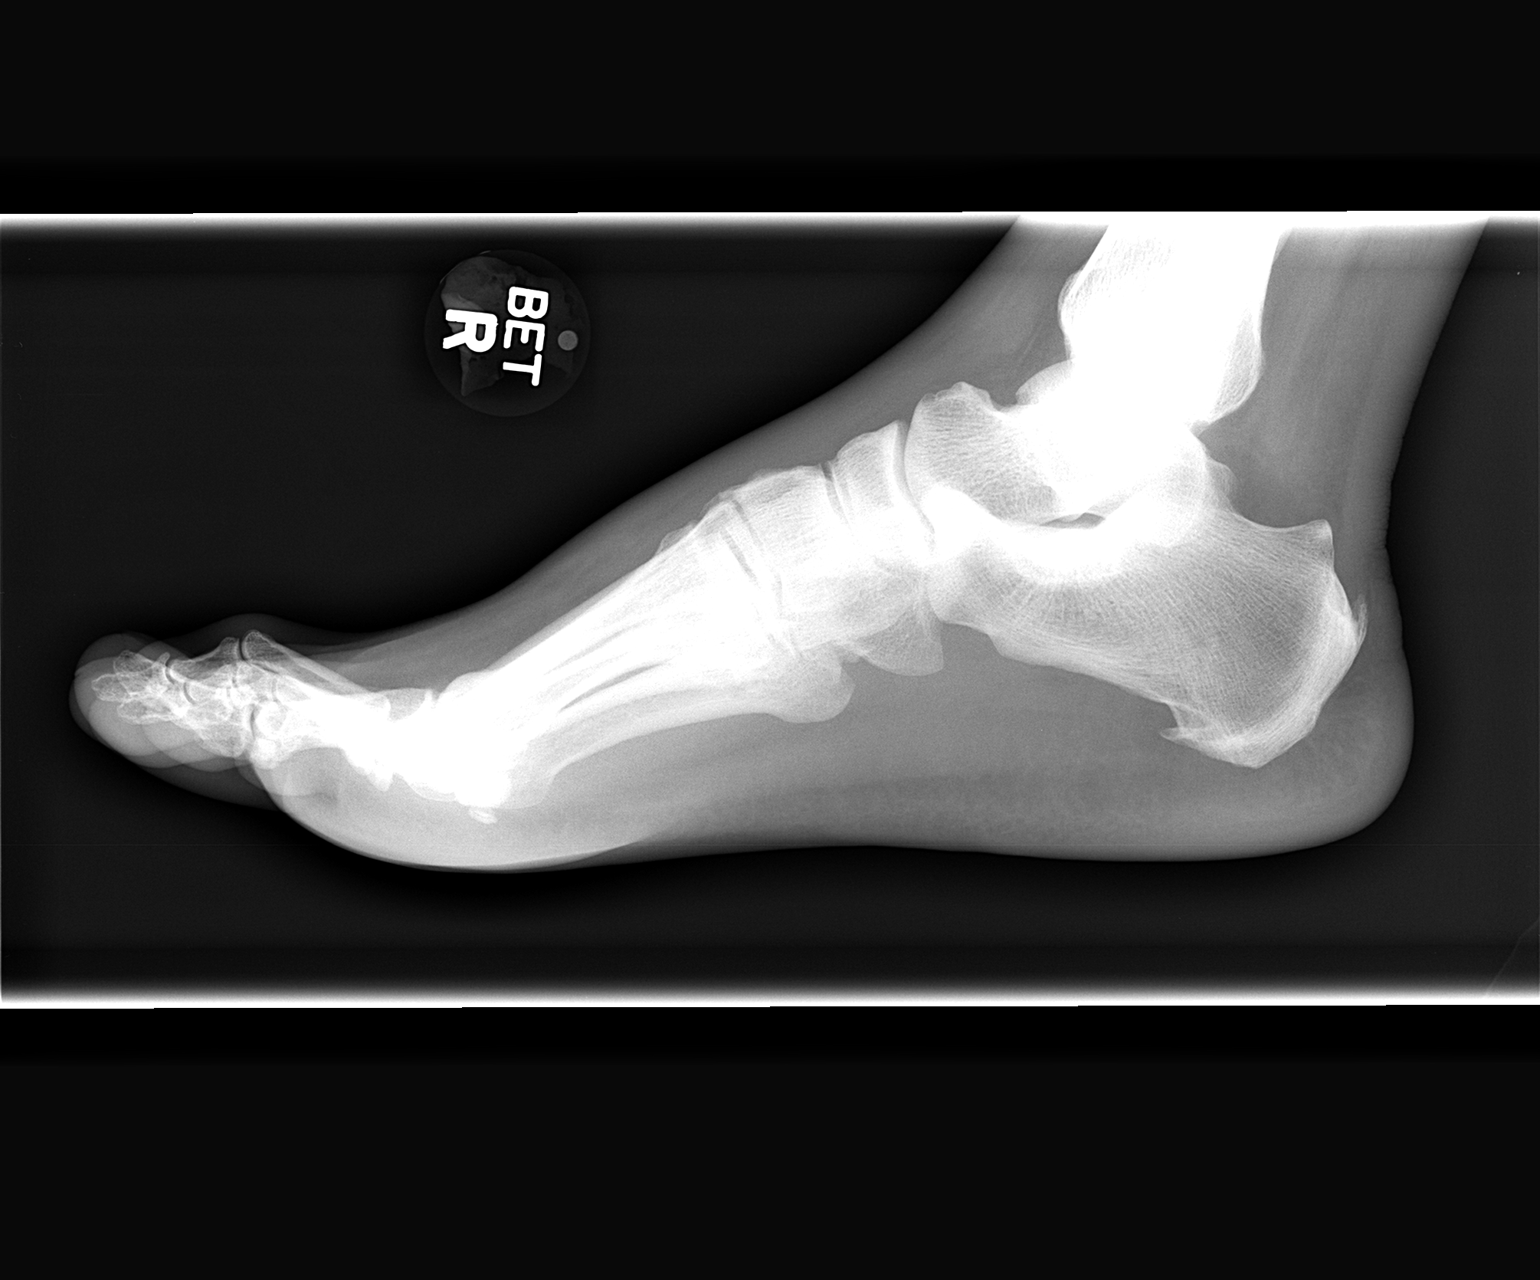

[3 of 3 positions shown; findings below may reference images not displayed]

FINDINGS: No fracture or dislocation. No focal soft tissue abnormality. Small
heel spur.
IMPRESSION: No acute findings

## 2014-04-27 ENCOUNTER — Ambulatory Visit (INDEPENDENT_AMBULATORY_CARE_PROVIDER_SITE_OTHER): Payer: Medicare Other | Admitting: Family Medicine

## 2014-04-27 ENCOUNTER — Encounter: Payer: Self-pay | Admitting: Family Medicine

## 2014-04-27 VITALS — BP 140/90 | HR 80 | Resp 16 | Ht 71.0 in | Wt 146.4 lb

## 2014-04-27 DIAGNOSIS — E785 Hyperlipidemia, unspecified: Secondary | ICD-10-CM

## 2014-04-27 DIAGNOSIS — H543 Unqualified visual loss, both eyes: Secondary | ICD-10-CM | POA: Diagnosis not present

## 2014-04-27 DIAGNOSIS — I10 Essential (primary) hypertension: Secondary | ICD-10-CM

## 2014-04-27 DIAGNOSIS — Z23 Encounter for immunization: Secondary | ICD-10-CM

## 2014-04-27 DIAGNOSIS — Z0000 Encounter for general adult medical examination without abnormal findings: Secondary | ICD-10-CM | POA: Diagnosis not present

## 2014-04-27 NOTE — Progress Notes (Signed)
Subjective:    Patient ID: Wesley Brennan, male    DOB: 10-14-1960, 53 y.o.   MRN: [id number]  HPI Preventive Screening-Counseling & Management   Patient present here today for a Medicare annual wellness visit.   Current Problems (verified)   Medications Prior to Visit Allergies (verified)   PAST HISTORY  Family History (verified)   Social History Never been married, no children, works for an [employer]    Risk Factors  Current exercise habits:  Walks daily and also rides his bicycle   Dietary issues discussed: Heart healthy diet discussed. Eats a lot of fruits and vegetables, limit red meats and carbs. Limits fried foods    Cardiac risk factors:  Father has several heart attacks, 1st on at age 55   Depression Screen  (Note: if answer to either of the following is "Yes", a more complete depression screening is indicated)   Over the past two weeks, have you felt down, depressed or hopeless? No  Over the past two weeks, have you felt little interest or pleasure in doing things? No  Have you lost interest or pleasure in daily life? No  Do you often feel hopeless? No  Do you cry easily over simple problems? No   Activities of Daily Living  In your present state of health, do you have any difficulty performing the following activities?  Driving?: doesn't drive- rides bike  Managing money?: No Feeding yourself?:No Getting from bed to chair?:No Climbing a flight of stairs?:No Preparing food and eating?:No Bathing or showering?:No Getting dressed?:No Getting to the toilet?:No Using the toilet?:No Moving around from place to place?: No  Fall Risk Assessment In the past year have you fallen or had a near fall?:No Are you currently taking any medications that make you dizzy?:No   Hearing Difficulties: No Do you often ask people to speak up or repeat themselves?:No Do you experience ringing or noises in your ears?:No Do you have difficulty understanding soft or  whispered voices?:No  Cognitive Testing  Alert? Yes Normal Appearance?Yes  Oriented to person? Yes Place? Yes  Time? Yes  Displays appropriate judgment?Yes  Can read the correct time from a watch face? yes Are you having problems remembering things?No  Advanced Directives have been discussed with the patient?Yes. No living will but will try to get one done    List the Names of Other Physician/Practitioners you currently use:  None per pt   Indicate any recent Medical Services you may have received from other than [facility] providers in the past year (date may be approximate).   Assessment:    Annual Wellness Exam   Plan:     Medicare Attestation  I have personally reviewed:  The patient's medical and social history  Their use of alcohol, tobacco or illicit drugs  Their current medications and supplements  The patient's functional ability including ADLs,fall risks, home safety risks, cognitive, and hearing and visual impairment  Diet and physical activities  Evidence for depression or mood disorders  The patient's weight, height, BMI, and visual acuity have been recorded in the chart. I have made referrals, counseling, and provided education to the patient based on review of the above and I have provided the patient with a written personalized care plan for preventive services.      Review of Systems     Objective:   Physical Exam BP 140/90  Pulse 80  Resp 16  Ht 5\' 11"  (1.803 m)  Wt 146 lb 6.4 oz (66.407 kg)  BMI 20.43 kg/m2  SpO2 99% Patient alert and oriented and in no cardiopulmonary distress.    Chest: Clear to auscultation bilaterally.  CVS: S1, S2 no murmurs, no S3.Regular rate.    Ext: No edema         Assessment & Plan:  Initial Medicare annual wellness visit Annual exam as documented. Counseling done  re healthy lifestyle involving commitment to 150 minutes exercise per week, heart healthy diet, and attaining healthy weight.The importance of  adequate sleep also discussed. Regular seat belt use and home safety  if patient has them, is also discussed. Changes in health habits are decided on by the patient with goals and time frames  set for achieving them. Immunization and cancer screening needs are specifically addressed at this visit.   HTN (hypertension) Uncontrolled, needs to take med prescribed EKG in office today, no ischemia, no lVH , NSR DASH diet and commitment to daily physical activity for a minimum of 30 minutes discussed and encouraged, as a part of hypertension management. The importance of attaining a healthy weight is also discussed.   Need for prophylactic vaccination and inoculation against influenza Vaccine administered at visit.   Hyperlipidemia LDL goal <100 Hyperlipidemia:Low fat diet discussed and encouraged.  rept hepatic panel to determine when he can resume statin therapy    Vision loss, bilateral Marked vision impairment , states he has been wearing glasses since infancy , referred for eye exam

## 2014-04-27 NOTE — Assessment & Plan Note (Signed)
Hyperlipidemia:Low fat diet discussed and encouraged.  rept hepatic panel to determine when he can resume statin therapy

## 2014-04-27 NOTE — Patient Instructions (Addendum)
F/u in 4.5 month, call if you need me before  Flu vaccine today  You need an eye exam , will also notify your sister  Resume blood pressure medication, you need this  Hepatic panel , tomorrow so I will know if safe for you to resume cholesterol medication   Start aspirin one daily for reduction of heart disease risk  Important to commit to stopping smoking and alcohol, glad you are  Doing better  eKG today

## 2014-04-27 NOTE — Assessment & Plan Note (Signed)
Marked vision impairment , states he has been wearing glasses since infancy , referred for eye exam

## 2014-04-27 NOTE — Assessment & Plan Note (Signed)
Vaccine administered at visit.  

## 2014-04-27 NOTE — Assessment & Plan Note (Addendum)
Uncontrolled, needs to take med prescribed EKG in office today, no ischemia, no lVH , NSR DASH diet and commitment to daily physical activity for a minimum of 30 minutes discussed and encouraged, as a part of hypertension management. The importance of attaining a healthy weight is also discussed.

## 2014-04-27 NOTE — Assessment & Plan Note (Signed)
Annual exam as documented. Counseling done  re healthy lifestyle involving commitment to 150 minutes exercise per week, heart healthy diet, and attaining healthy weight.The importance of adequate sleep also discussed. Regular seat belt use and home safety  if patient has them, is also discussed. Changes in health habits are decided on by the patient with goals and time frames  set for achieving them. Immunization and cancer screening needs are specifically addressed at this visit.

## 2014-04-28 ENCOUNTER — Other Ambulatory Visit: Payer: Self-pay

## 2014-04-28 DIAGNOSIS — I10 Essential (primary) hypertension: Secondary | ICD-10-CM

## 2014-04-28 MED ORDER — AMLODIPINE BESYLATE 2.5 MG PO TABS: 2.5000 mg | ORAL_TABLET | Freq: Every day | ORAL | Status: DC

## 2014-04-29 DIAGNOSIS — E785 Hyperlipidemia, unspecified: Secondary | ICD-10-CM | POA: Diagnosis not present

## 2014-04-29 LAB — HEPATIC FUNCTION PANEL
ALBUMIN: 3.8 g/dL (ref 3.5–5.2)
ALT: 41 U/L (ref 0–53)
AST: 69 U/L — ABNORMAL HIGH (ref 0–37)
Alkaline Phosphatase: 95 U/L (ref 39–117)
BILIRUBIN DIRECT: 0.2 mg/dL (ref 0.0–0.3)
BILIRUBIN TOTAL: 0.8 mg/dL (ref 0.2–1.2)
Indirect Bilirubin: 0.6 mg/dL (ref 0.2–1.2)
Total Protein: 6.8 g/dL (ref 6.0–8.3)

## 2014-05-13 ENCOUNTER — Emergency Department (HOSPITAL_COMMUNITY)
Admission: EM | Admit: 2014-05-13 | Discharge: 2014-05-13 | Disposition: A | Discharge status: Home / Self Care | Payer: Medicare Other | Attending: Emergency Medicine | Admitting: Emergency Medicine

## 2014-05-13 ENCOUNTER — Encounter (HOSPITAL_COMMUNITY): Payer: Self-pay | Admitting: Emergency Medicine

## 2014-05-13 DIAGNOSIS — R51 Headache: Secondary | ICD-10-CM | POA: Diagnosis not present

## 2014-05-13 DIAGNOSIS — Z720 Tobacco use: Secondary | ICD-10-CM | POA: Insufficient documentation

## 2014-05-13 DIAGNOSIS — I10 Essential (primary) hypertension: Secondary | ICD-10-CM | POA: Diagnosis not present

## 2014-05-13 DIAGNOSIS — R519 Headache, unspecified: Secondary | ICD-10-CM

## 2014-05-13 DIAGNOSIS — Z8639 Personal history of other endocrine, nutritional and metabolic disease: Secondary | ICD-10-CM | POA: Insufficient documentation

## 2014-05-13 NOTE — ED Notes (Signed)
Pt laying on stretcher, states he feels much better now, since laying down and resting. Feels like his BP went up. PCP took of BP meds

## 2014-05-13 NOTE — ED Provider Notes (Signed)
CSN: [id number]     Arrival date & time 05/13/14  0935 History  This chart was scribed for Nat Christen, MD by Rayfield Citizen, ED Scribe. This patient was seen in room APA03/APA03 and the patient's care was started at 12:50 PM.    Chief Complaint  Patient presents with  . Headache   The history is provided by the patient. No language interpreter was used.    HPI Comments: Wesley Brennan is a 53 y.o. male who presents to the Emergency Department complaining of headache. Patient reports that his blood pressure was high this morning; he had an appointment with his PCP several days ago, at which time he was told to stop his BP and cholesterol meds for later liver evaluations. Presumably, his headache this morning was due to  high blood pressure after stopping his medication - he states that he was aware of this fact, but an argument with his boss prompted him to come to the ED for a note today. Patient would like to be discharged at this time.    Past Medical History  Diagnosis Date  . Hypercholesteremia   . Substance abuse     alcohol, nicotine, h/o coacaine and marijuana  . Hypertension    Past Surgical History  Procedure Laterality Date  . Hernia repair  2706 approx    umbilical hernia  . Elbow fusion  1993    left elbow dislocation  . Elbow fusion Left 1979   Family History  Problem Relation Age of Onset  . Adopted: Yes  . Cancer Mother 66    Bone   . Diabetes Mother   . Hypertension Sister   . Stroke Father 67  . Heart disease Father 54  . Diabetes Father    History  Substance Use Topics  . Smoking status: Current Every Day Smoker -- 0.50 packs/day    Types: Cigarettes  . Smokeless tobacco: Not on file     Comment: reduce by 1 cigarette each week  . Alcohol Use: 1.8 oz/week    3 Cans of beer per week     Comment: everyday    Review of Systems  Neurological: Positive for headaches.    A complete 10 system review of systems was obtained and all systems are negative  except as noted in the HPI and PMH.   Allergies  Review of patient's allergies indicates no known allergies.  Home Medications   Prior to Admission medications   Not on File   BP 141/97  Pulse 85  Temp(Src) 98.7 F (37.1 C) (Oral)  Resp 16  Ht 5\' 11"  (1.803 m)  Wt 146 lb (66.225 kg)  BMI 20.37 kg/m2  SpO2 100% Physical Exam  Nursing note and vitals reviewed. Constitutional: He is oriented to person, place, and time. He appears well-developed and well-nourished.  HENT:  Head: Normocephalic and atraumatic.  Eyes: Conjunctivae and EOM are normal. Pupils are equal, round, and reactive to light.  Neck: Normal range of motion. Neck supple.  Cardiovascular: Normal rate, regular rhythm and normal heart sounds.   Pulmonary/Chest: Effort normal and breath sounds normal.  Abdominal: Soft. Bowel sounds are normal.  Musculoskeletal: Normal range of motion.  Neurological: He is alert and oriented to person, place, and time.  Skin: Skin is warm and dry.  Psychiatric: He has a normal mood and affect. His behavior is normal.    ED Course  Procedures   DIAGNOSTIC STUDIES: Oxygen Saturation is 100% on RA, normal by my interpretation.  COORDINATION OF CARE: 12:53 PM Discussed treatment plan with pt at bedside and pt agreed to plan.  Results for orders placed in visit on 04/27/14  HEPATIC FUNCTION PANEL      Result Value Ref Range   Total Bilirubin 0.8  0.2 - 1.2 mg/dL   Bilirubin, Direct 0.2  0.0 - 0.3 mg/dL   Indirect Bilirubin 0.6  0.2 - 1.2 mg/dL   Alkaline Phosphatase 95  39 - 117 U/L   AST 69 (*) 0 - 37 U/L   ALT 41  0 - 53 U/L   Total Protein 6.8  6.0 - 8.3 g/dL   Albumin 3.8  3.5 - 5.2 g/dL   No results found.  Labs Review Labs Reviewed - No data to display  Imaging Review No results found.   EKG Interpretation None      MDM   Final diagnoses:  Headache, unspecified headache type   Headache has improved. Blood pressure high normal. No neurological  deficits.   I personally performed the services described in this documentation, which was scribed in my presence. The recorded information has been reviewed and is accurate.      Nat Christen, MD 05/14/14 [phone]

## 2014-05-13 NOTE — ED Notes (Signed)
Pt states headache with nausea began this morning before work. Pt states his boss got mad stating, "He thought I was just out messing around." Pt rode bicycle here. Pt states he will need a doctor's excuse to give his boss.

## 2014-09-09 ENCOUNTER — Encounter: Payer: Self-pay | Admitting: Family Medicine

## 2014-09-09 ENCOUNTER — Ambulatory Visit (INDEPENDENT_AMBULATORY_CARE_PROVIDER_SITE_OTHER): Payer: Medicare Other | Admitting: Family Medicine

## 2014-09-09 VITALS — BP 118/80 | HR 90 | Resp 16 | Ht 71.0 in | Wt 159.0 lb

## 2014-09-09 DIAGNOSIS — Z1159 Encounter for screening for other viral diseases: Secondary | ICD-10-CM

## 2014-09-09 DIAGNOSIS — M25522 Pain in left elbow: Secondary | ICD-10-CM

## 2014-09-09 DIAGNOSIS — F17200 Nicotine dependence, unspecified, uncomplicated: Secondary | ICD-10-CM

## 2014-09-09 DIAGNOSIS — F17210 Nicotine dependence, cigarettes, uncomplicated: Secondary | ICD-10-CM

## 2014-09-09 DIAGNOSIS — Z125 Encounter for screening for malignant neoplasm of prostate: Secondary | ICD-10-CM

## 2014-09-09 DIAGNOSIS — E785 Hyperlipidemia, unspecified: Secondary | ICD-10-CM

## 2014-09-09 DIAGNOSIS — F1024 Alcohol dependence with alcohol-induced mood disorder: Secondary | ICD-10-CM

## 2014-09-09 DIAGNOSIS — Z23 Encounter for immunization: Secondary | ICD-10-CM

## 2014-09-09 LAB — COMPREHENSIVE METABOLIC PANEL
ALT: 22 U/L (ref 0–53)
AST: 31 U/L (ref 0–37)
Albumin: 4.2 g/dL (ref 3.5–5.2)
Alkaline Phosphatase: 67 U/L (ref 39–117)
BUN: 17 mg/dL (ref 6–23)
CALCIUM: 8.9 mg/dL (ref 8.4–10.5)
CHLORIDE: 108 meq/L (ref 96–112)
CO2: 23 meq/L (ref 19–32)
CREATININE: 0.98 mg/dL (ref 0.50–1.35)
Glucose, Bld: 73 mg/dL (ref 70–99)
POTASSIUM: 4.1 meq/L (ref 3.5–5.3)
SODIUM: 142 meq/L (ref 135–145)
TOTAL PROTEIN: 7.1 g/dL (ref 6.0–8.3)
Total Bilirubin: 0.7 mg/dL (ref 0.2–1.2)

## 2014-09-09 LAB — LIPID PANEL
Cholesterol: 209 mg/dL — ABNORMAL HIGH (ref 0–200)
HDL: 91 mg/dL (ref >=40)
LDL Cholesterol: 77 mg/dL (ref 0–99)
TRIGLYCERIDES: 203 mg/dL — AB (ref <150)
Total CHOL/HDL Ratio: 2.3 Ratio
VLDL: 41 mg/dL — ABNORMAL HIGH (ref 0–40)

## 2014-09-09 LAB — CBC WITH DIFFERENTIAL/PLATELET
BASOS ABS: 0 10*3/uL (ref 0.0–0.1)
BASOS PCT: 1 % (ref 0–1)
EOS ABS: 0.7 10*3/uL (ref 0.0–0.7)
EOS PCT: 16 % — AB (ref 0–5)
HCT: 39.9 % (ref 39.0–52.0)
Hemoglobin: 13.8 g/dL (ref 13.0–17.0)
Lymphocytes Relative: 49 % — ABNORMAL HIGH (ref 12–46)
Lymphs Abs: 2.1 10*3/uL (ref 0.7–4.0)
MCH: 33.8 pg (ref 26.0–34.0)
MCHC: 34.6 g/dL (ref 30.0–36.0)
MCV: 97.8 fL (ref 78.0–100.0)
MPV: 9.6 fL (ref 8.6–12.4)
Monocytes Absolute: 0.3 10*3/uL (ref 0.1–1.0)
Monocytes Relative: 7 % (ref 3–12)
NEUTROS PCT: 27 % — AB (ref 43–77)
Neutro Abs: 1.2 10*3/uL — ABNORMAL LOW (ref 1.7–7.7)
PLATELETS: 208 10*3/uL (ref 150–400)
RBC: 4.08 MIL/uL — ABNORMAL LOW (ref 4.22–5.81)
RDW: 12.7 % (ref 11.5–15.5)
WBC: 4.3 10*3/uL (ref 4.0–10.5)

## 2014-09-09 LAB — TSH: TSH: 0.79 u[IU]/mL (ref 0.350–4.500)

## 2014-09-09 MED ORDER — PREDNISONE (PAK) 5 MG PO TABS: ORAL_TABLET | ORAL | Status: DC

## 2014-09-09 MED ORDER — INDOMETHACIN 50 MG PO CAPS: 50.0000 mg | ORAL_CAPSULE | Freq: Two times a day (BID) | ORAL | Status: DC

## 2014-09-09 MED ORDER — KETOROLAC TROMETHAMINE 60 MG/2ML IM SOLN
60.0000 mg | Freq: Once | INTRAMUSCULAR | Status: AC
  Administered 2014-09-09: 60 mg via INTRAMUSCULAR

## 2014-09-09 MED ORDER — RANITIDINE HCL 150 MG PO TABS: 150.0000 mg | ORAL_TABLET | Freq: Two times a day (BID) | ORAL | Status: DC

## 2014-09-09 MED ORDER — METHYLPREDNISOLONE ACETATE 80 MG/ML IJ SUSP
80.0000 mg | Freq: Once | INTRAMUSCULAR | Status: AC
  Administered 2014-09-09: 80 mg via INTRAMUSCULAR

## 2014-09-09 NOTE — Assessment & Plan Note (Signed)
Reports marked improvement in dependence , bought 12 pack beer last night expect to buy 40 oz tonight , states his company finished the beer

## 2014-09-09 NOTE — Patient Instructions (Addendum)
Annual physical exam in 4.5 month, call if you  Need me before  Prevnar today  Labs today  Xray of left elbow today, 2 injections in office for pain and medications (3) prescribed for the pain)  Call 2nd week in March if elbow still hurts, you will be referred to ortho in [place] back on cigarettes and alcohol, you need to quit, Thankful you are aware that you need to do this and thatt you are working on [employer] for choosing [facility], we consider it a privelige to serve you.

## 2014-09-09 NOTE — Assessment & Plan Note (Signed)
1 week h/o increased left elbow pain, uncertain if he has had an injury. X ray and anti inflammatories in office and prescribed

## 2014-09-09 NOTE — Assessment & Plan Note (Signed)
Unchanged. Patient counseled for approximately 5 minutes regarding the health risks of ongoing nicotine use, specifically all types of cancer, heart disease, stroke and respiratory failure. The options available for help with cessation ,the behavioral changes to assist the process, and the option to either gradully reduce usage  Or abruptly stop.is also discussed. Pt is also encouraged to set specific goals in number of cigarettes used daily, as well as to set a quit date.  

## 2014-09-09 NOTE — Assessment & Plan Note (Signed)
Vaccine administered at visit.  

## 2014-09-10 LAB — PSA, MEDICARE: PSA: 1.47 ng/mL (ref <=4.00)

## 2014-09-10 LAB — HIV ANTIBODY (ROUTINE TESTING W REFLEX): HIV: NONREACTIVE

## 2014-09-11 NOTE — Assessment & Plan Note (Signed)
Marked improvemetn on no meds, which is excellent Hyperlipidemia:Low fat diet discussed and encouraged.

## 2014-09-11 NOTE — Progress Notes (Signed)
   Subjective:    Patient ID: Wesley Brennan, male    DOB: 1960-11-12, 54 y.o.   MRN: [id number]  HPI The PT is here for follow up and re-evaluation of chronic medical conditions, medication management and review of any available recent lab and radiology data.  Preventive health is updated, specifically  Cancer screening and Immunization.    1 week h/o left elbow pain and reduced mobility, no recent trauma. States he wants  "this metal box" put in years ago removed, as he was told that it "could be removed eventually" Still smoking and drinking, full questioning reveals denial on his part as to the extent of both Approximately 10 mins is spent counseling re the need to cut back and stop oth habits, no commitment as far as a date bu he does have the intent      Review of Systems See HPI Denies recent fever or chills. Denies sinus pressure, nasal congestion, ear pain or sore throat. Denies chest congestion, productive cough or wheezing. Denies chest pains, palpitations and leg swelling Denies abdominal pain, nausea, vomiting,diarrhea or constipation.   Denies dysuria, frequency, hesitancy or incontinence.  Denies headaches, seizures, numbness, or tingling. Denies depression, anxiety or insomnia. Denies skin break down or rash.        Objective:   Physical Exam BP 118/80 mmHg  Pulse 90  Resp 16  Ht 5\' 11"  (1.803 m)  Wt 159 lb (72.122 kg)  BMI 22.19 kg/m2  SpO2 99% Patient alert and oriented and in no cardiopulmonary distress.  HEENT: No facial asymmetry, EOMI,   oropharynx pink and moist.  Neck supple no JVD, no mass.  Chest: Clear to auscultation bilaterally.Decreased though adequate air entry  CVS: S1, S2 no murmurs, no S3.Regular rate.  ABD: Soft non tender.   Ext: No edema  MS: Adequate ROM spine, shoulders, hips and knees.Decreased ROM left elbow, no erythema or warmth, deformed from multiple prior surgeries  Skin: Intact, no ulcerations or rash  noted.  Psych: Good eye contact, normal affect. Memory intact not anxious or depressed appearing.  CNS: CN 2-12 intact, power,  normal throughout.no focal deficits noted.        Assessment & Plan:  NICOTINE ADDICTION Unchanged Patient counseled for approximately 5 minutes regarding the health risks of ongoing nicotine use, specifically all types of cancer, heart disease, stroke and respiratory failure. The options available for help with cessation ,the behavioral changes to assist the process, and the option to either gradully reduce usage  Or abruptly stop.is also discussed. Pt is also encouraged to set specific goals in number of cigarettes used daily, as well as to set a quit date.    Alcohol dependence Reports marked improvement in dependence , bought 12 pack beer last night expect to buy 40 oz tonight , states his company finished the beer   Need for vaccination with 13-polyvalent pneumococcal conjugate vaccine Vaccine administered at visit.    Pain in left elbow 1 week h/o increased left elbow pain, uncertain if he has had an injury. X ray and anti inflammatories in office and prescribed   Hyperlipidemia LDL goal <100 Marked improvemetn on no meds, which is excellent Hyperlipidemia:Low fat diet discussed and encouraged.

## 2014-10-19 ENCOUNTER — Emergency Department (HOSPITAL_COMMUNITY)
Admission: EM | Admit: 2014-10-19 | Discharge: 2014-10-19 | Discharge status: Against Medical Advice | Payer: Medicare Other | Attending: Emergency Medicine | Admitting: Emergency Medicine

## 2014-10-19 ENCOUNTER — Emergency Department (HOSPITAL_COMMUNITY): Payer: Medicare Other

## 2014-10-19 ENCOUNTER — Encounter (HOSPITAL_COMMUNITY): Payer: Self-pay | Admitting: Emergency Medicine

## 2014-10-19 DIAGNOSIS — Z8639 Personal history of other endocrine, nutritional and metabolic disease: Secondary | ICD-10-CM | POA: Insufficient documentation

## 2014-10-19 DIAGNOSIS — Z7952 Long term (current) use of systemic steroids: Secondary | ICD-10-CM | POA: Insufficient documentation

## 2014-10-19 DIAGNOSIS — I10 Essential (primary) hypertension: Secondary | ICD-10-CM | POA: Diagnosis not present

## 2014-10-19 DIAGNOSIS — Z87828 Personal history of other (healed) physical injury and trauma: Secondary | ICD-10-CM | POA: Diagnosis not present

## 2014-10-19 DIAGNOSIS — M25522 Pain in left elbow: Secondary | ICD-10-CM | POA: Insufficient documentation

## 2014-10-19 DIAGNOSIS — Z9889 Other specified postprocedural states: Secondary | ICD-10-CM | POA: Insufficient documentation

## 2014-10-19 DIAGNOSIS — Z720 Tobacco use: Secondary | ICD-10-CM | POA: Diagnosis not present

## 2014-10-19 NOTE — ED Notes (Signed)
Patient came to nursing station and stated "I have to go to work. I don't have time to wait any longer. I'll just see Dr Moshe Cipro." Patient left without signing AMA or discharge.

## 2014-10-19 NOTE — ED Provider Notes (Signed)
CSN: [id number]     Arrival date & time 10/19/14  0932 History   First MD Initiated Contact with Patient 10/19/14 [phone]     Chief Complaint  Patient presents with  . Elbow Pain     (Consider location/radiation/quality/duration/timing/severity/associated sxs/prior Treatment) Patient is a 54 y.o. male presenting with arm injury. The history is provided by the patient.  Arm Injury Location:  Elbow Time since incident:  2 hours Injury: yes   Elbow location:  L elbow Pain details:    Quality:  Aching   Severity:  Moderate   Onset quality:  Sudden   Timing:  Constant   Progression:  Worsening Relieved by:  Nothing Worsened by:  Movement Ineffective treatments:  Acetaminophen AUDLEY HINOJOS is a 54 y.o. male who presents to the ED with left elbow pain. He reports having chronic pain since surgery in 1979 but has an occasional flare up. He states that this morning he was trying to crank a leaf blower and felt sudden sharp pain in the elbow. He states that he felt like he needs an x-ray to be sure he has not messed anything up. He denies any other injuries.   Past Medical History  Diagnosis Date  . Hypercholesteremia   . Substance abuse     alcohol, nicotine, h/o coacaine and marijuana  . Hypertension    Past Surgical History  Procedure Laterality Date  . Hernia repair  3220 approx    umbilical hernia  . Elbow fusion  1993    left elbow dislocation  . Elbow fusion Left 1979   Family History  Problem Relation Age of Onset  . Adopted: Yes  . Cancer Mother 50    Bone   . Diabetes Mother   . Hypertension Sister   . Stroke Father 51  . Heart disease Father 65  . Diabetes Father    History  Substance Use Topics  . Smoking status: Current Every Day Smoker -- 0.50 packs/day    Types: Cigarettes  . Smokeless tobacco: Not on file     Comment: reduce by 1 cigarette each week  . Alcohol Use: 1.2 oz/week    2 Cans of beer per week     Comment: everyday    Review of  Systems Negative except as stated in HPI   Allergies  Review of patient's allergies indicates no known allergies.  Home Medications   Prior to Admission medications   Medication Sig Start Date End Date Taking? Authorizing Provider  indomethacin (INDOCIN) 50 MG capsule Take 1 capsule (50 mg total) by mouth 2 (two) times daily with a meal. Patient not taking: Reported on 10/19/2014 09/09/14   Fayrene Helper, MD  predniSONE (STERAPRED UNI-PAK) 5 MG TABS tablet Use as directed Patient not taking: Reported on 10/19/2014 09/09/14   Fayrene Helper, MD  ranitidine (ZANTAC) 150 MG tablet Take 1 tablet (150 mg total) by mouth 2 (two) times daily. Patient not taking: Reported on 10/19/2014 09/09/14   Fayrene Helper, MD   BP 136/87 mmHg  Pulse 79  Temp(Src) 98.2 F (36.8 C) (Oral)  Resp 18  Ht 5\' 11"  (1.803 m)  Wt 165 lb (74.844 kg)  BMI 23.02 kg/m2  SpO2 99% Physical Exam  Constitutional: He is oriented to person, place, and time. He appears well-developed and well-nourished. No distress.  HENT:  Head: Normocephalic.  Eyes: Conjunctivae and EOM are normal.  Neck: Neck supple.  Cardiovascular: Normal rate.   Pulmonary/Chest: Effort normal.  Abdominal:  Soft. There is no tenderness.  Musculoskeletal:       Left elbow: He exhibits decreased range of motion, swelling and deformity. Tenderness found.       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 Radial pulse 2+, adequate circulation, good touch sensation. Full rang of motion wrist and shoulder. Pain with palpation and movement of the left elbow.   Neurological: He is alert and oriented to person, place, and time. No cranial nerve deficit.  Skin: Skin is warm and dry.  Psychiatric: He has a normal mood and affect. His behavior is normal.  Nursing note and vitals reviewed.   ED Course  Procedures (including critical care time) Labs Review X-ray of left elbow ordered per patient request.  Labs  Reviewed - No data to display   MDM  54 y.o. male with left elbow pain s/p injury earlier this morning.  Patient left prior to going for x-ray. He told the RN that he had to go to work.   Final diagnoses:  Left elbow pain       Ashley Murrain, NP 10/19/14 1723  Lacretia Leigh, MD 10/23/14 1539

## 2014-10-19 NOTE — ED Notes (Addendum)
Patient complaining of pain in left elbow. States "I hurt it in 1979 and it just started hurting again." Denies recent injury.

## 2014-10-19 NOTE — ED Notes (Signed)
PT c/o chronic elbow pain for past surgery flared up x2 days ago while doing yard work. PT denies any chronic pain medication use.

## 2015-01-16 ENCOUNTER — Emergency Department (HOSPITAL_COMMUNITY): Payer: Medicare Other

## 2015-01-16 ENCOUNTER — Emergency Department (HOSPITAL_COMMUNITY)
Admission: EM | Admit: 2015-01-16 | Discharge: 2015-01-16 | Disposition: A | Discharge status: Home / Self Care | Payer: Medicare Other | Attending: Emergency Medicine | Admitting: Emergency Medicine

## 2015-01-16 ENCOUNTER — Encounter (HOSPITAL_COMMUNITY): Payer: Self-pay | Admitting: *Deleted

## 2015-01-16 DIAGNOSIS — S3993XA Unspecified injury of pelvis, initial encounter: Secondary | ICD-10-CM | POA: Diagnosis not present

## 2015-01-16 DIAGNOSIS — I10 Essential (primary) hypertension: Secondary | ICD-10-CM | POA: Insufficient documentation

## 2015-01-16 DIAGNOSIS — S3992XA Unspecified injury of lower back, initial encounter: Secondary | ICD-10-CM | POA: Insufficient documentation

## 2015-01-16 DIAGNOSIS — Y998 Other external cause status: Secondary | ICD-10-CM | POA: Insufficient documentation

## 2015-01-16 DIAGNOSIS — S0181XA Laceration without foreign body of other part of head, initial encounter: Secondary | ICD-10-CM | POA: Diagnosis not present

## 2015-01-16 DIAGNOSIS — R4182 Altered mental status, unspecified: Secondary | ICD-10-CM | POA: Diagnosis not present

## 2015-01-16 DIAGNOSIS — S0990XA Unspecified injury of head, initial encounter: Secondary | ICD-10-CM | POA: Diagnosis not present

## 2015-01-16 DIAGNOSIS — S199XXA Unspecified injury of neck, initial encounter: Secondary | ICD-10-CM | POA: Diagnosis not present

## 2015-01-16 DIAGNOSIS — S299XXA Unspecified injury of thorax, initial encounter: Secondary | ICD-10-CM | POA: Insufficient documentation

## 2015-01-16 DIAGNOSIS — F1010 Alcohol abuse, uncomplicated: Secondary | ICD-10-CM | POA: Diagnosis not present

## 2015-01-16 DIAGNOSIS — Y9389 Activity, other specified: Secondary | ICD-10-CM | POA: Insufficient documentation

## 2015-01-16 DIAGNOSIS — Z8639 Personal history of other endocrine, nutritional and metabolic disease: Secondary | ICD-10-CM | POA: Diagnosis not present

## 2015-01-16 DIAGNOSIS — F1410 Cocaine abuse, uncomplicated: Secondary | ICD-10-CM | POA: Insufficient documentation

## 2015-01-16 DIAGNOSIS — Y92009 Unspecified place in unspecified non-institutional (private) residence as the place of occurrence of the external cause: Secondary | ICD-10-CM | POA: Insufficient documentation

## 2015-01-16 DIAGNOSIS — S098XXA Other specified injuries of head, initial encounter: Secondary | ICD-10-CM | POA: Diagnosis not present

## 2015-01-16 DIAGNOSIS — Z720 Tobacco use: Secondary | ICD-10-CM | POA: Insufficient documentation

## 2015-01-16 DIAGNOSIS — W19XXXA Unspecified fall, initial encounter: Secondary | ICD-10-CM

## 2015-01-16 DIAGNOSIS — W102XXA Fall (on)(from) incline, initial encounter: Secondary | ICD-10-CM | POA: Insufficient documentation

## 2015-01-16 LAB — COMPREHENSIVE METABOLIC PANEL
ALK PHOS: 85 U/L (ref 38–126)
ALT: 24 U/L (ref 17–63)
AST: 35 U/L (ref 15–41)
Albumin: 4.2 g/dL (ref 3.5–5.0)
Anion gap: 11 (ref 5–15)
BUN: 20 mg/dL (ref 6–20)
CHLORIDE: 104 mmol/L (ref 101–111)
CO2: 21 mmol/L — AB (ref 22–32)
Calcium: 8.8 mg/dL — ABNORMAL LOW (ref 8.9–10.3)
Creatinine, Ser: 1 mg/dL (ref 0.61–1.24)
GFR calc non Af Amer: >60 mL/min (ref >60)
Glucose, Bld: 90 mg/dL (ref 65–99)
POTASSIUM: 3.4 mmol/L — AB (ref 3.5–5.1)
Sodium: 136 mmol/L (ref 135–145)
Total Bilirubin: 0.8 mg/dL (ref 0.3–1.2)
Total Protein: 7.7 g/dL (ref 6.5–8.1)

## 2015-01-16 LAB — CBC WITH DIFFERENTIAL/PLATELET
Basophils Absolute: 0 10*3/uL (ref 0.0–0.1)
Basophils Relative: 0 % (ref 0–1)
Eosinophils Absolute: 0.6 10*3/uL (ref 0.0–0.7)
Eosinophils Relative: 9 % — ABNORMAL HIGH (ref 0–5)
HCT: 40.4 % (ref 39.0–52.0)
Hemoglobin: 13.5 g/dL (ref 13.0–17.0)
LYMPHS ABS: 2.5 10*3/uL (ref 0.7–4.0)
Lymphocytes Relative: 36 % (ref 12–46)
MCH: 32.5 pg (ref 26.0–34.0)
MCHC: 33.4 g/dL (ref 30.0–36.0)
MCV: 97.3 fL (ref 78.0–100.0)
Monocytes Absolute: 0.3 10*3/uL (ref 0.1–1.0)
Monocytes Relative: 5 % (ref 3–12)
NEUTROS PCT: 50 % (ref 43–77)
Neutro Abs: 3.4 10*3/uL (ref 1.7–7.7)
Platelets: 239 10*3/uL (ref 150–400)
RBC: 4.15 MIL/uL — AB (ref 4.22–5.81)
RDW: 12.2 % (ref 11.5–15.5)
WBC: 6.9 10*3/uL (ref 4.0–10.5)

## 2015-01-16 LAB — RAPID URINE DRUG SCREEN, HOSP PERFORMED
AMPHETAMINES: NOT DETECTED
BENZODIAZEPINES: NOT DETECTED
Barbiturates: NOT DETECTED
Cocaine: POSITIVE — AB
Opiates: NOT DETECTED
Tetrahydrocannabinol: NOT DETECTED

## 2015-01-16 LAB — ETHANOL: Alcohol, Ethyl (B): 207 mg/dL — ABNORMAL HIGH (ref <5)

## 2015-01-16 MED ORDER — SODIUM CHLORIDE 0.9 % IV BOLUS (SEPSIS)
1000.0000 mL | Freq: Once | INTRAVENOUS | Status: AC
  Administered 2015-01-16: 1000 mL via INTRAVENOUS

## 2015-01-16 NOTE — ED Notes (Signed)
Patient able to verbalize date,time and what happened to him. Alert and oriented to time,place ,incident and to self. He was able to ambulate without assistance and no stumbling

## 2015-01-16 NOTE — ED Notes (Signed)
Patient transported to CT. With Security

## 2015-01-16 NOTE — ED Notes (Signed)
Lsb removed by Dr Roderic Palau, c-spine maintained entire time, cms intact pre and post removal of LSB
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
## 2015-01-16 NOTE — ED Notes (Signed)
Patient yelling and trying to get out of bed and pulled of neck collar. Placed collar back on. Patient states that he is hungry, explained that he can not have anything to eat or drink til he goes to CT. States that he is ready to go home that he is fine.

## 2015-01-16 NOTE — Discharge Instructions (Signed)
Clean abrasions twice a day with soap and water.  Sutures out in one week.  No more alcohol.   Motrin for pain

## 2015-01-16 NOTE — ED Notes (Signed)
Patient repeatedly pulls off cervical collar and sitting up in stretcher

## 2015-01-17 ENCOUNTER — Telehealth: Payer: Self-pay | Admitting: Family Medicine

## 2015-01-17 NOTE — Telephone Encounter (Signed)
Pls sched ED f/u appt in 2 to 3 weeks
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
## 2015-01-19 NOTE — Telephone Encounter (Signed)
Patient has an appointment for 7.18.2016 scheduled here

## 2015-01-23 ENCOUNTER — Encounter (HOSPITAL_COMMUNITY): Payer: Self-pay | Admitting: Cardiology

## 2015-01-23 ENCOUNTER — Emergency Department (HOSPITAL_COMMUNITY)
Admission: EM | Admit: 2015-01-23 | Discharge: 2015-01-23 | Disposition: A | Discharge status: Home / Self Care | Payer: Medicare Other | Attending: Emergency Medicine | Admitting: Emergency Medicine

## 2015-01-23 DIAGNOSIS — I10 Essential (primary) hypertension: Secondary | ICD-10-CM | POA: Diagnosis not present

## 2015-01-23 DIAGNOSIS — Z8639 Personal history of other endocrine, nutritional and metabolic disease: Secondary | ICD-10-CM | POA: Diagnosis not present

## 2015-01-23 DIAGNOSIS — Z720 Tobacco use: Secondary | ICD-10-CM | POA: Diagnosis not present

## 2015-01-23 DIAGNOSIS — Z4802 Encounter for removal of sutures: Secondary | ICD-10-CM | POA: Insufficient documentation

## 2015-01-23 NOTE — ED Notes (Signed)
Pt made aware to return if symptoms worsen or if any life threatening symptoms occur.   

## 2015-01-23 NOTE — ED Provider Notes (Signed)
CSN: [id number]     Arrival date & time 01/23/15  0800 History   First MD Initiated Contact with Patient 01/23/15 [phone]     Chief Complaint  Patient presents with  . Suture / Staple Removal     (Consider location/radiation/quality/duration/timing/severity/associated sxs/prior Treatment) Patient is a 54 y.o. male presenting with suture removal. The history is provided by the patient. No language interpreter was used.  Suture / Staple Removal This is a new problem. The current episode started today. The problem occurs constantly. The problem has been gradually worsening. Nothing aggravates the symptoms. He has tried nothing for the symptoms. The treatment provided moderate relief.    Past Medical History  Diagnosis Date  . Hypercholesteremia   . Substance abuse     alcohol, nicotine, h/o coacaine and marijuana  . Hypertension    Past Surgical History  Procedure Laterality Date  . Hernia repair  0569 approx    umbilical hernia  . Elbow fusion  1993    left elbow dislocation  . Elbow fusion Left 1979   Family History  Problem Relation Age of Onset  . Adopted: Yes  . Cancer Mother 34    Bone   . Diabetes Mother   . Hypertension Sister   . Stroke Father 14  . Heart disease Father 52  . Diabetes Father    History  Substance Use Topics  . Smoking status: Current Every Day Smoker -- 0.50 packs/day    Types: Cigarettes  . Smokeless tobacco: Not on file     Comment: reduce by 1 cigarette each week  . Alcohol Use: 1.2 oz/week    2 Cans of beer per week     Comment: everyday    Review of Systems  Skin: Positive for wound.  All other systems reviewed and are negative.     Allergies  Review of patient's allergies indicates no known allergies.  Home Medications   Prior to Admission medications   Medication Sig Start Date End Date Taking? Authorizing Provider  indomethacin (INDOCIN) 50 MG capsule Take 1 capsule (50 mg total) by mouth 2 (two) times daily with a  meal. Patient not taking: Reported on 10/19/2014 09/09/14   Fayrene Helper, MD  predniSONE (STERAPRED UNI-PAK) 5 MG TABS tablet Use as directed Patient not taking: Reported on 10/19/2014 09/09/14   Fayrene Helper, MD  ranitidine (ZANTAC) 150 MG tablet Take 1 tablet (150 mg total) by mouth 2 (two) times daily. Patient not taking: Reported on 10/19/2014 09/09/14   Fayrene Helper, MD   BP 147/88 mmHg  Pulse 59  Temp(Src) 97.6 F (36.4 C) (Oral)  Resp 16  Ht 5\' 11"  (1.803 m)  Wt 165 lb (74.844 kg)  BMI 23.02 kg/m2  SpO2 99% Physical Exam  Constitutional: He appears well-nourished.  HENT:  Head: Normocephalic.  Healed laceration forehead  Neurological: He is alert.  Skin: Skin is warm.  Psychiatric: He has a normal mood and affect.  Nursing note and vitals reviewed.   ED Course  Procedures (including critical care time) Labs Review Labs Reviewed - No data to display  Imaging Review No results found.   EKG Interpretation None      MDM   Final diagnoses:  Visit for suture removal    Return if any problems.    Hollace Kinnier Brightwood, PA-C 01/23/15 7948  Milton Ferguson, MD 01/23/15 [phone]

## 2015-01-23 NOTE — Discharge Instructions (Signed)

## 2015-01-23 NOTE — ED Notes (Signed)
Here for staple removal

## 2015-02-01 ENCOUNTER — Encounter: Payer: Self-pay | Admitting: Family Medicine

## 2015-02-01 ENCOUNTER — Ambulatory Visit (INDEPENDENT_AMBULATORY_CARE_PROVIDER_SITE_OTHER): Payer: Medicare Other | Admitting: Family Medicine

## 2015-02-01 VITALS — BP 128/80 | HR 98 | Resp 18 | Ht 71.0 in | Wt 157.0 lb

## 2015-02-01 DIAGNOSIS — Z720 Tobacco use: Secondary | ICD-10-CM | POA: Diagnosis not present

## 2015-02-01 DIAGNOSIS — Z0000 Encounter for general adult medical examination without abnormal findings: Secondary | ICD-10-CM | POA: Insufficient documentation

## 2015-02-01 DIAGNOSIS — F17210 Nicotine dependence, cigarettes, uncomplicated: Secondary | ICD-10-CM

## 2015-02-01 DIAGNOSIS — Z1211 Encounter for screening for malignant neoplasm of colon: Secondary | ICD-10-CM

## 2015-02-01 DIAGNOSIS — F17200 Nicotine dependence, unspecified, uncomplicated: Secondary | ICD-10-CM

## 2015-02-01 LAB — HEMOCCULT GUIAC POC 1CARD (OFFICE): Fecal Occult Blood, POC: NEGATIVE

## 2015-02-01 NOTE — Patient Instructions (Signed)
F/u in 4.5 month , call if you need me before  Please work on the habits you need to change, you are otherwise doing well  Thanks for choosing [facility], we consider it a privelige to serve you.

## 2015-02-01 NOTE — Assessment & Plan Note (Addendum)
Annual exam as documented. Counseling done  re healthy lifestyle involving commitment to 150 minutes exercise per week, heart healthy diet, and attaining healthy weight.The importance of adequate sleep also discussed. Regular seat belt use and home safety, is also discussed. Changes in health habits are decided on by the patient with goals and time frames  set for achieving them.SApecifc need is to stop drug use, alcohol and , encouraged adattendance wt  Group sessions, refused Immunization and cancer screening needs are specifically addressed at this visit.

## 2015-02-21 NOTE — Progress Notes (Signed)
   Subjective:    Patient ID: Wesley Brennan, male    DOB: May 26, 1961, 54 y.o.   MRN: [id number]  HPI Patient is in for annual physical exam. No other health concerns are expressed or addressed at the visit. Recent labs, if available are reviewed. Immunization is reviewed , and  updated if needed.    Review of Systems See HPI     Objective:   Physical Exam  Pleasant well nourished male, alert and oriented x 3, in no cardio-pulmonary distress. Afebrile. HEENT No facial trauma or asymetry. Sinuses non tender. EOMI,  External ears normal, tympanic membranes clear. Oropharynx moist, no exudate, poor  dentition. Neck: supple, no adenopathy,JVD or thyromegaly.No bruits.  Chest: Clear to ascultation bilaterally.No crackles or wheezes. Non tender to palpation  Breast: No asymetry,no masses. No nipple discharge or inversion. No axillary or supraclavicular adenopathy  Cardiovascular system; Heart sounds normal,  S1 and  S2 ,no S3.  No murmur, or thrill. Apical beat not displaced Peripheral pulses normal.  Abdomen: Soft, non tender, no organomegaly or masses. No bruits. Bowel sounds normal. No guarding, tenderness or rebound.  Rectal:  Normal sphincter tone. No hemorrhoids or  masses. guaiac negative stool. Prostate smooth and firm  GU: Not exa,mined  Musculoskeletal exam: Full ROM of spine, hips ,  and knees.Decreased OM upper extremity with contracture at elbow Neurologic: Cranial nerves 2 to 12 intact. Power, tone ,sensation and reflexes normal throughout. No disturbance in gait. No tremor.  Skin: Intact, no ulceration, erythema , scaling or rash noted. Pigmentation normal throughout  Psych; Normal mood and affect. Judgement and concentration normal        Assessment & Plan:  Annual physical exam Annual exam as documented. Counseling done  re healthy lifestyle involving commitment to 150 minutes exercise per week, heart healthy diet, and attaining  healthy weight.The importance of adequate sleep also discussed. Regular seat belt use and home safety, is also discussed. Changes in health habits are decided on by the patient with goals and time frames  set for achieving them.SApecifc need is to stop drug use, alcohol and , encouraged adattendance wt  Group sessions, refused Immunization and cancer screening needs are specifically addressed at this visit.   NICOTINE ADDICTION Patient counseled for approximately 5 minutes regarding the health risks of ongoing nicotine use, specifically all types of cancer, heart disease, stroke and respiratory failure. The options available for help with cessation ,the behavioral changes to assist the process, and the option to either gradully reduce usage  Or abruptly stop.is also discussed. Pt is also encouraged to set specific goals in number of cigarettes used daily, as well as to set a quit date.  Number of cigarettes/cigars currently smoking daily10

## 2015-02-21 NOTE — Assessment & Plan Note (Signed)

## 2015-04-27 ENCOUNTER — Emergency Department (HOSPITAL_COMMUNITY)
Admission: EM | Admit: 2015-04-27 | Discharge: 2015-04-27 | Disposition: A | Discharge status: Home / Self Care | Payer: Medicare Other | Attending: Emergency Medicine | Admitting: Emergency Medicine

## 2015-04-27 ENCOUNTER — Emergency Department (HOSPITAL_COMMUNITY): Payer: Medicare Other

## 2015-04-27 ENCOUNTER — Encounter (HOSPITAL_COMMUNITY): Payer: Self-pay | Admitting: Emergency Medicine

## 2015-04-27 DIAGNOSIS — Z8659 Personal history of other mental and behavioral disorders: Secondary | ICD-10-CM | POA: Diagnosis not present

## 2015-04-27 DIAGNOSIS — L539 Erythematous condition, unspecified: Secondary | ICD-10-CM | POA: Diagnosis not present

## 2015-04-27 DIAGNOSIS — M25562 Pain in left knee: Secondary | ICD-10-CM | POA: Diagnosis present

## 2015-04-27 DIAGNOSIS — Z8639 Personal history of other endocrine, nutritional and metabolic disease: Secondary | ICD-10-CM | POA: Diagnosis not present

## 2015-04-27 DIAGNOSIS — Z720 Tobacco use: Secondary | ICD-10-CM | POA: Insufficient documentation

## 2015-04-27 DIAGNOSIS — I10 Essential (primary) hypertension: Secondary | ICD-10-CM | POA: Insufficient documentation

## 2015-04-27 MED ORDER — COLCHICINE 0.6 MG PO TABS: 0.6000 mg | ORAL_TABLET | Freq: Two times a day (BID) | ORAL | Status: DC

## 2015-04-27 MED ORDER — IBUPROFEN 800 MG PO TABS: 800.0000 mg | ORAL_TABLET | Freq: Three times a day (TID) | ORAL | Status: DC

## 2015-04-27 NOTE — ED Notes (Signed)
Knee pain for last couple days, rates pain 5/10.  Denies injury.  Took tylenol without relief, last dose was last night.

## 2015-04-27 NOTE — ED Provider Notes (Signed)
CSN: [id number]     Arrival date & time 04/27/15  1038 History   First MD Initiated Contact with Patient 04/27/15 1057     Chief Complaint  Patient presents with  . Knee Pain    left     (Consider location/radiation/quality/duration/timing/severity/associated sxs/prior Treatment) Patient is a 54 y.o. male presenting with knee pain. The history is provided by the patient. No language interpreter was used.  Knee Pain Location:  Knee Injury: no   Knee location:  L knee Pain details:    Quality:  Aching   Radiates to:  Does not radiate   Severity:  Moderate   Onset quality:  Gradual   Timing:  Constant   Progression:  Worsening Chronicity:  New Dislocation: no   Foreign body present:  No foreign bodies Relieved by:  Nothing Worsened by:  Nothing tried Ineffective treatments:  None tried Risk factors: no recent illness     Past Medical History  Diagnosis Date  . Hypercholesteremia   . Substance abuse     alcohol, nicotine, h/o coacaine and marijuana  . Hypertension    Past Surgical History  Procedure Laterality Date  . Hernia repair  4315 approx    umbilical hernia  . Elbow fusion  1993    left elbow dislocation  . Elbow fusion Left 1979   Family History  Problem Relation Age of Onset  . Adopted: Yes  . Cancer Mother 9    Bone   . Diabetes Mother   . Hypertension Sister   . Stroke Father 73  . Heart disease Father 32  . Diabetes Father    Social History  Substance Use Topics  . Smoking status: Current Every Day Smoker -- 0.50 packs/day    Types: Cigarettes  . Smokeless tobacco: None     Comment: reduce by 1 cigarette each week  . Alcohol Use: 1.2 oz/week    2 Cans of beer per week     Comment: everyday    Review of Systems  All other systems reviewed and are negative.     Allergies  Review of patient's allergies indicates no known allergies.  Home Medications   Prior to Admission medications   Not on File   BP 142/66 mmHg  Pulse 89   Temp(Src) 98 F (36.7 C) (Oral)  Resp 18  Ht 5\' 10"  (1.778 m)  Wt 160 lb (72.576 kg)  BMI 22.96 kg/m2  SpO2 100% Physical Exam  Constitutional: He is oriented to person, place, and time. He appears well-developed and well-nourished.  Musculoskeletal: He exhibits tenderness.  Redness right knee, no effusion, nv and ns intact  Neurological: He is alert and oriented to person, place, and time. He has normal reflexes.  Skin: There is erythema.  Psychiatric: He has a normal mood and affect.  Nursing note and vitals reviewed.   ED Course  Procedures (including critical care time) Labs Review Labs Reviewed - No data to display  Imaging Review Dg Knee Complete 4 Views Left  04/27/2015   CLINICAL DATA:  Left knee pain.  No known injury.  EXAM: LEFT KNEE - COMPLETE 4+ VIEW  COMPARISON:  None.  FINDINGS: No evidence of acute fracture or subluxation. No focal bone lesion or bone destruction. Bone cortex and trabecular architecture appear intact. Enthesophytes off of the superior patellar is noted.  IMPRESSION: No acute osseous abnormality identified.   Electronically Signed   By: Fidela Salisbury M.D.   On: 04/27/2015 11:59   I have personally  reviewed and evaluated these images and lab results as part of my medical decision-making.   EKG Interpretation None      MDM   Final diagnoses:  Left knee pain    Meds ordered this encounter  Medications  . colchicine 0.6 MG tablet    Sig: Take 1 tablet (0.6 mg total) by mouth 2 (two) times daily.    Dispense:  20 tablet    Refill:  0    Order Specific Question:  Supervising Provider    Answer:  MILLER, BRIAN [[id number]]  . ibuprofen (ADVIL,MOTRIN) 800 MG tablet    Sig: Take 1 tablet (800 mg total) by mouth 3 (three) times daily.    Dispense:  21 tablet    Refill:  0    Order Specific Question:  Supervising Provider    Answer:  Noemi Chapel [[id number]]      Hollace Kinnier Grawn, PA-C 04/27/15 [place], MD 04/29/15 [phone]

## 2015-04-27 NOTE — ED Notes (Signed)
PA at bedside.

## 2015-04-27 NOTE — Discharge Instructions (Signed)
Gout Gout is an inflammatory arthritis caused by a buildup of uric acid crystals in the joints. Uric acid is a chemical that is normally present in the blood. When the level of uric acid in the blood is too high it can form crystals that deposit in your joints and tissues. This causes joint redness, soreness, and swelling (inflammation). Repeat attacks are common. Over time, uric acid crystals can form into masses (tophi) near a joint, destroying bone and causing disfigurement. Gout is treatable and often preventable. CAUSES  The disease begins with elevated levels of uric acid in the blood. Uric acid is produced by your body when it breaks down a naturally found substance called purines. Certain foods you eat, such as meats and fish, contain high amounts of purines. Causes of an elevated uric acid level include:  Being passed down from parent to child (heredity).  Diseases that cause increased uric acid production (such as obesity, psoriasis, and certain cancers).  Excessive alcohol use.  Diet, especially diets rich in meat and seafood.  Medicines, including certain cancer-fighting medicines (chemotherapy), water pills (diuretics), and aspirin.  Chronic kidney disease. The kidneys are no longer able to remove uric acid well.  Problems with metabolism. Conditions strongly associated with gout include:  Obesity.  High blood pressure.  High cholesterol.  Diabetes. Not everyone with elevated uric acid levels gets gout. It is not understood why some people get gout and others do not. Surgery, joint injury, and eating too much of certain foods are some of the factors that can lead to gout attacks. SYMPTOMS   An attack of gout comes on quickly. It causes intense pain with redness, swelling, and warmth in a joint.  Fever can occur.  Often, only one joint is involved. Certain joints are more commonly involved:  Base of the big toe.  Knee.  Ankle.  Wrist.  Finger. Without  treatment, an attack usually goes away in a few days to weeks. Between attacks, you usually will not have symptoms, which is different from many other forms of arthritis. DIAGNOSIS  Your caregiver will suspect gout based on your symptoms and exam. In some cases, tests may be recommended. The tests may include:  Blood tests.  Urine tests.  X-rays.  Joint fluid exam. This exam requires a needle to remove fluid from the joint (arthrocentesis). Using a microscope, gout is confirmed when uric acid crystals are seen in the joint fluid. TREATMENT  There are two phases to gout treatment: treating the sudden onset (acute) attack and preventing attacks (prophylaxis).  Treatment of an Acute Attack.  Medicines are used. These include anti-inflammatory medicines or steroid medicines.  An injection of steroid medicine into the affected joint is sometimes necessary.  The painful joint is rested. Movement can worsen the arthritis.  You may use warm or cold treatments on painful joints, depending which works best for you.  Treatment to Prevent Attacks.  If you suffer from frequent gout attacks, your caregiver may advise preventive medicine. These medicines are started after the acute attack subsides. These medicines either help your kidneys eliminate uric acid from your body or decrease your uric acid production. You may need to stay on these medicines for a very long time.  The early phase of treatment with preventive medicine can be associated with an increase in acute gout attacks. For this reason, during the first few months of treatment, your caregiver may also advise you to take medicines usually used for acute gout treatment. Be sure you  understand your caregiver's directions. Your caregiver may make several adjustments to your medicine dose before these medicines are effective.  Discuss dietary treatment with your caregiver or dietitian. Alcohol and drinks high in sugar and fructose and foods  such as meat, poultry, and seafood can increase uric acid levels. Your caregiver or dietitian can advise you on drinks and foods that should be limited. HOME CARE INSTRUCTIONS   Do not take aspirin to relieve pain. This raises uric acid levels.  Only take over-the-counter or prescription medicines for pain, discomfort, or fever as directed by your caregiver.  Rest the joint as much as possible. When in bed, keep sheets and blankets off painful areas.  Keep the affected joint raised (elevated).  Apply warm or cold treatments to painful joints. Use of warm or cold treatments depends on which works best for you.  Use crutches if the painful joint is in your leg.  Drink enough fluids to keep your urine clear or pale yellow. This helps your body get rid of uric acid. Limit alcohol, sugary drinks, and fructose drinks.  Follow your dietary instructions. Pay careful attention to the amount of protein you eat. Your daily diet should emphasize fruits, vegetables, whole grains, and fat-free or low-fat milk products. Discuss the use of coffee, vitamin C, and cherries with your caregiver or dietitian. These may be helpful in lowering uric acid levels.  Maintain a healthy body weight. SEEK MEDICAL CARE IF:   You develop diarrhea, vomiting, or any side effects from medicines.  You do not feel better in 24 hours, or you are getting worse. SEEK IMMEDIATE MEDICAL CARE IF:   Your joint becomes suddenly more tender, and you have chills or a fever. MAKE SURE YOU:   Understand these instructions.  Will watch your condition.  Will get help right away if you are not doing well or get worse.   This information is not intended to replace advice given to you by your health care provider. Make sure you discuss any questions you have with your health care provider.   Document Released: 06/30/2000 Document Revised: 07/24/2014 Document Reviewed: 02/14/2012 Elsevier Interactive Patient Education 2016  [employer] Knee Pain Knee pain is a very common symptom and can have many causes. Knee pain often goes away when you follow your health care provider's instructions for relieving pain and discomfort at home. However, knee pain can develop into a condition that needs treatment. Some conditions may include:  Arthritis caused by wear and tear (osteoarthritis).  Arthritis caused by swelling and irritation (rheumatoid arthritis or gout).  A cyst or growth in your knee.  An infection in your knee joint.  An injury that will not heal.  Damage, swelling, or irritation of the tissues that support your knee (torn ligaments or tendinitis). If your knee pain continues, additional tests may be ordered to diagnose your condition. Tests may include X-rays or other imaging studies of your knee. You may also need to have fluid removed from your knee. Treatment for ongoing knee pain depends on the cause, but treatment may include:  Medicines to relieve pain or swelling.  Steroid injections in your knee.  Physical therapy.  Surgery. HOME CARE INSTRUCTIONS  Take medicines only as directed by your health care provider.  Rest your knee and keep it raised (elevated) while you are resting.  Do not do things that cause or worsen pain.  Avoid high-impact activities or exercises, such as running, jumping rope, or doing jumping jacks.  Apply ice  to the knee area:  Put ice in a plastic bag.  Place a towel between your skin and the bag.  Leave the ice on for 20 minutes, 2-3 times a day.  Ask your health care provider if you should wear an elastic knee support.  Keep a pillow under your knee when you sleep.  Lose weight if you are overweight. Extra weight can put pressure on your knee.  Do not use any tobacco products, including cigarettes, chewing tobacco, or electronic cigarettes. If you need help quitting, ask your health care provider. Smoking may slow the healing of any bone and joint  problems that you may have. SEEK MEDICAL CARE IF:  Your knee pain continues, changes, or gets worse.  You have a fever along with knee pain.  Your knee buckles or locks up.  Your knee becomes more swollen. SEEK IMMEDIATE MEDICAL CARE IF:   Your knee joint feels hot to the touch.  You have chest pain or trouble breathing.   This information is not intended to replace advice given to you by your health care provider. Make sure you discuss any questions you have with your health care provider.   Document Released: 04/30/2007 Document Revised: 07/24/2014 Document Reviewed: 02/16/2014 Elsevier Interactive Patient Education [employer].

## 2015-06-17 ENCOUNTER — Encounter: Payer: Self-pay | Admitting: Family Medicine

## 2015-06-17 ENCOUNTER — Ambulatory Visit (INDEPENDENT_AMBULATORY_CARE_PROVIDER_SITE_OTHER): Payer: Medicare Other | Admitting: Family Medicine

## 2015-06-17 VITALS — BP 122/68 | HR 88 | Temp 98.5°F | Resp 14 | Ht 71.0 in | Wt 153.0 lb

## 2015-06-17 DIAGNOSIS — Z23 Encounter for immunization: Secondary | ICD-10-CM | POA: Diagnosis not present

## 2015-06-17 DIAGNOSIS — F1024 Alcohol dependence with alcohol-induced mood disorder: Secondary | ICD-10-CM

## 2015-06-17 DIAGNOSIS — Z1211 Encounter for screening for malignant neoplasm of colon: Secondary | ICD-10-CM

## 2015-06-17 DIAGNOSIS — J3089 Other allergic rhinitis: Secondary | ICD-10-CM

## 2015-06-17 DIAGNOSIS — F17200 Nicotine dependence, unspecified, uncomplicated: Secondary | ICD-10-CM | POA: Diagnosis not present

## 2015-06-17 NOTE — Progress Notes (Signed)
   Subjective:    Patient ID: Wesley Brennan, male    DOB: 1960/10/09, 54 y.o.   MRN: [id number]  HPI    Wesley Brennan     MRN: [id number]      DOB: [birth year]   HPI [name] is here for follow up and re-evaluation of chronic medical conditions, medication management and review of any available recent lab and radiology data.  Preventive health is updated, specifically  Cancer screening and Immunization.   Questions or concerns regarding consultations or procedures which the PT has had in the interim are  addressed. The PT denies any adverse reactions to current medications since the last visit.  There are no new concerns.  There are no specific complaints   ROS Denies recent fever or chills. Denies sinus pressure, nasal congestion, ear pain or sore throat. Denies chest congestion, productive cough or wheezing. Denies chest pains, palpitations and leg swelling Denies abdominal pain, nausea, vomiting,diarrhea or constipation.   Denies dysuria, frequency, hesitancy or incontinence. Denies joint pain, swelling and limitation in mobility. Denies headaches, seizures, numbness, or tingling. Denies depression, anxiety or insomnia. Denies skin break down or rash.   PE  BP 122/68 mmHg  Pulse 88  Temp(Src) 98.5 F (36.9 C)  Ht 5\' 11"  (1.803 m)  Wt 153 lb (69.4 kg)  BMI 21.35 kg/m2  SpO2 96%  Patient alert and oriented and in no cardiopulmonary distress.  HEENT: No facial asymmetry, EOMI,   oropharynx pink and moist.  Neck supple no JVD, no mass.  Chest: Clear to auscultation bilaterally.  CVS: S1, S2 no murmurs, no S3.Regular rate.  ABD: Soft non tender.   Ext: No edema  MS: Adequate ROM spine, shoulders, hips and knees.  Skin: Intact, no ulcerations or rash noted.  Psych: Good eye contact, normal affect. Memory intact not anxious or depressed appearing.  CNS: CN 2-12 intact, power,  normal throughout.no focal deficits noted.   Assessment & Plan  Allergic  rhinitis No current flare, not  on any maintenance medication currently  NICOTINE ADDICTION Patient counseled for approximately 5 minutes regarding the health risks of ongoing nicotine use, specifically all types of cancer, heart disease, stroke and respiratory failure. The options available for help with cessation ,the behavioral changes to assist the process, and the option to either gradully reduce usage  Or abruptly stop.is also discussed. Pt is also encouraged to set specific goals in number of cigarettes used daily, as well as to set a quit date.  Number of cigarettes/cigars currently smoking daily: 10   Alcohol dependence Reports drinking less since recent loss of his mentor, states 40 ounces daily, encouraged him to quit     Review of Systems     Objective:   Physical Exam        Assessment & Plan:

## 2015-06-17 NOTE — Assessment & Plan Note (Signed)
Reports drinking less since recent loss of his mentor, states 40 ounces daily, encouraged him to quit

## 2015-06-17 NOTE — Patient Instructions (Addendum)
F/u early March, please call if you need me before  Flu vaccine today  You are referred to Dr Oneida Alar for screening colonoscopy, due eand January, her office will call you to schedule the appointment  Cut back on cigarettes , with an intent to quit please, also limit alcohol use   You Can Quit Smoking If you are ready to quit smoking or are thinking about it, congratulations! You have chosen to help yourself be healthier and live longer! There are lots of different ways to quit smoking. Nicotine gum, nicotine patches, a nicotine inhaler, or nicotine nasal spray can help with physical craving. Hypnosis, support groups, and medicines help break the habit of smoking. TIPS TO GET OFF AND STAY OFF CIGARETTES  Learn to predict your moods. Do not let a bad situation be your excuse to have a cigarette. Some situations in your life might tempt you to have a cigarette.  Ask friends and co-workers not to smoke around you.  Make your home smoke-free.  Never have "just one" cigarette. It leads to wanting another and another. Remind yourself of your decision to quit.  On a card, make a list of your reasons for not smoking. Read it at least the same number of times a day as you have a cigarette. Tell yourself everyday, "I do not want to smoke. I choose not to smoke."  Ask someone at home or work to help you with your plan to quit smoking.  Have something planned after you eat or have a cup of coffee. Take a walk or get other exercise to perk you up. This will help to keep you from overeating.  Try a relaxation exercise to calm you down and decrease your stress. Remember, you may be tense and nervous the first two weeks after you quit. This will pass.  Find new activities to keep your hands busy. Play with a pen, coin, or rubber band. Doodle or draw things on paper.  Brush your teeth right after eating. This will help cut down the craving for the taste of tobacco after meals. You can try mouthwash  too.  Try gum, breath mints, or diet candy to keep something in your mouth. IF YOU SMOKE AND WANT TO QUIT:  Do not stock up on cigarettes. Never buy a carton. Wait until one pack is finished before you buy another.  Never carry cigarettes with you at work or at home.  Keep cigarettes as far away from you as possible. Leave them with someone else.  Never carry matches or a lighter with you.  Ask yourself, "Do I need this cigarette or is this just a reflex?"  Bet with someone that you can quit. Put cigarette money in a piggy bank every morning. If you smoke, you give up the money. If you do not smoke, by the end of the week, you keep the money.  Keep trying. It takes 21 days to change a habit!  Talk to your doctor about using medicines to help you quit. These include nicotine replacement gum, lozenges, or skin patches.   This information is not intended to replace advice given to you by your health care provider. Make sure you discuss any questions you have with your health care provider.   Document Released: 04/29/2009 Document Revised: 09/25/2011 Document Reviewed: 04/29/2009 Elsevier Interactive Patient Education [employer].

## 2015-06-17 NOTE — Assessment & Plan Note (Signed)
No current flare, not  on any maintenance medication currently

## 2015-06-17 NOTE — Assessment & Plan Note (Signed)

## 2015-06-21 ENCOUNTER — Telehealth: Payer: Self-pay

## 2015-06-21 NOTE — Telephone Encounter (Signed)
Pt called this morning saying he had received a letter from DS. Please call him at [phone]

## 2015-06-21 NOTE — Telephone Encounter (Signed)
LMOM to that I will be leaving at 3:00 pm today and I will try to call him back tomorrow afternoon.

## 2015-06-22 ENCOUNTER — Telehealth: Payer: Self-pay

## 2015-06-22 NOTE — Telephone Encounter (Signed)
PT called and I have triaged. Faxed info to Medical Records for copy of last colonoscopy report. Pt said his last one was some years ago by Dr. Tamala Julian and he could not remember the results or he was to repeat.

## 2015-06-29 ENCOUNTER — Other Ambulatory Visit: Payer: Self-pay

## 2015-06-29 DIAGNOSIS — Z1211 Encounter for screening for malignant neoplasm of colon: Secondary | ICD-10-CM

## 2015-06-29 MED ORDER — NA SULFATE-K SULFATE-MG SULF 17.5-3.13-1.6 GM/177ML PO SOLN: 1.0000 | ORAL | Status: DC

## 2015-06-29 NOTE — Telephone Encounter (Signed)
SUPREP SPLIT DOSING- FULL LIQUIDS WITH BREAKFAST.  Full Liquid Diet A high-calorie, high-protein supplement should be used to meet your nutritional requirements when the full liquid diet is continued for more than 2 or 3 days. If this diet is to be used for an extended period of time (more than 7 days), a multivitamin should be considered.  Breads and Starches  Allowed: None are allowed except crackers, pureed (made into a thick, smooth soup) in soup.   Avoid: Any others.    Potatoes/Pasta/Rice  Allowed: ANY ITEM AS A SOUP OR SMALL PLATE OF MASHED POTATOES OR SCRAMBLED EGGS.       Vegetables  Allowed: Strained tomato or vegetable juice. Vegetables pureed in soup.   Avoid: Any others.    Fruit  Allowed: Any strained fruit juices and fruit drinks. Include 1 serving of citrus or vitamin C-enriched fruit juice daily.   Avoid: Any others.  Meat and Meat Substitutes  Allowed: Egg  Avoid: Any meat, fish, or fowl. All cheese.  Milk  Allowed: SOY Milk beverages, including milk shakes and instant breakfast mixes. Smooth yogurt.   Avoid: Any others. Avoid dairy products if not tolerated.    Soups and Combination Foods  Allowed: Broth, strained cream soups. Strained, broth-based soups.   Avoid: Any others.    Desserts and Sweets  Allowed: flavored gelatin, tapioca, ice cream, sherbet, smooth pudding, junket, fruit ices, frozen ice pops, pudding pops, frozen fudge pops, chocolate syrup. Sugar, honey, jelly, syrup.   Avoid: Any others.  Fats and Oils  Allowed: Margarine, butter, cream, sour cream, oils.   Avoid: Any others.  Beverages  Allowed: All.   Avoid: None.  Condiments  Allowed: Iodized salt, pepper, spices, flavorings. Cocoa powder.   Avoid: Any others.    SAMPLE MEAL PLAN Breakfast   cup orange juice.   1 OR 2 EGGS  1 cup milk.   1 cup beverage (coffee or tea).   Cream or sugar, if desired.    Midmorning Snack  2 SCRAMBLED OR HARD  BOILED EGG   Lunch  1 cup cream soup.    cup fruit juice.   1 cup milk.    cup custard.   1 cup beverage (coffee or tea).   Cream or sugar, if desired.    Midafternoon Snack  1 cup milk shake.  Dinner  1 cup cream soup.    cup fruit juice.   1 cup MILK    cup pudding.   1 cup beverage (coffee or tea).   Cream or sugar, if desired.  Evening Snack  1 cup supplement.  To increase calories, add sugar, cream, butter, or margarine if possible. Nutritional supplements will also increase the total calories.

## 2015-06-29 NOTE — Telephone Encounter (Signed)
See separate triage.  

## 2015-06-29 NOTE — Telephone Encounter (Signed)
Gastroenterology Pre-Procedure Review  Request Date:  06/22/2015 Requesting Physician: Dr. Moshe Cipro  PATIENT REVIEW QUESTIONS: The patient responded to the following health history questions as indicated:    Pt's last colonoscopy was 08/10/2005 by Dr. Irving Shows  1. Diabetes Melitis: no 2. Joint replacements in the past 12 months: no 3. Major health problems in the past 3 months: no 4. Has an artificial valve or MVP: no 5. Has a defibrillator: no 6. Has been advised in past to take antibiotics in advance of a procedure like teeth cleaning: no 7. Family history of colon cancer: no  8. Alcohol Use: YES    Drinks about 40 oz of beer on the weekends 9. History of sleep apnea: no     MEDICATIONS & ALLERGIES:    Patient reports the following regarding taking any blood thinners:   Plavix? no Aspirin? no Coumadin? no  Patient confirms/reports the following medications:  Current Outpatient Prescriptions  Medication Sig Dispense Refill  . ibuprofen (ADVIL,MOTRIN) 800 MG tablet Take 1 tablet (800 mg total) by mouth 3 (three) times daily. 21 tablet 0  . colchicine 0.6 MG tablet Take 1 tablet (0.6 mg total) by mouth 2 (two) times daily. (Patient not taking: Reported on 06/17/2015) 20 tablet 0   No current facility-administered medications for this visit.    Patient confirms/reports the following allergies:  No Known Allergies  No orders of the defined types were placed in this encounter.    AUTHORIZATION INFORMATION Primary Insurance:   ID #:   Group #:  Pre-Cert / Auth required:  Pre-Cert / Auth #:   Secondary Insurance:   ID #:   Group #:  Pre-Cert / Auth required: Pre-Cert / Auth #:   SCHEDULE INFORMATION: Procedure has been scheduled as follows:  Date:                   Time:  Place:   This Gastroenterology Pre-Precedure Review Form is being routed to the following provider(s): Barney Drain, MD

## 2015-06-29 NOTE — Telephone Encounter (Signed)
Pt called back and said his sister can take him and stay with him and take him home. Scheduled for 07/02/2015 at 1:00 pm and knows to be at the hospital at 12:00 noon.  Rx sent to the pharmacy and instructions faxed to pharmacy. PT is aware that he can pick up tomorrow.

## 2015-06-29 NOTE — Telephone Encounter (Signed)
PT called back. I was holding a slot on 07/02/2015 but he has to check and see if someone can come with him.  He is to call me back before 5:00 pm today.

## 2015-06-29 NOTE — Telephone Encounter (Signed)
LMOM to call and schedule.

## 2015-06-30 NOTE — Telephone Encounter (Signed)
PT brought his prep by the office. [facility] had not given him the instructions. I printed and went over all of the instructions with pt and he expressed understanding.

## 2015-07-02 ENCOUNTER — Encounter (HOSPITAL_COMMUNITY)
Admission: RE | Disposition: A | Discharge status: Home / Self Care | Payer: Self-pay | Source: Ambulatory Visit | Attending: Gastroenterology

## 2015-07-02 ENCOUNTER — Encounter (HOSPITAL_COMMUNITY): Payer: Self-pay | Admitting: *Deleted

## 2015-07-02 ENCOUNTER — Ambulatory Visit (HOSPITAL_COMMUNITY)
Admission: RE | Admit: 2015-07-02 | Discharge: 2015-07-02 | Disposition: A | Discharge status: Home / Self Care | Payer: Medicare Other | Source: Ambulatory Visit | Attending: Gastroenterology | Admitting: Gastroenterology

## 2015-07-02 DIAGNOSIS — Z8249 Family history of ischemic heart disease and other diseases of the circulatory system: Secondary | ICD-10-CM | POA: Insufficient documentation

## 2015-07-02 DIAGNOSIS — Z1211 Encounter for screening for malignant neoplasm of colon: Secondary | ICD-10-CM | POA: Diagnosis not present

## 2015-07-02 DIAGNOSIS — Z808 Family history of malignant neoplasm of other organs or systems: Secondary | ICD-10-CM | POA: Insufficient documentation

## 2015-07-02 DIAGNOSIS — Z833 Family history of diabetes mellitus: Secondary | ICD-10-CM | POA: Diagnosis not present

## 2015-07-02 DIAGNOSIS — D125 Benign neoplasm of sigmoid colon: Secondary | ICD-10-CM | POA: Insufficient documentation

## 2015-07-02 DIAGNOSIS — Z79899 Other long term (current) drug therapy: Secondary | ICD-10-CM | POA: Diagnosis not present

## 2015-07-02 DIAGNOSIS — I10 Essential (primary) hypertension: Secondary | ICD-10-CM | POA: Insufficient documentation

## 2015-07-02 DIAGNOSIS — F17210 Nicotine dependence, cigarettes, uncomplicated: Secondary | ICD-10-CM | POA: Insufficient documentation

## 2015-07-02 DIAGNOSIS — E7800 Pure hypercholesterolemia, unspecified: Secondary | ICD-10-CM | POA: Insufficient documentation

## 2015-07-02 DIAGNOSIS — Q438 Other specified congenital malformations of intestine: Secondary | ICD-10-CM | POA: Insufficient documentation

## 2015-07-02 DIAGNOSIS — D124 Benign neoplasm of descending colon: Secondary | ICD-10-CM | POA: Insufficient documentation

## 2015-07-02 DIAGNOSIS — Z823 Family history of stroke: Secondary | ICD-10-CM | POA: Diagnosis not present

## 2015-07-02 HISTORY — PX: COLONOSCOPY: SHX5424

## 2015-07-02 SURGERY — COLONOSCOPY
Anesthesia: Moderate Sedation

## 2015-07-02 MED ORDER — SODIUM CHLORIDE 0.9 % IV SOLN
INTRAVENOUS | Status: DC
Start: 2015-07-02 — End: 2015-07-02
  Administered 2015-07-02: 12:00:00 via INTRAVENOUS

## 2015-07-02 MED ORDER — MIDAZOLAM HCL 5 MG/5ML IJ SOLN
INTRAMUSCULAR | Status: AC
  Filled 2015-07-02: qty 10

## 2015-07-02 MED ORDER — MEPERIDINE HCL 100 MG/ML IJ SOLN
INTRAMUSCULAR | Status: DC | PRN
  Administered 2015-07-02: 25 mg via INTRAVENOUS
  Administered 2015-07-02: 50 mg via INTRAVENOUS
  Administered 2015-07-02: 25 mg via INTRAVENOUS

## 2015-07-02 MED ORDER — MEPERIDINE HCL 100 MG/ML IJ SOLN
INTRAMUSCULAR | Status: AC
  Filled 2015-07-02: qty 2

## 2015-07-02 MED ORDER — STERILE WATER FOR IRRIGATION IR SOLN
Status: DC | PRN
  Administered 2015-07-02: 13:00:00

## 2015-07-02 MED ORDER — MIDAZOLAM HCL 5 MG/5ML IJ SOLN
INTRAMUSCULAR | Status: DC | PRN
  Administered 2015-07-02: 2 mg via INTRAVENOUS
  Administered 2015-07-02: 1 mg via INTRAVENOUS
  Administered 2015-07-02: 2 mg via INTRAVENOUS

## 2015-07-02 NOTE — H&P (Signed)
  Primary Care Physician:  Tula Nakayama, MD Primary Gastroenterologist:  Dr. Oneida Alar  Pre-Procedure History & Physical: HPI:  Wesley Brennan is a 54 y.o. male here for COLON CANCER SCREENING.  Past Medical History  Diagnosis Date  . Hypercholesteremia   . Substance abuse     alcohol, nicotine, h/o coacaine and marijuana  . Hypertension     Past Surgical History  Procedure Laterality Date  . Hernia repair  1610 approx    umbilical hernia  . Elbow fusion  1993    left elbow dislocation  . Elbow fusion Left 1979    Prior to Admission medications   Medication Sig Start Date End Date Taking? Authorizing Provider  ibuprofen (ADVIL,MOTRIN) 800 MG tablet Take 1 tablet (800 mg total) by mouth 3 (three) times daily. 04/27/15  Yes Hollace Kinnier Sofia, PA-C  Na Sulfate-K Sulfate-Mg Sulf (SUPREP BOWEL PREP) SOLN Take 1 kit by mouth as directed. 06/29/15  Yes Danie Binder, MD  colchicine 0.6 MG tablet Take 1 tablet (0.6 mg total) by mouth 2 (two) times daily. Patient not taking: Reported on 06/17/2015 04/27/15   Fransico Meadow, PA-C    Allergies as of 06/29/2015  . (No Known Allergies)    Family History  Problem Relation Age of Onset  . Adopted: Yes  . Cancer Mother 2    Bone   . Diabetes Mother   . Hypertension Sister   . Stroke Father 37  . Heart disease Father 52  . Diabetes Father   . Colon cancer Neg Hx     Social History   Social History  . Marital Status: Single    Spouse Name: N/A  . Number of Children: N/A  . Years of Education: N/A   Occupational History  . Not on file.   Social History Main Topics  . Smoking status: Current Every Day Smoker -- 0.50 packs/day    Types: Cigarettes  . Smokeless tobacco: Not on file     Comment: reduce by 1 cigarette each week  . Alcohol Use: 1.2 oz/week    2 Cans of beer per week     Comment: 40 oz everyday  . Drug Use: No  . Sexual Activity: Yes    Birth Control/ Protection: Condom   Other Topics Concern  . Not on  file   Social History Narrative    Review of Systems: See HPI, otherwise negative ROS  Physical Exam: BP 141/85 mmHg  Pulse 61  Temp(Src) 97.8 F (36.6 C) (Oral)  Resp 11  Ht '5\' 11"'$  (1.803 m)  Wt 159 lb (72.122 kg)  BMI 22.19 kg/m2  SpO2 100% General:   Alert,  pleasant and cooperative in NAD Head:  Normocephalic and atraumatic. Neck:  Supple; Lungs:  Clear throughout to auscultation.    Heart:  Regular rate and rhythm. Abdomen:  Soft, nontender and nondistended. Normal bowel sounds, without guarding, and without rebound.   Neurologic:  Alert and  oriented x4;  grossly normal neurologically.  Impression/Plan:    SCREENING  Plan:  1. TCS TODAY

## 2015-07-02 NOTE — Op Note (Signed)
[facility] [address] [place], [address]   COLONOSCOPY PROCEDURE REPORT  PATIENT: Wesley Brennan, Wesley Brennan  MR#: [id number] BIRTHDATE: 09-27-1960 , 54  yrs. old GENDER: male ENDOSCOPIST: Danie Binder, MD REFERRED IO:[id number] Moshe Cipro, M.D. PROCEDURE DATE:  07-06-2015 PROCEDURE:   Colonoscopy with cold biopsy polypectomy INDICATIONS:average risk patient for colon cancer. MEDICATIONS: Demerol 100 mg IV and Versed 5 mg IV  DESCRIPTION OF PROCEDURE:    Physical exam was performed.  Informed consent was obtained from the patient after explaining the benefits, risks, and alternatives to procedure.  The patient was connected to monitor and placed in left lateral position. Continuous oxygen was provided by nasal cannula and IV medicine administered through an indwelling cannula.  After administration of sedation and rectal exam, the patients rectum was intubated and the     colonoscope was advanced under direct visualization to the cecum.  The scope was removed slowly by carefully examining the color, texture, anatomy, and integrity mucosa on the way out.  The patient was recovered in endoscopy and discharged home in satisfactory condition. Estimated blood loss is zero unless otherwise noted in this procedure report.  NO IMAGES AVAILABE DUE TO EQUIPMENT MALFUNCTION     COLON FINDINGS: The colon was redundant.  Manual abdominal counter-pressure was used to reach the cecum.  The patient was moved on to their back to reach the cecum and Four sessile polyps ranging from 3 to 53mm in size were found in the sigmoid colon and descending colon.  A polypectomy was performed with cold forceps.   PREP QUALITY: good. CECAL W/D TIME: 10       minutes  COMPLICATIONS: None  ENDOSCOPIC IMPRESSION: 1.   The colon was redundant 2.   Four sessile polyps ranging from 3 to 66mm in size were found in the sigmoid colon and descending colon; polypectomy was performed with cold  forceps  RECOMMENDATIONS: HIGH FIBER DIET AWAIT BIOPSY NEXT TCS IN 5-10 YEARS      _______________________________ eSignedDanie Binder, MD July 06, 2015 2:12 PM    CPT CODES: ICD CODES:  The ICD and CPT codes recommended by this software are interpretations from the data that the clinical staff has captured with the software.  The verification of the translation of this report to the ICD and CPT codes and modifiers is the sole responsibility of the health care institution and practicing physician where this report was generated.  [employer] will not be held responsible for the validity of the ICD and CPT codes included on this report.  AMA assumes no liability for data contained or not contained herein. CPT is a Designer, television/film set of the [employer].

## 2015-07-05 ENCOUNTER — Telehealth: Payer: Self-pay | Admitting: Family Medicine

## 2015-07-05 NOTE — Telephone Encounter (Signed)
Wesley Brennan is asking if we have heard from his colonoscopy report, please advise?

## 2015-07-05 NOTE — Telephone Encounter (Signed)
Results not in. Ordering provider should be calling when they are available, called patient. No answer

## 2015-07-08 ENCOUNTER — Encounter: Payer: Self-pay | Admitting: Family Medicine

## 2015-07-08 DIAGNOSIS — D126 Benign neoplasm of colon, unspecified: Secondary | ICD-10-CM | POA: Insufficient documentation

## 2015-07-13 ENCOUNTER — Encounter (HOSPITAL_COMMUNITY): Payer: Self-pay | Admitting: Gastroenterology

## 2015-07-20 ENCOUNTER — Telehealth: Payer: Self-pay | Admitting: Gastroenterology

## 2015-07-20 NOTE — Telephone Encounter (Signed)
Please call pt. HE had four simple adenomas removed.  FOLLOW A HIGH FIBER DIET. AVOID ITEMS THAT CAUSE BLOATING.   Next colonoscopy in  3 years.

## 2015-07-20 NOTE — Discharge Instructions (Signed)
You had 4 small polyps removed.    FOLLOW A HIGH FIBER DIET. AVOID ITEMS THAT CAUSE BLOATING. SEE INFO BELOW.  YOUR BIOPSY RESULTS WILL BE AVAILABLE IN MY CHART TUES DEC 20 AND MY OFFICE WILL CONTACT YOU IN 10-14 DAYS WITH YOUR RESULTS.   Next colonoscopy in 5-10 years.  Colonoscopy Care After Read the instructions outlined below and refer to this sheet in the next week. These discharge instructions provide you with general information on caring for yourself after you leave the hospital. While your treatment has been planned according to the most current medical practices available, unavoidable complications occasionally occur. If you have any problems or questions after discharge, call DR. Chellsie Gomer, [phone].  ACTIVITY  You may resume your regular activity, but move at a slower pace for the next 24 hours.   Take frequent rest periods for the next 24 hours.   Walking will help get rid of the air and reduce the bloated feeling in your belly (abdomen).   No driving for 24 hours (because of the medicine (anesthesia) used during the test).   You may shower.   Do not sign any important legal documents or operate any machinery for 24 hours (because of the anesthesia used during the test).    NUTRITION  Drink plenty of fluids.   You may resume your normal diet as instructed by your doctor.   Begin with a light meal and progress to your normal diet. Heavy or fried foods are harder to digest and may make you feel sick to your stomach (nauseated).   Avoid alcoholic beverages for 24 hours or as instructed.    MEDICATIONS  You may resume your normal medications.   WHAT YOU CAN EXPECT TODAY  Some feelings of bloating in the abdomen.   Passage of more gas than usual.   Spotting of blood in your stool or on the toilet paper  .  IF YOU HAD POLYPS REMOVED DURING THE COLONOSCOPY:  Eat a soft diet IF YOU HAVE NAUSEA, BLOATING, ABDOMINAL PAIN, OR VOMITING.    FINDING OUT THE  RESULTS OF YOUR TEST Not all test results are available during your visit. DR. Oneida Alar WILL CALL YOU WITHIN 14 DAYS OF YOUR PROCEDUE WITH YOUR RESULTS. Do not assume everything is normal if you have not heard from DR. Lavita Pontius, CALL HER OFFICE AT [phone].  SEEK IMMEDIATE MEDICAL ATTENTION AND CALL THE OFFICE: [phone] IF:  You have more than a spotting of blood in your stool.   Your belly is swollen (abdominal distention).   You are nauseated or vomiting.   You have a temperature over 101F.   You have abdominal pain or discomfort that is severe or gets worse throughout the day.   High-Fiber Diet A high-fiber diet changes your normal diet to include more whole grains, legumes, fruits, and vegetables. Changes in the diet involve replacing refined carbohydrates with unrefined foods. The calorie level of the diet is essentially unchanged. The Dietary Reference Intake (recommended amount) for adult males is 38 grams per day. For adult females, it is 25 grams per day. Pregnant and lactating women should consume 28 grams of fiber per day. Fiber is the intact part of a plant that is not broken down during digestion. Functional fiber is fiber that has been isolated from the plant to provide a beneficial effect in the body. PURPOSE  Increase stool bulk.   Ease and regulate bowel movements.   Lower cholesterol.  REDUCE RISK OF COLON CANCER  INDICATIONS  THAT YOU NEED MORE FIBER  Constipation and hemorrhoids.   Uncomplicated diverticulosis (intestine condition) and irritable bowel syndrome.   Weight management.   As a protective measure against hardening of the arteries (atherosclerosis), diabetes, and cancer.   GUIDELINES FOR INCREASING FIBER IN THE DIET  Start adding fiber to the diet slowly. A gradual increase of about 5 more grams (2 slices of whole-wheat bread, 2 servings of most fruits or vegetables, or 1 bowl of high-fiber cereal) per day is best. Too rapid an increase in  fiber may result in constipation, flatulence, and bloating.   Drink enough water and fluids to keep your urine clear or pale yellow. Water, juice, or caffeine-free drinks are recommended. Not drinking enough fluid may cause constipation.   Eat a variety of high-fiber foods rather than one type of fiber.   Try to increase your intake of fiber through using high-fiber foods rather than fiber pills or supplements that contain small amounts of fiber.   The goal is to change the types of food eaten. Do not supplement your present diet with high-fiber foods, but replace foods in your present diet.   INCLUDE A VARIETY OF FIBER SOURCES  Replace refined and processed grains with whole grains, canned fruits with fresh fruits, and incorporate other fiber sources. White rice, white breads, and most bakery goods contain little or no fiber.   Brown whole-grain rice, buckwheat oats, and many fruits and vegetables are all good sources of fiber. These include: broccoli, Brussels sprouts, cabbage, cauliflower, beets, sweet potatoes, white potatoes (skin on), carrots, tomatoes, eggplant, squash, berries, fresh fruits, and dried fruits.   Cereals appear to be the richest source of fiber. Cereal fiber is found in whole grains and bran. Bran is the fiber-rich outer coat of cereal grain, which is largely removed in refining. In whole-grain cereals, the bran remains. In breakfast cereals, the largest amount of fiber is found in those with "bran" in their names. The fiber content is sometimes indicated on the label.   You may need to include additional fruits and vegetables each day.   In baking, for 1 cup white flour, you may use the following substitutions:   1 cup whole-wheat flour minus 2 tablespoons.   1/2 cup white flour plus 1/2 cup whole-wheat flour.   Polyps, Colon  A polyp is extra tissue that grows inside your body. Colon polyps grow in the large intestine. The large intestine, also called the colon,  is part of your digestive system. It is a long, hollow tube at the end of your digestive tract where your body makes and stores stool. Most polyps are not dangerous. They are benign. This means they are not cancerous. But over time, some types of polyps can turn into cancer. Polyps that are smaller than a pea are usually not harmful. But larger polyps could someday become or may already be cancerous. To be safe, doctors remove all polyps and test them.   WHO GETS POLYPS? Anyone can get polyps, but certain people are more likely than others. You may have a greater chance of getting polyps if:  You are over 50.   You have had polyps before.   Someone in your family has had polyps.   Someone in your family has had cancer of the large intestine.   Find out if someone in your family has had polyps. You may also be more likely to get polyps if you:   Eat a lot of fatty foods   Smoke  Drink alcohol   Do not exercise  Eat too much    PREVENTION There is not one sure way to prevent polyps. You might be able to lower your risk of getting them if you:  Eat more fruits and vegetables and less fatty food.   Do not smoke.   Avoid alcohol.   Exercise every day.   Lose weight if you are overweight.   Eating more calcium and folate can also lower your risk of getting polyps. Some foods that are rich in calcium are milk, cheese, and broccoli. Some foods that are rich in folate are chickpeas, kidney beans, and spinach.

## 2015-07-21 NOTE — Telephone Encounter (Signed)
Pt is aware of results. 

## 2015-07-21 NOTE — Telephone Encounter (Signed)
Reminder in epic °

## 2015-09-16 ENCOUNTER — Ambulatory Visit (INDEPENDENT_AMBULATORY_CARE_PROVIDER_SITE_OTHER): Payer: Medicare Other | Admitting: Family Medicine

## 2015-09-16 ENCOUNTER — Encounter: Payer: Self-pay | Admitting: Family Medicine

## 2015-09-16 VITALS — BP 122/78 | HR 89 | Resp 16 | Ht 71.0 in | Wt 161.0 lb

## 2015-09-16 DIAGNOSIS — Z125 Encounter for screening for malignant neoplasm of prostate: Secondary | ICD-10-CM | POA: Diagnosis not present

## 2015-09-16 DIAGNOSIS — F79 Unspecified intellectual disabilities: Secondary | ICD-10-CM | POA: Diagnosis not present

## 2015-09-16 DIAGNOSIS — E559 Vitamin D deficiency, unspecified: Secondary | ICD-10-CM

## 2015-09-16 DIAGNOSIS — Z0000 Encounter for general adult medical examination without abnormal findings: Secondary | ICD-10-CM

## 2015-09-16 DIAGNOSIS — F17200 Nicotine dependence, unspecified, uncomplicated: Secondary | ICD-10-CM

## 2015-09-16 DIAGNOSIS — F1024 Alcohol dependence with alcohol-induced mood disorder: Secondary | ICD-10-CM

## 2015-09-16 DIAGNOSIS — E785 Hyperlipidemia, unspecified: Secondary | ICD-10-CM | POA: Diagnosis not present

## 2015-09-16 NOTE — Assessment & Plan Note (Signed)

## 2015-09-16 NOTE — Assessment & Plan Note (Signed)

## 2015-09-16 NOTE — Patient Instructions (Addendum)
Annual physical exam mid August, call if you need me sooner  Fasting lipid, cmp, CBC, PSA, TSH and Vit D  START aspirin 81 mg every day to lower risk of heart attack  You Can Quit Smoking If you are ready to quit smoking or are thinking about it, congratulations! You have chosen to help yourself be healthier and live longer! There are lots of different ways to quit smoking. Nicotine gum, nicotine patches, a nicotine inhaler, or nicotine nasal spray can help with physical craving. Hypnosis, support groups, and medicines help break the habit of smoking. TIPS TO GET OFF AND STAY OFF CIGARETTES  Learn to predict your moods. Do not let a bad situation be your excuse to have a cigarette. Some situations in your life might tempt you to have a cigarette.  Ask friends and co-workers not to smoke around you.  Make your home smoke-free.  Never have "just one" cigarette. It leads to wanting another and another. Remind yourself of your decision to quit.  On a card, make a list of your reasons for not smoking. Read it at least the same number of times a day as you have a cigarette. Tell yourself everyday, "I do not want to smoke. I choose not to smoke."  Ask someone at home or work to help you with your plan to quit smoking.  Have something planned after you eat or have a cup of coffee. Take a walk or get other exercise to perk you up. This will help to keep you from overeating.  Try a relaxation exercise to calm you down and decrease your stress. Remember, you may be tense and nervous the first two weeks after you quit. This will pass.  Find new activities to keep your hands busy. Play with a pen, coin, or rubber band. Doodle or draw things on paper.  Brush your teeth right after eating. This will help cut down the craving for the taste of tobacco after meals. You can try mouthwash too.  Try gum, breath mints, or diet candy to keep something in your mouth. IF YOU SMOKE AND WANT TO QUIT:  Do not  stock up on cigarettes. Never buy a carton. Wait until one pack is finished before you buy another.  Never carry cigarettes with you at work or at home.  Keep cigarettes as far away from you as possible. Leave them with someone else.  Never carry matches or a lighter with you.  Ask yourself, "Do I need this cigarette or is this just a reflex?"  Bet with someone that you can quit. Put cigarette money in a piggy bank every morning. If you smoke, you give up the money. If you do not smoke, by the end of the week, you keep the money.  Keep trying. It takes 21 days to change a habit!  Talk to your doctor about using medicines to help you quit. These include nicotine replacement gum, lozenges, or skin patches.   This information is not intended to replace advice given to you by your health care provider. Make sure you discuss any questions you have with your health care provider.   Document Released: 04/29/2009 Document Revised: 09/25/2011 Document Reviewed: 04/29/2009 Elsevier Interactive Patient Education [employer].

## 2015-09-16 NOTE — Progress Notes (Signed)
Subjective:    Patient ID: Wesley Brennan, male    DOB: September 01, 1960, 55 y.o.   MRN: [id number]  HPI Preventive Screening-Counseling & Management   Patient present here today for a Medicare annual wellness visit.   Current Problems (verified)   Medications Prior to Visit Allergies (verified)   PAST HISTORY  Family History  Social History Lives alone, no children, no current relation, cigarette, alcohol 1 beer last , drinks 40 ounces per week, no street drug use   Risk Factors  Current exercise habits:    Dietary issues discussed:Heart healthy diet   Cardiac risk factors:   Depression Screen  (Note: if answer to either of the following is "Yes", a more complete depression screening is indicated)   Over the past two weeks, have you felt down, depressed or hopeless? No  Over the past two weeks, have you felt little interest or pleasure in doing things? No  Have you lost interest or pleasure in daily life? No  Do you often feel hopeless? No  Do you cry easily over simple problems? No   Activities of Daily Living  In your present state of health, do you have any difficulty performing the following activities?  Driving?: does not drive h/o alcohol use Managing money?: yes, sister handles his bill payments, she is his payee and has HPOA Feeding yourself?:No Getting from bed to chair?:No Climbing a flight of stairs?:No Preparing food and eating?:No Bathing or showering?:No Getting dressed?:No Getting to the toilet?:No Using the toilet?:No Moving around from place to place?: No  Fall Risk Assessment In the past year have you fallen or had a near fall?:No Are you currently taking any medications that make you dizzy?:No   Hearing Difficulties: No Do you often ask people to speak up or repeat themselves?:No Do you experience ringing or noises in your ears?:No Do you have difficulty understanding soft or whispered voices?:No  Cognitive Testing  Alert? Yes Normal  Appearance?Yes  Oriented to person? Yes Place? Yes  Time? Yes  Displays appropriate judgment?Yes  Can read the correct time from a watch face? yes Are you having problems remembering things?No  Advanced Directives have been discussed with the patient?Yes    List the Names of Other Physician/Practitioners you currently use:    Indicate any recent Medical Services you may have received from other than [facility] providers in the past year (date may be approximate).   Assessment:    Annual Wellness Exam   Plan:      Medicare Attestation  I have personally reviewed:  The patient's medical and social history  Their use of alcohol, tobacco or illicit drugs  Their current medications and supplements  The patient's functional ability including ADLs,fall risks, home safety risks, cognitive, and hearing and visual impairment  Diet and physical activities  Evidence for depression or mood disorders  The patient's weight, height, BMI, and visual acuity have been recorded in the chart. I have made referrals, counseling, and provided education to the patient based on review of the above and I have provided the patient with a written personalized care plan for preventive services.      Review of Systems     Objective:   Physical Exam  BP 122/78 mmHg  Pulse 89  Resp 16  Ht 5\' 11"  (1.803 m)  Wt 161 lb (73.029 kg)  BMI 22.46 kg/m2  SpO2 97%       Assessment & Plan:  Medicare annual wellness visit, subsequent Annual exam as documented.  Counseling done  re healthy lifestyle involving commitment to 150 minutes exercise per week, heart healthy diet, and attaining healthy weight.The importance of adequate sleep also discussed. Regular seat belt use and home safety, is also discussed. Changes in health habits are decided on by the patient with goals and time frames  set for achieving them. Immunization and cancer screening needs are specifically addressed at this visit.   NICOTINE  ADDICTION Patient counseled for approximately 5 minutes regarding the health risks of ongoing nicotine use, specifically all types of cancer, heart disease, stroke and respiratory failure. The options available for help with cessation ,the behavioral changes to assist the process, and the option to either gradully reduce usage  Or abruptly stop.is also discussed. Pt is also encouraged to set specific goals in number of cigarettes used daily, as well as to set a quit date.  Number of cigarettes/cigars currently smoking daily: 1

## 2016-02-29 ENCOUNTER — Other Ambulatory Visit: Payer: Self-pay | Admitting: Family Medicine

## 2016-02-29 LAB — CBC
HCT: 38.6 % (ref 38.5–50.0)
HEMOGLOBIN: 12.9 g/dL — AB (ref 13.2–17.1)
MCH: 32.5 pg (ref 27.0–33.0)
MCHC: 33.4 g/dL (ref 32.0–36.0)
MCV: 97.2 fL (ref 80.0–100.0)
MPV: 9.5 fL (ref 7.5–12.5)
Platelets: 200 10*3/uL (ref 140–400)
RBC: 3.97 MIL/uL — AB (ref 4.20–5.80)
RDW: 12.9 % (ref 11.0–15.0)
WBC: 6.2 10*3/uL (ref 3.8–10.8)

## 2016-03-01 LAB — IRON,TIBC AND FERRITIN PANEL
%SAT: 28 % (ref 15–60)
Ferritin: 410 ng/mL — ABNORMAL HIGH (ref 20–380)
Iron: 75 ug/dL (ref 50–180)
TIBC: 270 ug/dL (ref 250–425)

## 2016-03-01 LAB — PSA: PSA: 1.4 ng/mL (ref <=4.0)

## 2016-03-01 LAB — COMPREHENSIVE METABOLIC PANEL
ALBUMIN: 4.2 g/dL (ref 3.6–5.1)
ALT: 18 U/L (ref 9–46)
AST: 25 U/L (ref 10–35)
Alkaline Phosphatase: 59 U/L (ref 40–115)
BUN: 22 mg/dL (ref 7–25)
CO2: 23 mmol/L (ref 20–31)
Calcium: 9.5 mg/dL (ref 8.6–10.3)
Chloride: 106 mmol/L (ref 98–110)
Creat: 0.82 mg/dL (ref 0.70–1.33)
Glucose, Bld: 83 mg/dL (ref 65–99)
POTASSIUM: 3.9 mmol/L (ref 3.5–5.3)
Sodium: 140 mmol/L (ref 135–146)
TOTAL PROTEIN: 7.1 g/dL (ref 6.1–8.1)
Total Bilirubin: 1 mg/dL (ref 0.2–1.2)

## 2016-03-01 LAB — LIPID PANEL
Cholesterol: 252 mg/dL — ABNORMAL HIGH (ref 125–200)
HDL: 86 mg/dL (ref >=40)
LDL Cholesterol: 147 mg/dL — ABNORMAL HIGH (ref <130)
TRIGLYCERIDES: 96 mg/dL (ref <150)
Total CHOL/HDL Ratio: 2.9 Ratio (ref <=5.0)
VLDL: 19 mg/dL (ref <30)

## 2016-03-01 LAB — B12 AND FOLATE PANEL
Folate: 8.6 ng/mL (ref >5.4)
Vitamin B-12: 431 pg/mL (ref 200–1100)

## 2016-03-01 LAB — TSH: TSH: 0.97 m[IU]/L (ref 0.40–4.50)

## 2016-03-01 LAB — PSA, MEDICARE

## 2016-03-01 LAB — VITAMIN D 25 HYDROXY (VIT D DEFICIENCY, FRACTURES): VIT D 25 HYDROXY: 35 ng/mL (ref 30–100)

## 2016-03-03 ENCOUNTER — Telehealth: Payer: Self-pay | Admitting: Family Medicine

## 2016-03-03 NOTE — Telephone Encounter (Signed)
Patient aware that results will be discussed at ov.

## 2016-03-03 NOTE — Telephone Encounter (Signed)
Wesley Brennan is calling asking for Lab Results, please advise?

## 2016-03-08 ENCOUNTER — Ambulatory Visit (INDEPENDENT_AMBULATORY_CARE_PROVIDER_SITE_OTHER): Payer: Medicare Other | Admitting: Family Medicine

## 2016-03-08 ENCOUNTER — Encounter: Payer: Self-pay | Admitting: Family Medicine

## 2016-03-08 VITALS — BP 118/64 | HR 90 | Resp 16 | Ht 71.0 in | Wt 157.4 lb

## 2016-03-08 DIAGNOSIS — F10229 Alcohol dependence with intoxication, unspecified: Secondary | ICD-10-CM

## 2016-03-08 DIAGNOSIS — Z1211 Encounter for screening for malignant neoplasm of colon: Secondary | ICD-10-CM | POA: Diagnosis not present

## 2016-03-08 DIAGNOSIS — F17200 Nicotine dependence, unspecified, uncomplicated: Secondary | ICD-10-CM | POA: Diagnosis not present

## 2016-03-08 DIAGNOSIS — E785 Hyperlipidemia, unspecified: Secondary | ICD-10-CM

## 2016-03-08 DIAGNOSIS — Z23 Encounter for immunization: Secondary | ICD-10-CM

## 2016-03-08 LAB — HEMOCCULT GUIAC POC 1CARD (OFFICE): FECAL OCCULT BLD: NEGATIVE

## 2016-03-08 NOTE — Addendum Note (Signed)
Addended by: Eual Fines on: 03/08/2016 10:32 AM   Modules accepted: Orders

## 2016-03-08 NOTE — Progress Notes (Signed)
   CLELLAN GANGULY     MRN: [id number]      DOB: Nov 22, 1960   HPI Mr. Saunders is here for follow up and re-evaluation of chronic medical conditions, medication management and review of any available recent lab and radiology data.  Preventive health is updated, specifically  Cancer screening and Immunization.   Questions or concerns regarding consultations or procedures which the PT has had in the interim are  addressed. The PT denies any adverse reactions to current medications since the last visit.  There are no new concerns.  There are no specific complaints   ROS Denies recent fever or chills. Denies sinus pressure, nasal congestion, ear pain or sore throat. Denies chest congestion, productive cough or wheezing. Denies chest pains, palpitations and leg swelling Denies abdominal pain, nausea, vomiting,diarrhea or constipation.   Denies dysuria, frequency, hesitancy or incontinence. Denies joint pain, swelling and limitation in mobility. Denies headaches, seizures, numbness, or tingling. Denies depression, anxiety or insomnia. Denies skin break down or rash.   PE  BP 118/64   Pulse 90   Resp 16   Ht 5\' 11"  (1.803 m)   Wt 157 lb 6.4 oz (71.4 kg)   SpO2 98%   BMI 21.95 kg/m   Patient alert and oriented and in no cardiopulmonary distress.  HEENT: No facial asymmetry, EOMI,   oropharynx pink and moist.  Neck supple no JVD, no mass.  Chest: Clear to auscultation bilaterally.  CVS: S1, S2 no murmurs, no S3.Regular rate.  ABD: Soft non tender.  Rectal: no mass, heme negative stool Ext: No edema  MS: Adequate ROM spine, shoulders, hips and knees.  Skin: Intact, no ulcerations or rash noted.  Psych: Good eye contact, normal affect. Memory intact not anxious or depressed appearing.  CNS: CN 2-12 intact, power,  normal throughout.no focal deficits noted.   Assessment & Plan  Hyperlipidemia LDL goal <100 Uncontrolled Hyperlipidemia:Low fat diet discussed and  encouraged.   Lipid Panel  Lab Results  Component Value Date   CHOL 252 (H) 02/29/2016   HDL 86 02/29/2016   LDLCALC 147 (H) 02/29/2016   TRIG 96 02/29/2016   CHOLHDL 2.9 02/29/2016  Updated lab needed at/ before next visit.      NICOTINE ADDICTION Patient counseled for approximately 5 minutes regarding the health risks of ongoing nicotine use, specifically all types of cancer, heart disease, stroke and respiratory failure. The options available for help with cessation ,the behavioral changes to assist the process, and the option to either gradully reduce usage  Or abruptly stop.is also discussed. Pt is also encouraged to set specific goals in number of cigarettes used daily, as well as to set a quit date.     Need for prophylactic vaccination and inoculation against influenza After obtaining informed consent, the vaccine is  administered by LPN.   Special screening for malignant neoplasms, colon Heme negative stool

## 2016-03-08 NOTE — Progress Notes (Signed)
NOTED & DONE 

## 2016-03-08 NOTE — Assessment & Plan Note (Signed)
Uncontrolled Hyperlipidemia:Low fat diet discussed and encouraged.   Lipid Panel  Lab Results  Component Value Date   CHOL 252 (H) 02/29/2016   HDL 86 02/29/2016   LDLCALC 147 (H) 02/29/2016   TRIG 96 02/29/2016   CHOLHDL 2.9 02/29/2016  Updated lab needed at/ before next visit.

## 2016-03-08 NOTE — Assessment & Plan Note (Signed)
Heme negative stool 

## 2016-03-08 NOTE — Patient Instructions (Signed)
Annual wellness March 3 or after  Labs are very good, cut back on cheese and fried foods  Influenza vaccine today, and please no more  Cigarettes after today, cut down to 20 ounces of beer  Rectal exam today  Thank you  for choosing [facility]. We consider it a privelige to serve you.  Delivering excellent health care in a caring and  compassionate way is our goal.  Partnering with you,  so that together we can achieve this goal is our strategy.

## 2016-03-08 NOTE — Assessment & Plan Note (Signed)
After obtaining informed consent, the vaccine is  administered by LPN.  

## 2016-03-08 NOTE — Assessment & Plan Note (Signed)

## 2016-05-25 ENCOUNTER — Telehealth: Payer: Self-pay | Admitting: Family Medicine

## 2016-05-25 NOTE — Telephone Encounter (Signed)
Wesley Brennan is c/o of gout in his left foot, he is keeping it elevated and has it propped up on a stool c/o it being very sore and swollen, hes asking what else can he do?

## 2016-05-25 NOTE — Telephone Encounter (Signed)
Spoke with patient and he will come in and be seen on 11/10

## 2016-05-26 ENCOUNTER — Ambulatory Visit (INDEPENDENT_AMBULATORY_CARE_PROVIDER_SITE_OTHER): Payer: Medicare Other | Admitting: Family Medicine

## 2016-05-26 ENCOUNTER — Encounter: Payer: Self-pay | Admitting: Family Medicine

## 2016-05-26 VITALS — BP 120/70 | HR 76 | Temp 98.1°F | Resp 18 | Ht 71.0 in | Wt 164.1 lb

## 2016-05-26 DIAGNOSIS — Z23 Encounter for immunization: Secondary | ICD-10-CM | POA: Diagnosis not present

## 2016-05-26 DIAGNOSIS — M10472 Other secondary gout, left ankle and foot: Secondary | ICD-10-CM | POA: Diagnosis not present

## 2016-05-26 MED ORDER — INDOMETHACIN 50 MG PO CAPS: 50.0000 mg | ORAL_CAPSULE | Freq: Two times a day (BID) | ORAL | 0 refills | Status: DC

## 2016-05-26 MED ORDER — METHYLPREDNISOLONE ACETATE 80 MG/ML IJ SUSP
80.0000 mg | Freq: Once | INTRAMUSCULAR | Status: AC
  Administered 2016-05-26: 80 mg via INTRAMUSCULAR

## 2016-05-26 NOTE — Progress Notes (Signed)
    Chief Complaint  Patient presents with  . Pain    left foot x 1 week   Known gout Known alcoholic He has had non traumatic foot pain for several days Can hardly put weight on the leg Is out of his gout medicine   Patient Active Problem List   Diagnosis Date Noted  . Tubular adenoma of colon 07/08/2015  . Special screening for malignant neoplasms, colon   . Vision loss, bilateral 04/27/2014  . Need for prophylactic vaccination and inoculation against influenza 04/27/2014  . Allergic rhinitis 12/15/2013  . Hyperlipidemia LDL goal <100 06/26/2008  . Alcohol dependence ([place]) 06/26/2008  . NICOTINE ADDICTION 11/27/2007  . MENTAL RETARDATION 11/27/2007    Outpatient Encounter Prescriptions as of 05/26/2016  Medication Sig  . indomethacin (INDOCIN) 50 MG capsule Take 1 capsule (50 mg total) by mouth 2 (two) times daily with a meal. FOR GOUT  . [EXPIRED] methylPREDNISolone acetate (DEPO-MEDROL) injection 80 mg    No facility-administered encounter medications on file as of 05/26/2016.     No Known Allergies  Review of Systems  Constitutional: Positive for activity change. Negative for appetite change, fever and unexpected weight change.       Painful foot  Respiratory: Negative for cough and shortness of breath.   Cardiovascular: Positive for leg swelling. Negative for chest pain and palpitations.       Swollen foot  Gastrointestinal: Negative for constipation and diarrhea.  Genitourinary: Negative for difficulty urinating and frequency.  Musculoskeletal: Positive for arthralgias and gait problem.  Psychiatric/Behavioral: Negative for behavioral problems and sleep disturbance.   States otherwise is well  BP 120/70 (BP Location: Right Arm, Patient Position: Sitting, Cuff Size: Normal)   Pulse 76   Temp 98.1 F (36.7 C) (Oral)   Resp 18   Ht 5\' 11"  (1.803 m)   Wt 164 lb 1.3 oz (74.4 kg)   SpO2 98%   BMI 22.88 kg/m   Physical Exam  Constitutional: He is oriented  to person, place, and time. He appears well-developed and well-nourished. No distress.  HENT:  Head: Normocephalic and atraumatic.  Mouth/Throat: Oropharynx is clear and moist.  edentulous  Cardiovascular: Normal rate, regular rhythm and normal heart sounds.   Pulmonary/Chest: Effort normal and breath sounds normal. He has no wheezes.  Musculoskeletal:       Feet:  Left foot with erythema and soft tissue swelling laterally, tender 4th and 5th MTP joints.  Neurological: He is alert and oriented to person, place, and time.  Skin: There is erythema.  Psychiatric: He has a normal mood and affect. His behavior is normal. Thought content normal.    ASSESSMENT/PLAN:  1. Acute gout due to other secondary cause involving toe of left foot  - methylPREDNISolone acetate (DEPO-MEDROL) injection 80 mg; Inject 1 mL (80 mg total) into the muscle once.   Patient Instructions  Take the indomethacin for gout Take with food  See Dr Moshe Cipro on regular schedule   Raylene Everts, MD

## 2016-05-26 NOTE — Patient Instructions (Signed)
Take the indomethacin for gout Take with food  See Dr Moshe Cipro on regular schedule

## 2016-05-29 ENCOUNTER — Other Ambulatory Visit: Payer: Self-pay | Admitting: Family Medicine

## 2016-05-29 MED ORDER — NAPROXEN 500 MG PO TABS: ORAL_TABLET | ORAL | 0 refills | Status: DC

## 2016-09-19 ENCOUNTER — Ambulatory Visit: Payer: Medicare Other

## 2016-10-30 ENCOUNTER — Ambulatory Visit (INDEPENDENT_AMBULATORY_CARE_PROVIDER_SITE_OTHER): Payer: Medicare Other

## 2016-10-30 VITALS — BP 128/88 | HR 80 | Temp 98.7°F | Ht 71.0 in | Wt 157.0 lb

## 2016-10-30 DIAGNOSIS — Z0000 Encounter for general adult medical examination without abnormal findings: Secondary | ICD-10-CM | POA: Diagnosis not present

## 2016-10-30 NOTE — Progress Notes (Signed)
Subjective:   Wesley Brennan is a 56 y.o. male who presents for Medicare Annual/Subsequent preventive examination.  Review of Systems:  Cardiac Risk Factors include: advanced age (>68men, >23 women);dyslipidemia;male gender;smoking/ tobacco exposure     Objective:    Vitals: BP 128/88   Pulse 80   Temp 98.7 F (37.1 C) (Oral)   Ht 5\' 11"  (1.803 m)   Wt 157 lb 0.6 oz (71.2 kg)   SpO2 96%   BMI 21.90 kg/m   Body mass index is 21.9 kg/m.  Tobacco History  Smoking Status  . Current Every Day Smoker  . Packs/day: 0.25  . Years: 40.00  . Types: Cigarettes  Smokeless Tobacco  . Never Used    Comment: smokes maybe 1 per day      Ready to quit: Not Answered Counseling given: Not Answered   Past Medical History:  Diagnosis Date  . Hypercholesteremia   . Hypertension    related to drug use which he has quit  . Substance abuse    alcohol, nicotine, h/o coacaine and marijuana   Past Surgical History:  Procedure Laterality Date  . COLONOSCOPY N/A 07/02/2015   Procedure: COLONOSCOPY;  Surgeon: Danie Binder, MD;  Location: [facility];  Service: Endoscopy;  Laterality: N/A;  1:00 PM  . ELBOW FUSION  1993   left elbow dislocation  . ELBOW FUSION Left 1979  . HERNIA REPAIR  1610 approx   umbilical hernia   Family History  Problem Relation Age of Onset  . Adopted: Yes  . Cancer Mother 68    Bone   . Diabetes Mother   . Hypertension Sister   . Stroke Father 9  . Heart disease Father 32  . Diabetes Father   . Colon cancer Neg Hx    History  Sexual Activity  . Sexual activity: Yes  . Birth control/ protection: Condom    Outpatient Encounter Prescriptions as of 10/30/2016  Medication Sig  . indomethacin (INDOCIN) 50 MG capsule Take 1 capsule (50 mg total) by mouth 2 (two) times daily with a meal. FOR GOUT (Patient not taking: Reported on 10/30/2016)  . naproxen (NAPROSYN) 500 MG tablet Take one tablet twice a day with food at first sign of GOUT (Patient not  taking: Reported on 10/30/2016)   No facility-administered encounter medications on file as of 10/30/2016.     Activities of Daily Living In your present state of health, do you have any difficulty performing the following activities: 10/30/2016  Hearing? N  Vision? N  Difficulty concentrating or making decisions? N  Walking or climbing stairs? N  Dressing or bathing? N  Doing errands, shopping? N  Preparing Food and eating ? N  Using the Toilet? N  In the past six months, have you accidently leaked urine? N  Do you have problems with loss of bowel control? N  Managing your Medications? N  Managing your Finances? N  Housekeeping or managing your Housekeeping? N  Some recent data might be hidden    Patient Care Team: Fayrene Helper, MD as PCP - General   Assessment:    Exercise Activities and Dietary recommendations Current Exercise Habits: The patient has a physically strenous job, but has no regular exercise apart from work., Exercise limited by: None identified  Goals    . Reduce alcohol intake to 1 servings per day      Fall Risk Fall Risk  10/30/2016 05/26/2016 02/01/2015 04/27/2014  Falls in the past year? No  No Yes No  Number falls in past yr: - - 2 or more -  Injury with Fall? - - Yes -  Risk Factor Category  - - High Fall Risk -  Risk for fall due to : - - History of fall(s);Mental status change -   Depression Screen PHQ 2/9 Scores 10/30/2016 05/26/2016 02/01/2015 12/15/2013  PHQ - 2 Score 0 0 0 3  PHQ- 9 Score - - - 8    Cognitive Function: Normal    6CIT Screen 10/30/2016  What Year? 0 points  What month? 0 points  What time? 0 points  Count back from 20 0 points  Months in reverse 0 points  Repeat phrase 0 points  Total Score 0    Immunization History  Administered Date(s) Administered  . H1N1 06/26/2008  . Influenza Split 05/01/2012  . Influenza Whole 04/11/2006  . Influenza,inj,Quad PF,36+ Mos 05/02/2013, 04/27/2014, 06/17/2015, 03/08/2016  .  Pneumococcal Conjugate-13 09/09/2014  . Td 04/17/2006  . Tdap 05/26/2016   Screening Tests Health Maintenance  Topic Date Due  . INFLUENZA VACCINE  02/14/2017  . COLONOSCOPY  07/01/2025  . TETANUS/TDAP  05/26/2026  . Hepatitis C Screening  Completed  . HIV Screening  Completed      Plan:  I have personally reviewed and addressed the Medicare Annual Wellness questionnaire and have noted the following in the patient's chart:  A. Medical and social history B. Use of alcohol, tobacco or illicit drugs  C. Current medications and supplements D. Functional ability and status E.  Nutritional status F.  Physical activity G. Advance directives H. List of other physicians I.  Hospitalizations, surgeries, and ER visits in previous 12 months J.  [place] to include cognitive, depression, and falls L. Referrals and appointments - none  In addition, I have reviewed and discussed with patient certain preventive protocols, quality metrics, and best practice recommendations. A written personalized care plan for preventive services as well as general preventive health recommendations were provided to patient.  Signed,   Stormy Fabian, LPN Lead Nurse Health Advisor

## 2016-10-30 NOTE — Patient Instructions (Addendum)
[name] , Thank you for taking time to come for your Medicare Wellness Visit. I appreciate your ongoing commitment to your health goals. Please review the following plan we discussed and let me know if I can assist you in the future.   Screening recommendations/referrals: Colonoscopy: Up to date, next due 06/2025 Recommended yearly ophthalmology/optometry visit for glaucoma screening and checkup Recommended yearly dental visit for hygiene and checkup  Vaccinations: Influenza vaccine: Up to date, next due 02/2017 Pneumococcal vaccine: Up to date, next due at age 52 Tdap vaccine: Up to date, next due 05/2026 Shingles vaccine: Due at age 50    Advanced directives: Please bring a copy of your POA (Power of [place]) and/or Living Will to your next appointment.   Conditions/risks identified: Limit your alcohol intake.   Next appointment: Follow up in 1 month with Dr. Moshe Cipro. Follow up in 1 year for your annual wellness visit.   Preventive Care 40-64 Years, Male Preventive care refers to lifestyle choices and visits with your health care provider that can promote health and wellness. What does preventive care include?  A yearly physical exam. This is also called an annual well check.  Dental exams once or twice a year.  Routine eye exams. Ask your health care provider how often you should have your eyes checked.  Personal lifestyle choices, including:  Daily care of your teeth and gums.  Regular physical activity.  Eating a healthy diet.  Avoiding tobacco and drug use.  Limiting alcohol use.  Practicing safe sex.  Taking low-dose aspirin every day starting at age 60. What happens during an annual well check? The services and screenings done by your health care provider during your annual well check will depend on your age, overall health, lifestyle risk factors, and family history of disease. Counseling  Your health care provider may ask you questions about  your:  Alcohol use.  Tobacco use.  Drug use.  Emotional well-being.  Home and relationship well-being.  Sexual activity.  Eating habits.  Work and work Statistician. Screening  You may have the following tests or measurements:  Height, weight, and BMI.  Blood pressure.  Lipid and cholesterol levels. These may be checked every 5 years, or more frequently if you are over 41 years old.  Skin check.  Lung cancer screening. You may have this screening every year starting at age 9 if you have a 30-pack-year history of smoking and currently smoke or have quit within the past 15 years.  Fecal occult blood test (FOBT) of the stool. You may have this test every year starting at age 57.  Flexible sigmoidoscopy or colonoscopy. You may have a sigmoidoscopy every 5 years or a colonoscopy every 10 years starting at age 68.  Prostate cancer screening. Recommendations will vary depending on your family history and other risks.  Hepatitis C blood test.  Hepatitis B blood test.  Sexually transmitted disease (STD) testing.  Diabetes screening. This is done by checking your blood sugar (glucose) after you have not eaten for a while (fasting). You may have this done every 1-3 years. Discuss your test results, treatment options, and if necessary, the need for more tests with your health care provider. Vaccines  Your health care provider may recommend certain vaccines, such as:  Influenza vaccine. This is recommended every year.  Tetanus, diphtheria, and acellular pertussis (Tdap, Td) vaccine. You may need a Td booster every 10 years.  Zoster vaccine. You may need this after age 74.  Pneumococcal 13-valent  conjugate (PCV13) vaccine. You may need this if you have certain conditions and have not been vaccinated.  Pneumococcal polysaccharide (PPSV23) vaccine. You may need one or two doses if you smoke cigarettes or if you have certain conditions. Talk to your health care provider about  which screenings and vaccines you need and how often you need them. This information is not intended to replace advice given to you by your health care provider. Make sure you discuss any questions you have with your health care provider. Document Released: 07/30/2015 Document Revised: 03/22/2016 Document Reviewed: 05/04/2015 Elsevier Interactive Patient Education  2017 [place] Prevention in the Home Falls can cause injuries. They can happen to people of all ages. There are many things you can do to make your home safe and to help prevent falls. What can I do on the outside of my home?  Regularly fix the edges of walkways and driveways and fix any cracks.  Remove anything that might make you trip as you walk through a door, such as a raised step or threshold.  Trim any bushes or trees on the path to your home.  Use bright outdoor lighting.  Clear any walking paths of anything that might make someone trip, such as rocks or tools.  Regularly check to see if handrails are loose or broken. Make sure that both sides of any steps have handrails.  Any raised decks and porches should have guardrails on the edges.  Have any leaves, snow, or ice cleared regularly.  Use sand or salt on walking paths during winter.  Clean up any spills in your garage right away. This includes oil or grease spills. What can I do in the bathroom?  Use night lights.  Install grab bars by the toilet and in the tub and shower. Do not use towel bars as grab bars.  Use non-skid mats or decals in the tub or shower.  If you need to sit down in the shower, use a plastic, non-slip stool.  Keep the floor dry. Clean up any water that spills on the floor as soon as it happens.  Remove soap buildup in the tub or shower regularly.  Attach bath mats securely with double-sided non-slip rug tape.  Do not have throw rugs and other things on the floor that can make you trip. What can I do in the  bedroom?  Use night lights.  Make sure that you have a light by your bed that is easy to reach.  Do not use any sheets or blankets that are too big for your bed. They should not hang down onto the floor.  Have a firm chair that has side arms. You can use this for support while you get dressed.  Do not have throw rugs and other things on the floor that can make you trip. What can I do in the kitchen?  Clean up any spills right away.  Avoid walking on wet floors.  Keep items that you use a lot in easy-to-reach places.  If you need to reach something above you, use a strong step stool that has a grab bar.  Keep electrical cords out of the way.  Do not use floor polish or wax that makes floors slippery. If you must use wax, use non-skid floor wax.  Do not have throw rugs and other things on the floor that can make you trip. What can I do with my stairs?  Do not leave any items on the stairs.  Make sure that there are handrails on both sides of the stairs and use them. Fix handrails that are broken or loose. Make sure that handrails are as long as the stairways.  Check any carpeting to make sure that it is firmly attached to the stairs. Fix any carpet that is loose or worn.  Avoid having throw rugs at the top or bottom of the stairs. If you do have throw rugs, attach them to the floor with carpet tape.  Make sure that you have a light switch at the top of the stairs and the bottom of the stairs. If you do not have them, ask someone to add them for you. What else can I do to help prevent falls?  Wear shoes that:  Do not have high heels.  Have rubber bottoms.  Are comfortable and fit you well.  Are closed at the toe. Do not wear sandals.  If you use a stepladder:  Make sure that it is fully opened. Do not climb a closed stepladder.  Make sure that both sides of the stepladder are locked into place.  Ask someone to hold it for you, if possible.  Clearly mark and make  sure that you can see:  Any grab bars or handrails.  First and last steps.  Where the edge of each step is.  Use tools that help you move around (mobility aids) if they are needed. These include:  Canes.  Walkers.  Scooters.  Crutches.  Turn on the lights when you go into a dark area. Replace any light bulbs as soon as they burn out.  Set up your furniture so you have a clear path. Avoid moving your furniture around.  If any of your floors are uneven, fix them.  If there are any pets around you, be aware of where they are.  Review your medicines with your doctor. Some medicines can make you feel dizzy. This can increase your chance of falling. Ask your doctor what other things that you can do to help prevent falls. This information is not intended to replace advice given to you by your health care provider. Make sure you discuss any questions you have with your health care provider. Document Released: 04/29/2009 Document Revised: 12/09/2015 Document Reviewed: 08/07/2014 Elsevier Interactive Patient Education  2017 [employer].

## 2016-12-13 ENCOUNTER — Encounter: Payer: Self-pay | Admitting: Family Medicine

## 2016-12-13 ENCOUNTER — Ambulatory Visit (HOSPITAL_COMMUNITY)
Admission: RE | Admit: 2016-12-13 | Discharge: 2016-12-13 | Disposition: A | Discharge status: Home / Self Care | Payer: Medicare Other | Source: Ambulatory Visit | Attending: Family Medicine | Admitting: Family Medicine

## 2016-12-13 ENCOUNTER — Ambulatory Visit (INDEPENDENT_AMBULATORY_CARE_PROVIDER_SITE_OTHER): Payer: Medicare Other | Admitting: Family Medicine

## 2016-12-13 VITALS — BP 110/74 | HR 79 | Resp 16 | Ht 71.0 in | Wt 151.0 lb

## 2016-12-13 DIAGNOSIS — M940 Chondrocostal junction syndrome [Tietze]: Secondary | ICD-10-CM

## 2016-12-13 DIAGNOSIS — E785 Hyperlipidemia, unspecified: Secondary | ICD-10-CM | POA: Diagnosis not present

## 2016-12-13 DIAGNOSIS — G8929 Other chronic pain: Secondary | ICD-10-CM

## 2016-12-13 DIAGNOSIS — Z720 Tobacco use: Secondary | ICD-10-CM | POA: Insufficient documentation

## 2016-12-13 DIAGNOSIS — F17200 Nicotine dependence, unspecified, uncomplicated: Secondary | ICD-10-CM | POA: Diagnosis not present

## 2016-12-13 DIAGNOSIS — F10229 Alcohol dependence with intoxication, unspecified: Secondary | ICD-10-CM | POA: Diagnosis not present

## 2016-12-13 DIAGNOSIS — S4992XA Unspecified injury of left shoulder and upper arm, initial encounter: Secondary | ICD-10-CM | POA: Diagnosis not present

## 2016-12-13 DIAGNOSIS — M25562 Pain in left knee: Secondary | ICD-10-CM | POA: Insufficient documentation

## 2016-12-13 DIAGNOSIS — M25512 Pain in left shoulder: Secondary | ICD-10-CM | POA: Diagnosis not present

## 2016-12-13 DIAGNOSIS — M1712 Unilateral primary osteoarthritis, left knee: Secondary | ICD-10-CM | POA: Diagnosis not present

## 2016-12-13 DIAGNOSIS — F17210 Nicotine dependence, cigarettes, uncomplicated: Secondary | ICD-10-CM

## 2016-12-13 LAB — LIPID PANEL
CHOL/HDL RATIO: 3.3 ratio (ref <5.0)
CHOLESTEROL: 236 mg/dL — AB (ref <200)
HDL: 72 mg/dL (ref >40)
LDL Cholesterol: 97 mg/dL (ref <100)
Triglycerides: 335 mg/dL — ABNORMAL HIGH (ref <150)
VLDL: 67 mg/dL — ABNORMAL HIGH (ref <30)

## 2016-12-13 LAB — CBC
HCT: 42.7 % (ref 38.5–50.0)
Hemoglobin: 13.8 g/dL (ref 13.2–17.1)
MCH: 32.1 pg (ref 27.0–33.0)
MCHC: 32.3 g/dL (ref 32.0–36.0)
MCV: 99.3 fL (ref 80.0–100.0)
MPV: 9.4 fL (ref 7.5–12.5)
PLATELETS: 267 10*3/uL (ref 140–400)
RBC: 4.3 MIL/uL (ref 4.20–5.80)
RDW: 12.8 % (ref 11.0–15.0)
WBC: 5.3 10*3/uL (ref 3.8–10.8)

## 2016-12-13 LAB — BASIC METABOLIC PANEL
BUN: 19 mg/dL (ref 7–25)
CALCIUM: 9.5 mg/dL (ref 8.6–10.3)
CO2: 27 mmol/L (ref 20–31)
Chloride: 103 mmol/L (ref 98–110)
Creat: 1 mg/dL (ref 0.70–1.33)
GLUCOSE: 73 mg/dL (ref 65–99)
POTASSIUM: 4.8 mmol/L (ref 3.5–5.3)
Sodium: 138 mmol/L (ref 135–146)

## 2016-12-13 MED ORDER — ACETAMINOPHEN 500 MG PO TABS: 500.0000 mg | ORAL_TABLET | Freq: Three times a day (TID) | ORAL | 0 refills | Status: DC | PRN

## 2016-12-13 MED ORDER — PREDNISONE 5 MG PO TABS: 5.0000 mg | ORAL_TABLET | Freq: Two times a day (BID) | ORAL | 0 refills | Status: AC

## 2016-12-13 MED ORDER — RANITIDINE HCL 300 MG PO TABS
300.0000 mg | ORAL_TABLET | Freq: Every day | ORAL | 0 refills | Status: DC
Start: 2016-12-13 — End: 2017-10-17

## 2016-12-13 MED ORDER — MELOXICAM 15 MG PO TABS: 15.0000 mg | ORAL_TABLET | Freq: Every day | ORAL | 0 refills | Status: DC

## 2016-12-13 NOTE — Patient Instructions (Addendum)
F/u with rectal in 4 month, call if you need me before  Xray of left shoulder and left knee today  Tylenol 500 mg twice daily for 1 week for knee and shoulder pain and 1 week of meloxicam, and prednisone for 5 days and zantac  For 1 week   Plan to reduce 5 ciggs per day in next 4 months  Lipid, chem 7, cBC today  Call 1800 quit now  Stop alcohol    Steps to Quit Smoking Smoking tobacco can be bad for your health. It can also affect almost every organ in your body. Smoking puts you and people around you at risk for many serious long-lasting (chronic) diseases. Quitting smoking is hard, but it is one of the best things that you can do for your health. It is never too late to quit. What are the benefits of quitting smoking? When you quit smoking, you lower your risk for getting serious diseases and conditions. They can include:  Lung cancer or lung disease.  Heart disease.  Stroke.  Heart attack.  Not being able to have children (infertility).  Weak bones (osteoporosis) and broken bones (fractures). If you have coughing, wheezing, and shortness of breath, those symptoms may get better when you quit. You may also get sick less often. If you are pregnant, quitting smoking can help to lower your chances of having a baby of low birth weight. What can I do to help me quit smoking? Talk with your doctor about what can help you quit smoking. Some things you can do (strategies) include:  Quitting smoking totally, instead of slowly cutting back how much you smoke over a period of time.  Going to in-person counseling. You are more likely to quit if you go to many counseling sessions.  Using resources and support systems, such as:  Online chats with a Social worker.  Phone quitlines.  Printed Furniture conservator/restorer.  Support groups or group counseling.  Text messaging programs.  Mobile phone apps or applications.  Taking medicines. Some of these medicines may have nicotine in them.  If you are pregnant or breastfeeding, do not take any medicines to quit smoking unless your doctor says it is okay. Talk with your doctor about counseling or other things that can help you. Talk with your doctor about using more than one strategy at the same time, such as taking medicines while you are also going to in-person counseling. This can help make quitting easier. What things can I do to make it easier to quit? Quitting smoking might feel very hard at first, but there is a lot that you can do to make it easier. Take these steps:  Talk to your family and friends. Ask them to support and encourage you.  Call phone quitlines, reach out to support groups, or work with a Social worker.  Ask people who smoke to not smoke around you.  Avoid places that make you want (trigger) to smoke, such as:  Bars.  Parties.  Smoke-break areas at work.  Spend time with people who do not smoke.  Lower the stress in your life. Stress can make you want to smoke. Try these things to help your stress:  Getting regular exercise.  Deep-breathing exercises.  Yoga.  Meditating.  Doing a body scan. To do this, close your eyes, focus on one area of your body at a time from head to toe, and notice which parts of your body are tense. Try to relax the muscles in those areas.  Download  or buy apps on your mobile phone or tablet that can help you stick to your quit plan. There are many free apps, such as QuitGuide from the [employer] Office manager for Disease Control and Prevention). You can find more support from smokefree.gov and other websites. This information is not intended to replace advice given to you by your health care provider. Make sure you discuss any questions you have with your health care provider. Document Released: 04/29/2009 Document Revised: 02/29/2016 Document Reviewed: 11/17/2014 Elsevier Interactive Patient Education  2017 [employer].

## 2016-12-13 NOTE — Assessment & Plan Note (Addendum)
Updated lab needed , done on day of visit , needs medication Hyperlipidemia:Low fat diet discussed and encouraged.   Lipid Panel  Lab Results  Component Value Date   CHOL 252 (H) 02/29/2016   HDL 86 02/29/2016   LDLCALC 147 (H) 02/29/2016   TRIG 96 02/29/2016   CHOLHDL 2.9 02/29/2016
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
## 2016-12-14 DIAGNOSIS — M940 Chondrocostal junction syndrome [Tietze]: Secondary | ICD-10-CM | POA: Insufficient documentation

## 2016-12-14 DIAGNOSIS — M25512 Pain in left shoulder: Secondary | ICD-10-CM | POA: Insufficient documentation
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
## 2016-12-14 NOTE — Assessment & Plan Note (Signed)
Chronic pain and crepitus wit intermittent instability, mild arthritis, x ray today, tylenol as needed for pain, short course of meloxicam

## 2016-12-14 NOTE — Assessment & Plan Note (Signed)
Left 3rd and 4th , prednisone x 5 days and meloxicamx 1 week

## 2016-12-14 NOTE — Assessment & Plan Note (Signed)
Patient is asked and  confirms current  Nicotine use.  Five to seven minutes of time is spent in counseling the patient of the need to quit smoking  Advice to quit is delivered clearly specifically in reducing the risk of developing heart disease, having a stroke, or of developing all types of cancer, especially lung and oral cancer. Improvement in breathing and exercise tolerance and quality of life is also discussed, as is the economic benefit.  Assessment of willingness to quit or to make an attempt to quit is made and documented  Assistance in quit attempt is made with several and varied options presented, based on patient's desire and need. These include  literature, local classes available, 1800 QUIT NOW number, OTC and prescription medication.  The GOAL to be NICOTINE FREE is re emphasized.  The patient has set a personal goal of either reduction or discontinuation and follow up is arranged between 6 an 16 weeks.  Plan is 5 per day in next 4 months

## 2016-12-14 NOTE — Assessment & Plan Note (Signed)
Reports drinking 40 oz beer daily, needs to quit, counseled

## 2016-12-22 ENCOUNTER — Telehealth: Payer: Self-pay

## 2016-12-22 ENCOUNTER — Ambulatory Visit (HOSPITAL_COMMUNITY): Admission: RE | Admit: 2016-12-22 | Payer: Medicare Other | Source: Ambulatory Visit

## 2016-12-22 DIAGNOSIS — E785 Hyperlipidemia, unspecified: Secondary | ICD-10-CM

## 2016-12-22 NOTE — Telephone Encounter (Signed)
Lab ordered and pt to go Monday

## 2016-12-25 ENCOUNTER — Other Ambulatory Visit: Payer: Self-pay | Admitting: Family Medicine

## 2016-12-25 DIAGNOSIS — E785 Hyperlipidemia, unspecified: Secondary | ICD-10-CM | POA: Diagnosis not present

## 2016-12-25 LAB — HEPATIC FUNCTION PANEL
ALBUMIN: 3.9 g/dL (ref 3.6–5.1)
ALT: 30 U/L (ref 9–46)
AST: 45 U/L — ABNORMAL HIGH (ref 10–35)
Alkaline Phosphatase: 70 U/L (ref 40–115)
BILIRUBIN DIRECT: 0.2 mg/dL (ref <=0.2)
BILIRUBIN TOTAL: 0.7 mg/dL (ref 0.2–1.2)
Indirect Bilirubin: 0.5 mg/dL (ref 0.2–1.2)
Total Protein: 7.4 g/dL (ref 6.1–8.1)

## 2016-12-25 MED ORDER — PRAVASTATIN SODIUM 40 MG PO TABS: 40.0000 mg | ORAL_TABLET | Freq: Every day | ORAL | 1 refills | Status: DC

## 2017-04-09 ENCOUNTER — Other Ambulatory Visit: Payer: Self-pay

## 2017-04-16 ENCOUNTER — Ambulatory Visit: Payer: Medicare Other | Admitting: Family Medicine

## 2017-04-18 ENCOUNTER — Encounter: Payer: Self-pay | Admitting: Family Medicine

## 2017-04-18 ENCOUNTER — Ambulatory Visit (INDEPENDENT_AMBULATORY_CARE_PROVIDER_SITE_OTHER): Payer: Medicare Other | Admitting: Family Medicine

## 2017-04-18 VITALS — BP 138/82 | HR 56 | Temp 98.5°F | Resp 16 | Ht 71.0 in | Wt 145.2 lb

## 2017-04-18 DIAGNOSIS — Z125 Encounter for screening for malignant neoplasm of prostate: Secondary | ICD-10-CM | POA: Diagnosis not present

## 2017-04-18 DIAGNOSIS — E785 Hyperlipidemia, unspecified: Secondary | ICD-10-CM

## 2017-04-18 DIAGNOSIS — Z1211 Encounter for screening for malignant neoplasm of colon: Secondary | ICD-10-CM

## 2017-04-18 DIAGNOSIS — Z720 Tobacco use: Secondary | ICD-10-CM

## 2017-04-18 DIAGNOSIS — Z23 Encounter for immunization: Secondary | ICD-10-CM

## 2017-04-18 LAB — HEMOCCULT GUIAC POC 1CARD (OFFICE): FECAL OCCULT BLD: NEGATIVE

## 2017-04-18 NOTE — Patient Instructions (Addendum)
Wellness first week in May with Nurse MD Visit in 6 month as follow up   Flu vaccine today  Fasting lipiid, cmp and EGFr and PSA this week and TSH    Rectal exam today  Thank you  for choosing [facility]. We consider it a privelige to serve you.  Delivering excellent health care in a caring and  compassionate way is our goal.  Partnering with you,  so that together we can achieve this goal is our strategy.

## 2017-04-18 NOTE — Progress Notes (Signed)
   Wesley Brennan     MRN: [id number]      DOB: 13-Jan-1961   HPI Wesley Brennan is here for follow up and re-evaluation of chronic medical conditions, medication management and review of any available recent lab and radiology data.  Preventive health is updated, specifically  Cancer screening and Immunization.   Questions or concerns regarding consultations or procedures which the PT has had in the interim are  addressed. The PT denies any adverse reactions to current medications since the last visit.  There are no new concerns.  There are no specific complaints   ROS Denies recent fever or chills. Denies sinus pressure, nasal congestion, ear pain or sore throat. Denies chest congestion, productive cough or wheezing. Denies chest pains, palpitations and leg swelling Denies abdominal pain, nausea, vomiting,diarrhea or constipation.   Denies dysuria, frequency, hesitancy or incontinence. Denies joint pain, swelling and limitation in mobility. Denies headaches, seizures, numbness, or tingling. Denies depression, anxiety or insomnia. Denies skin break down or rash.   PE  BP 138/82 (BP Location: Left Arm, Patient Position: Sitting, Cuff Size: Normal)   Pulse (!) 56   Temp 98.5 F (36.9 C) (Other (Comment))   Resp 16   Ht 5\' 11"  (1.803 m)   Wt 145 lb 4 oz (65.9 kg)   SpO2 98%   BMI 20.26 kg/m   Patient alert and oriented and in no cardiopulmonary distress.  HEENT: No facial asymmetry, EOMI,   oropharynx pink and moist.  Neck supple no JVD, no mass.  Chest: Clear to auscultation bilaterally.  CVS: S1, S2 no murmurs, no S3.Regular rate.  ABD: Soft non tender.  Rectal; no mass, heme negative stool Ext: No edema  MS: Adequate ROM spine, shoulders, hips and knees.  Skin: Intact, no ulcerations or rash noted.  Psych: Good eye contact, normal affect. Memory intact not anxious or depressed appearing.  CNS: CN 2-12 intact, power,  normal throughout.no focal deficits  noted.   Assessment & Plan  Hyperlipidemia LDL goal <100 Hyperlipidemia:Low fat diet discussed and encouraged.   Lipid Panel  Lab Results  Component Value Date   CHOL 192 04/20/2017   HDL 85 04/20/2017   LDLCALC 97 12/13/2016   TRIG 154 (H) 04/20/2017   CHOLHDL 2.3 04/20/2017   Triglycerides elevated Needs to reduce fried foods    Tobacco use Patient is asked and  confirms current  Nicotine use.  Five to seven minutes of time is spent in counseling the patient of the need to quit smoking  Advice to quit is delivered clearly specifically in reducing the risk of developing heart disease, having a stroke, or of developing all types of cancer, especially lung and oral cancer. Improvement in breathing and exercise tolerance and quality of life is also discussed, as is the economic benefit.  Assessment of willingness to quit or to make an attempt to quit is made and documented  Assistance in quit attempt is made with several and varied options presented, based on patient's desire and need. These include  literature, local classes available, 1800 QUIT NOW number, OTC and prescription medication.  The GOAL to be NICOTINE FREE is re emphasized.  The patient has set a personal goal of either reduction or discontinuation and follow up is arranged between 6 an 16 weeks.    Colon cancer screening Heme negative stool

## 2017-04-20 DIAGNOSIS — E785 Hyperlipidemia, unspecified: Secondary | ICD-10-CM | POA: Diagnosis not present

## 2017-04-20 DIAGNOSIS — Z125 Encounter for screening for malignant neoplasm of prostate: Secondary | ICD-10-CM | POA: Diagnosis not present

## 2017-04-20 LAB — COMPLETE METABOLIC PANEL WITH GFR
AG RATIO: 1.4 (calc) (ref 1.0–2.5)
ALKALINE PHOSPHATASE (APISO): 79 U/L (ref 40–115)
ALT: 42 U/L (ref 9–46)
AST: 73 U/L — AB (ref 10–35)
Albumin: 3.9 g/dL (ref 3.6–5.1)
BUN: 18 mg/dL (ref 7–25)
CO2: 27 mmol/L (ref 20–32)
Calcium: 9 mg/dL (ref 8.6–10.3)
Chloride: 105 mmol/L (ref 98–110)
Creat: 1.14 mg/dL (ref 0.70–1.33)
GFR, Est African American: 82 mL/min/{1.73_m2} (ref >=60)
GFR, Est Non African American: 70 mL/min/{1.73_m2} (ref >=60)
GLOBULIN: 2.8 g/dL (ref 1.9–3.7)
Glucose, Bld: 119 mg/dL — ABNORMAL HIGH (ref 65–99)
POTASSIUM: 4.1 mmol/L (ref 3.5–5.3)
SODIUM: 139 mmol/L (ref 135–146)
Total Bilirubin: 1.3 mg/dL — ABNORMAL HIGH (ref 0.2–1.2)
Total Protein: 6.7 g/dL (ref 6.1–8.1)

## 2017-04-20 LAB — LIPID PANEL
CHOL/HDL RATIO: 2.3 (calc) (ref <5.0)
CHOLESTEROL: 192 mg/dL (ref <200)
HDL: 85 mg/dL (ref >40)
LDL CHOLESTEROL (CALC): 81 mg/dL
Non-HDL Cholesterol (Calc): 107 mg/dL (calc) (ref <130)
Triglycerides: 154 mg/dL — ABNORMAL HIGH (ref <150)

## 2017-04-20 LAB — TSH: TSH: 1.62 m[IU]/L (ref 0.40–4.50)

## 2017-04-23 ENCOUNTER — Telehealth: Payer: Self-pay | Admitting: Family Medicine

## 2017-04-23 DIAGNOSIS — R7302 Impaired glucose tolerance (oral): Secondary | ICD-10-CM

## 2017-04-23 NOTE — Telephone Encounter (Signed)
-----   Message from Fayrene Helper, MD sent at 04/20/2017  3:58 PM EDT ----- Pls order hBA1C and send in to the lab, I discussed the result with the pt, his dx is impiired fasting glucose, I advised him to go to the lab  This coming Tuesday.  ?/ pls ask!

## 2017-04-23 NOTE — Telephone Encounter (Signed)
Done

## 2017-04-28 ENCOUNTER — Encounter: Payer: Self-pay | Admitting: Family Medicine

## 2017-04-28 NOTE — Assessment & Plan Note (Signed)

## 2017-04-28 NOTE — Assessment & Plan Note (Signed)
Heme negative stool 

## 2017-04-28 NOTE — Assessment & Plan Note (Signed)
Hyperlipidemia:Low fat diet discussed and encouraged.   Lipid Panel  Lab Results  Component Value Date   CHOL 192 04/20/2017   HDL 85 04/20/2017   LDLCALC 97 12/13/2016   TRIG 154 (H) 04/20/2017   CHOLHDL 2.3 04/20/2017   Triglycerides elevated Needs to reduce fried foods

## 2017-10-17 ENCOUNTER — Other Ambulatory Visit: Payer: Self-pay

## 2017-10-17 ENCOUNTER — Ambulatory Visit (INDEPENDENT_AMBULATORY_CARE_PROVIDER_SITE_OTHER): Payer: Medicare Other | Admitting: Family Medicine

## 2017-10-17 ENCOUNTER — Encounter: Payer: Self-pay | Admitting: Family Medicine

## 2017-10-17 VITALS — BP 130/80 | HR 104 | Temp 98.2°F | Resp 16 | Ht 71.0 in | Wt 155.5 lb

## 2017-10-17 DIAGNOSIS — E785 Hyperlipidemia, unspecified: Secondary | ICD-10-CM | POA: Diagnosis not present

## 2017-10-17 DIAGNOSIS — F17200 Nicotine dependence, unspecified, uncomplicated: Secondary | ICD-10-CM | POA: Diagnosis not present

## 2017-10-17 DIAGNOSIS — F10229 Alcohol dependence with intoxication, unspecified: Secondary | ICD-10-CM | POA: Diagnosis not present

## 2017-10-17 DIAGNOSIS — F17210 Nicotine dependence, cigarettes, uncomplicated: Secondary | ICD-10-CM | POA: Diagnosis not present

## 2017-10-17 DIAGNOSIS — Z125 Encounter for screening for malignant neoplasm of prostate: Secondary | ICD-10-CM | POA: Diagnosis not present

## 2017-10-17 MED ORDER — OLOPATADINE HCL 0.1 % OP SOLN: 1.0000 [drp] | Freq: Two times a day (BID) | OPHTHALMIC | 4 refills | Status: DC

## 2017-10-17 MED ORDER — PRAVASTATIN SODIUM 40 MG PO TABS: 40.0000 mg | ORAL_TABLET | Freq: Every day | ORAL | 3 refills | Status: DC

## 2017-10-17 MED ORDER — MONTELUKAST SODIUM 10 MG PO TABS
10.0000 mg | ORAL_TABLET | Freq: Every day | ORAL | 3 refills | Status: DC
Start: 2017-10-17 — End: 2018-09-09

## 2017-10-17 NOTE — Patient Instructions (Addendum)
Physical exam October 4 or after, call if you need me sooner  You need to stop smoking entirely, so that you reduce your risk of heart attack , stroke and all cancers.[place]  Now smoke 1 PPD  New medications for allergies are sent in   Lipid, cmp and EGFr and PSA as soon as possible  NEED to STOP DRINKING BEER you now drinkl 48 oz/ day   Steps to Quit Smoking Smoking tobacco can be bad for your health. It can also affect almost every organ in your body. Smoking puts you and people around you at risk for many serious long-lasting (chronic) diseases. Quitting smoking is hard, but it is one of the best things that you can do for your health. It is never too late to quit. What are the benefits of quitting smoking? When you quit smoking, you lower your risk for getting serious diseases and conditions. They can include:  Lung cancer or lung disease.  Heart disease.  Stroke.  Heart attack.  Not being able to have children (infertility).  Weak bones (osteoporosis) and broken bones (fractures).  If you have coughing, wheezing, and shortness of breath, those symptoms may get better when you quit. You may also get sick less often. If you are pregnant, quitting smoking can help to lower your chances of having a baby of low birth weight. What can I do to help me quit smoking? Talk with your doctor about what can help you quit smoking. Some things you can do (strategies) include:  Quitting smoking totally, instead of slowly cutting back how much you smoke over a period of time.  Going to in-person counseling. You are more likely to quit if you go to many counseling sessions.  Using resources and support systems, such as: ? Database administrator with a Social worker. ? Phone quitlines. ? Careers information officer. ? Support groups or group counseling. ? Text messaging programs. ? Mobile phone apps or applications.  Taking medicines. Some of these medicines may have nicotine in them. If you are  pregnant or breastfeeding, do not take any medicines to quit smoking unless your doctor says it is okay. Talk with your doctor about counseling or other things that can help you.  Talk with your doctor about using more than one strategy at the same time, such as taking medicines while you are also going to in-person counseling. This can help make quitting easier. What things can I do to make it easier to quit? Quitting smoking might feel very hard at first, but there is a lot that you can do to make it easier. Take these steps:  Talk to your family and friends. Ask them to support and encourage you.  Call phone quitlines, reach out to support groups, or work with a Social worker.  Ask people who smoke to not smoke around you.  Avoid places that make you want (trigger) to smoke, such as: ? Bars. ? Parties. ? Smoke-break areas at work.  Spend time with people who do not smoke.  Lower the stress in your life. Stress can make you want to smoke. Try these things to help your stress: ? Getting regular exercise. ? Deep-breathing exercises. ? Yoga. ? Meditating. ? Doing a body scan. To do this, close your eyes, focus on one area of your body at a time from head to toe, and notice which parts of your body are tense. Try to relax the muscles in those areas.  Download or buy apps on your mobile phone  or tablet that can help you stick to your quit plan. There are many free apps, such as QuitGuide from the [employer] Office manager for Disease Control and Prevention). You can find more support from smokefree.gov and other websites.  This information is not intended to replace advice given to you by your health care provider. Make sure you discuss any questions you have with your health care provider. Document Released: 04/29/2009 Document Revised: 02/29/2016 Document Reviewed: 11/17/2014 Elsevier Interactive Patient Education  2018 [employer].

## 2017-10-17 NOTE — Assessment & Plan Note (Signed)
Uncontrolled with increased symptoms , start daily meds, and a void allergens as much as possible

## 2017-10-17 NOTE — Progress Notes (Signed)
   Wesley Brennan     MRN: [id number]      DOB: Dec 06, 1960   HPI Wesley Brennan is here for follow up and re-evaluation of chronic medical conditions, medication management and review of any available recent lab and radiology data.  The PT denies any adverse reactions to current medications since the last visit.  C/o itchy eyes , and sneezing  With change in season, and is requesting medication for allergies, he has no fever or chills Smokes on average 1 PPD of cigarettes and drinks 48 oz beer  ROS Denies chest congestion, productive cough or wheezing. Denies chest pains, palpitations and leg swelling Denies abdominal pain, nausea, vomiting,diarrhea or constipation.   Denies dysuria, frequency, hesitancy or incontinence. Denies joint pain, swelling and limitation in mobility. Denies headaches, seizures, numbness, or tingling. Denies depression, anxiety or insomnia. Denies skin break down or rash.   PE  BP 130/80   Pulse (!) 104   Temp 98.2 F (36.8 C) (Temporal)   Resp 16   Ht 5\' 11"  (1.803 m)   Wt 155 lb 8 oz (70.5 kg)   SpO2 98%   BMI 21.69 kg/m    Patient alert and oriented and in no cardiopulmonary distress.  HEENT: No facial asymmetry, EOMI,   oropharynx pink and moist.  Neck supple no JVD, no mass.  Chest: Clear to auscultation bilaterally.  CVS: S1, S2 no murmurs, no S3.Regular rate.  ABD: Soft non tender.   Ext: No edema  MS: Adequate ROM spine, , hips and knees.Decreased in left upper extrremity  Skin: Intact, no ulcerations or rash noted.  Psych: Good eye contact, normal affect. Memory intact not anxious or depressed appearing.  CNS: CN 2-12 intact, power,  normal throughout.no focal deficits noted.   Assessment & Plan  Allergic rhinitis Uncontrolled with increased symptoms , start daily meds, and a void allergens as much as possible  Hyperlipidemia LDL goal <100 Hyperlipidemia:Low fat diet discussed and encouraged.   Lipid Panel  Lab Results    Component Value Date   CHOL 192 04/20/2017   HDL 85 04/20/2017   LDLCALC 81 04/20/2017   TRIG 154 (H) 04/20/2017   CHOLHDL 2.3 04/20/2017    Updated lab needed .   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

## 2017-10-18 ENCOUNTER — Encounter: Payer: Self-pay | Admitting: Family Medicine

## 2017-10-18 NOTE — Assessment & Plan Note (Signed)
counsel led re need to continue to cut back on alcohol with a view to quitting altogether, reports currently drinks 48 ounces beer daily, also strongly encouraged go in to [place] meetings but not committing still, unfortunately

## 2017-10-18 NOTE — Assessment & Plan Note (Signed)
Hyperlipidemia:Low fat diet discussed and encouraged.   Lipid Panel  Lab Results  Component Value Date   CHOL 192 04/20/2017   HDL 85 04/20/2017   LDLCALC 81 04/20/2017   TRIG 154 (H) 04/20/2017   CHOLHDL 2.3 04/20/2017    Updated lab needed .

## 2017-10-19 NOTE — Assessment & Plan Note (Signed)
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

## 2017-11-14 ENCOUNTER — Ambulatory Visit: Payer: Medicare Other

## 2017-11-21 ENCOUNTER — Ambulatory Visit: Payer: Medicare Other

## 2018-01-06 ENCOUNTER — Emergency Department (HOSPITAL_COMMUNITY): Payer: Medicare Other

## 2018-01-06 ENCOUNTER — Observation Stay (HOSPITAL_COMMUNITY)
Admission: EM | Admit: 2018-01-06 | Discharge: 2018-01-07 | Disposition: A | Discharge status: Home / Self Care | Payer: Medicare Other | Attending: Internal Medicine | Admitting: Internal Medicine

## 2018-01-06 ENCOUNTER — Other Ambulatory Visit: Payer: Self-pay

## 2018-01-06 ENCOUNTER — Encounter (HOSPITAL_COMMUNITY): Payer: Self-pay | Admitting: Emergency Medicine

## 2018-01-06 DIAGNOSIS — E785 Hyperlipidemia, unspecified: Secondary | ICD-10-CM | POA: Diagnosis not present

## 2018-01-06 DIAGNOSIS — F17210 Nicotine dependence, cigarettes, uncomplicated: Secondary | ICD-10-CM | POA: Insufficient documentation

## 2018-01-06 DIAGNOSIS — M109 Gout, unspecified: Secondary | ICD-10-CM | POA: Diagnosis not present

## 2018-01-06 DIAGNOSIS — R079 Chest pain, unspecified: Secondary | ICD-10-CM

## 2018-01-06 DIAGNOSIS — Z720 Tobacco use: Secondary | ICD-10-CM | POA: Diagnosis present

## 2018-01-06 DIAGNOSIS — Z79899 Other long term (current) drug therapy: Secondary | ICD-10-CM | POA: Diagnosis not present

## 2018-01-06 DIAGNOSIS — R072 Precordial pain: Secondary | ICD-10-CM | POA: Diagnosis not present

## 2018-01-06 DIAGNOSIS — M79671 Pain in right foot: Secondary | ICD-10-CM | POA: Insufficient documentation

## 2018-01-06 DIAGNOSIS — I10 Essential (primary) hypertension: Secondary | ICD-10-CM | POA: Diagnosis not present

## 2018-01-06 LAB — BASIC METABOLIC PANEL
Anion gap: 8 (ref 5–15)
BUN: 11 mg/dL (ref 6–20)
CHLORIDE: 111 mmol/L (ref 101–111)
CO2: 22 mmol/L (ref 22–32)
Calcium: 8.6 mg/dL — ABNORMAL LOW (ref 8.9–10.3)
Creatinine, Ser: 0.91 mg/dL (ref 0.61–1.24)
GFR calc Af Amer: >60 mL/min (ref >60)
GFR calc non Af Amer: >60 mL/min (ref >60)
GLUCOSE: 99 mg/dL (ref 65–99)
Potassium: 3.7 mmol/L (ref 3.5–5.1)
Sodium: 141 mmol/L (ref 135–145)

## 2018-01-06 LAB — CBC
HEMATOCRIT: 35.8 % — AB (ref 39.0–52.0)
HEMOGLOBIN: 12 g/dL — AB (ref 13.0–17.0)
MCH: 33.9 pg (ref 26.0–34.0)
MCHC: 33.5 g/dL (ref 30.0–36.0)
MCV: 101.1 fL — ABNORMAL HIGH (ref 78.0–100.0)
Platelets: 228 10*3/uL (ref 150–400)
RBC: 3.54 MIL/uL — ABNORMAL LOW (ref 4.22–5.81)
RDW: 12.6 % (ref 11.5–15.5)
WBC: 7.2 10*3/uL (ref 4.0–10.5)

## 2018-01-06 LAB — URIC ACID: URIC ACID, SERUM: 7.5 mg/dL (ref 4.4–7.6)

## 2018-01-06 LAB — TROPONIN I

## 2018-01-06 MED ORDER — NICOTINE 14 MG/24HR TD PT24
14.0000 mg | MEDICATED_PATCH | Freq: Every day | TRANSDERMAL | Status: DC
  Filled 2018-01-06: qty 1

## 2018-01-06 MED ORDER — NITROGLYCERIN 0.4 MG SL SUBL: 0.4000 mg | SUBLINGUAL_TABLET | SUBLINGUAL | Status: DC | PRN

## 2018-01-06 MED ORDER — SODIUM CHLORIDE 0.9% FLUSH
3.0000 mL | Freq: Two times a day (BID) | INTRAVENOUS | Status: DC
  Administered 2018-01-07: 3 mL via INTRAVENOUS

## 2018-01-06 MED ORDER — ONDANSETRON HCL 4 MG/2ML IJ SOLN: 4.0000 mg | Freq: Four times a day (QID) | INTRAMUSCULAR | Status: DC | PRN

## 2018-01-06 MED ORDER — ONDANSETRON HCL 4 MG PO TABS: 4.0000 mg | ORAL_TABLET | Freq: Four times a day (QID) | ORAL | Status: DC | PRN

## 2018-01-06 MED ORDER — ASPIRIN EC 81 MG PO TBEC
81.0000 mg | DELAYED_RELEASE_TABLET | Freq: Every day | ORAL | Status: DC
  Administered 2018-01-07: 81 mg via ORAL
  Filled 2018-01-06: qty 1

## 2018-01-06 MED ORDER — MONTELUKAST SODIUM 10 MG PO TABS
10.0000 mg | ORAL_TABLET | Freq: Every day | ORAL | Status: DC
  Administered 2018-01-06: 10 mg via ORAL
  Filled 2018-01-06: qty 1

## 2018-01-06 MED ORDER — ENOXAPARIN SODIUM 40 MG/0.4ML ~~LOC~~ SOLN
40.0000 mg | SUBCUTANEOUS | Status: DC
  Administered 2018-01-06: 40 mg via SUBCUTANEOUS
  Filled 2018-01-06: qty 0.4

## 2018-01-06 MED ORDER — ACETAMINOPHEN 650 MG RE SUPP: 650.0000 mg | Freq: Four times a day (QID) | RECTAL | Status: DC | PRN

## 2018-01-06 MED ORDER — SODIUM CHLORIDE 0.9% FLUSH: 3.0000 mL | INTRAVENOUS | Status: DC | PRN

## 2018-01-06 MED ORDER — METHYLPREDNISOLONE SODIUM SUCC 40 MG IJ SOLR
20.0000 mg | Freq: Two times a day (BID) | INTRAMUSCULAR | Status: DC
  Administered 2018-01-06 – 2018-01-07 (×2): 20 mg via INTRAVENOUS
  Filled 2018-01-06 (×2): qty 1

## 2018-01-06 MED ORDER — OLOPATADINE HCL 0.1 % OP SOLN
1.0000 [drp] | Freq: Two times a day (BID) | OPHTHALMIC | Status: DC
  Administered 2018-01-07: 1 [drp] via OPHTHALMIC
  Filled 2018-01-06: qty 5

## 2018-01-06 MED ORDER — PRAVASTATIN SODIUM 40 MG PO TABS: 40.0000 mg | ORAL_TABLET | Freq: Every day | ORAL | Status: DC

## 2018-01-06 MED ORDER — SODIUM CHLORIDE 0.9 % IV SOLN: 250.0000 mL | INTRAVENOUS | Status: DC | PRN

## 2018-01-06 MED ORDER — ASPIRIN 81 MG PO CHEW
324.0000 mg | CHEWABLE_TABLET | Freq: Once | ORAL | Status: AC
  Administered 2018-01-06: 324 mg via ORAL
  Filled 2018-01-06: qty 4

## 2018-01-06 MED ORDER — ACETAMINOPHEN 325 MG PO TABS
650.0000 mg | ORAL_TABLET | Freq: Four times a day (QID) | ORAL | Status: DC | PRN
  Administered 2018-01-06 – 2018-01-07 (×2): 650 mg via ORAL
  Filled 2018-01-06 (×2): qty 2

## 2018-01-06 NOTE — ED Triage Notes (Signed)
Patient c/o left-sided, non-radiating chest pain x3-4 days. Denies any shortness of breath, nausea, vomiting, or dizziness. Patient does report generalized weakness. Denies any cardiac hx or swelling. Patient also c/o right foot pain-per patient "gout pain."

## 2018-01-06 NOTE — H&P (Addendum)
History and Physical    Wesley Brennan HYQ:[id number] DOB: [birth year] DOA: 01/06/2018  PCP: Wesley Helper, MD   Patient coming from: Home  Chief Complaint: Chest pain  HPI: Wesley Brennan is a 57 y.o. male with medical history significant for hypertension, dyslipidemia, gout, alcohol abuse, and ongoing tobacco abuse who presents to the emergency department with constant chest pain that began approximately 2 days ago.  He describes his pain as substernal and sharp in nature with some intermittent radiation to both upper extremities.  He denies any aggravating or alleviating factors and states that at times he notices that his pain is worse when he is riding his bike.  He has no prior history of CAD and has questionable compliance with his home blood pressure medications for several months.   He also describes pain to his right foot that began at approximately the same time and he has had difficulty with ambulation.  He states that he is able to wear his socks but putting on shoes because of significant pain.  He feels that his pain is similar to prior gout episodes that he has had in the past. He denies any shortness of breath, lower extremity edema, palpitations, nausea, vomiting, or diaphoresis.  He states that his father has a significant history of heart attacks that began in his 97s.   ED Course: Vital signs are currently stable and laboratory data is unremarkable.  Two-view chest x-ray with no acute findings.  Right foot x-ray with no acute findings.  EKG with sinus rhythm at 88 bpm and no ischemic changes.  Initial troponin at 0.03.  He has been given full dose aspirin.  Review of Systems: All others reviewed and otherwise negative.  Past Medical History:  Diagnosis Date  . Hypercholesteremia   . Hypertension    related to drug use which he has quit  . Substance abuse ([place])    alcohol, nicotine, h/o coacaine and marijuana    Past Surgical History:  Procedure Laterality Date  .  COLONOSCOPY N/A 07/02/2015   Procedure: COLONOSCOPY;  Surgeon: Wesley Binder, MD;  Location: [facility];  Service: Endoscopy;  Laterality: N/A;  1:00 PM  . ELBOW FUSION  1993   left elbow dislocation  . ELBOW FUSION Left 1979  . HERNIA REPAIR  9528 approx   umbilical hernia     reports that he has been smoking cigarettes.  He has a 10.00 pack-year smoking history. He has never used smokeless tobacco. He reports that he drinks about 1.2 oz of alcohol per week. He reports that he does not use drugs.  No Known Allergies  Family History  Adopted: Yes  Problem Relation Age of Onset  . Cancer Mother 61       Bone   . Diabetes Mother   . Hypertension Sister   . Stroke Father 31  . Heart disease Father 39  . Diabetes Father   . Colon cancer Neg Hx     Prior to Admission medications   Medication Sig Start Date End Date Taking? Authorizing Provider  acetaminophen (TYLENOL) 500 MG tablet Take 1 tablet (500 mg total) by mouth every 8 (eight) hours as needed. 12/13/16   Wesley Helper, MD  indomethacin (INDOCIN) 50 MG capsule Take 1 capsule (50 mg total) by mouth 2 (two) times daily with a meal. FOR GOUT Patient not taking: Reported on 10/17/2017 05/26/16   Raylene Everts, MD  meloxicam (MOBIC) 15 MG tablet Take 1 tablet (  15 mg total) by mouth daily. Patient not taking: Reported on 10/17/2017 12/13/16   Wesley Helper, MD  montelukast (SINGULAIR) 10 MG tablet Take 1 tablet (10 mg total) by mouth at bedtime. 10/17/17   Wesley Helper, MD  naproxen (NAPROSYN) 500 MG tablet Take one tablet twice a day with food at first sign of GOUT Patient not taking: Reported on 10/17/2017 05/29/16   Raylene Everts, MD  olopatadine (PATANOL) 0.1 % ophthalmic solution Place 1 drop into both eyes 2 (two) times daily. 10/17/17   Wesley Helper, MD  pravastatin (PRAVACHOL) 40 MG tablet Take 1 tablet (40 mg total) by mouth daily. 10/17/17   Wesley Helper, MD    Physical Exam: Vitals:    01/06/18 1818 01/06/18 1819  BP:  136/85  Pulse:  92  Resp:  18  Temp:  98.1 F (36.7 C)  TempSrc:  Oral  SpO2:  98%  Weight: 72.6 kg (160 lb)   Height: 5\' 10"  (1.778 m)     Constitutional: NAD, calm, comfortable Vitals:   01/06/18 1818 01/06/18 1819  BP:  136/85  Pulse:  92  Resp:  18  Temp:  98.1 F (36.7 C)  TempSrc:  Oral  SpO2:  98%  Weight: 72.6 kg (160 lb)   Height: 5\' 10"  (1.778 m)    Eyes: lids and conjunctivae normal ENMT: Mucous membranes are moist.  Neck: normal, supple Respiratory: clear to auscultation bilaterally. Normal respiratory effort. No accessory muscle use.  Cardiovascular: Regular rate and rhythm, no murmurs. No extremity edema. Abdomen: no tenderness, no distention. Bowel sounds positive.  Musculoskeletal:  No joint deformity upper and lower extremities.  Right foot dorsal lateral tenderness with some swelling noted. Skin: no rashes, lesions, ulcers.  Psychiatric: Normal judgment and insight. Alert and oriented x 3. Normal mood.   Labs on Admission: I have personally reviewed following labs and imaging studies  CBC: Recent Labs  Lab 01/06/18 1835  WBC 7.2  HGB 12.0*  HCT 35.8*  MCV 101.1*  PLT 628   Basic Metabolic Panel: Recent Labs  Lab 01/06/18 1835  NA 141  K 3.7  CL 111  CO2 22  GLUCOSE 99  BUN 11  CREATININE 0.91  CALCIUM 8.6*   GFR: Estimated Creatinine Clearance: 89.8 mL/min (by C-G formula based on SCr of 0.91 mg/dL). Liver Function Tests: No results for input(s): AST, ALT, ALKPHOS, BILITOT, PROT, ALBUMIN in the last 168 hours. No results for input(s): LIPASE, AMYLASE in the last 168 hours. No results for input(s): AMMONIA in the last 168 hours. Coagulation Profile: No results for input(s): INR, PROTIME in the last 168 hours. Cardiac Enzymes: Recent Labs  Lab 01/06/18 1835  TROPONINI <0.03   BNP (last 3 results) No results for input(s): PROBNP in the last 8760 hours. HbA1C: No results for input(s): HGBA1C  in the last 72 hours. CBG: No results for input(s): GLUCAP in the last 168 hours. Lipid Profile: No results for input(s): CHOL, HDL, LDLCALC, TRIG, CHOLHDL, LDLDIRECT in the last 72 hours. Thyroid Function Tests: No results for input(s): TSH, T4TOTAL, FREET4, T3FREE, THYROIDAB in the last 72 hours. Anemia Panel: No results for input(s): VITAMINB12, FOLATE, FERRITIN, TIBC, IRON, RETICCTPCT in the last 72 hours. Urine analysis: No results found for: COLORURINE, APPEARANCEUR, LABSPEC, PHURINE, GLUCOSEU, HGBUR, BILIRUBINUR, KETONESUR, PROTEINUR, UROBILINOGEN, NITRITE, LEUKOCYTESUR  Radiological Exams on Admission: Dg Chest 2 View  Result Date: 01/06/2018 CLINICAL DATA:  Left-sided nonradiating chest pain for 3-4 days. EXAM: CHEST -  2 VIEW COMPARISON:  01/16/2015 FINDINGS: Heart size is normal. Mild ectasia of the thoracic aorta without aneurysm. Clear lungs. No acute osseous abnormality. IMPRESSION: No active cardiopulmonary disease. Electronically Signed   By: Ashley Royalty M.D.   On: 01/06/2018 19:20   Dg Foot Complete Right  Result Date: 01/06/2018 CLINICAL DATA:  Right foot pain, question gout EXAM: RIGHT FOOT COMPLETE - 3+ VIEW COMPARISON:  06/18/2013 FINDINGS: There is no evidence of fracture or dislocation. Plantar and dorsal calcaneal enthesopathy. There is no evidence of arthropathy or other focal bone abnormality. No soft tissue tophus nor extra articular erosive change. IMPRESSION: Calcaneal enthesophytes.  No acute osseous abnormality. Electronically Signed   By: Ashley Royalty M.D.   On: 01/06/2018 19:19    EKG: Independently reviewed. SR at 88bpm. No signs of ischemic changes noted.  Assessment/Plan Principal Problem:   Chest pain Active Problems:   Hyperlipidemia LDL goal <100   Tobacco use   Essential hypertension   Gout    1. Atypical chest pain.  Patient does have heart score of 4 and has concerning risk factors.  Will trend troponins with repeat EKG in a.m. and monitor on  telemetry and maintain on aspirin for now.  Nitroglycerin as needed for chest pain.  N.p.o. after midnight for stress test in a.m. Requested. 2. Right foot acute gout flare.  Start on IV methylprednisolone and check uric acid level.  May transition to steroid taper on discharge and initiate allopurinol once flare resolves. Hold home nsaids for now. 3. Hypertension.  Currently controlled the patient states that he was previously prescribed antihypertensives, but I do not see this prescribed previously.  Continue to monitor. 4. Dyslipidemia.  Maintain on statin. 5. Tobacco abuse.  Counseled on cessation.  Nicotine patch. 6. Alcohol abuse.  Drinks one  24 ounce beer on a daily basis.  Counseled on cessation as this contributes to gout.  Monitor for signs of agitation which he currently does not have.   DVT prophylaxis: Lovenox Code Status: Full Family Communication: None currently at bedside Disposition Plan:Evaluation for ACS with stress test Consults called:Cardiology for stress in am Admission status: Obs, tele   Riah Kehoe Darleen Crocker DO [facility] Pager [phone]  If 7PM-7AM, please contact night-coverage [email or web address] Password [facility]  01/06/2018, 8:01 PM

## 2018-01-06 NOTE — ED Provider Notes (Signed)
Emergency Department Provider Note   I have reviewed the triage vital signs and the nursing notes.   HISTORY  Chief Complaint Chest Pain   HPI Wesley Brennan is a 57 y.o. male with PMH of HLD, HTN, and tobacco use presents to the emergency department for evaluation of intermittent chest pain over the past 2 days with associated right foot pain.  Patient states that his right foot began hurting and feels similar to gout.  Denies fevers.  Denies any injury to the foot.  In terms of the patient's chest discomfort he describes it as sharp, moderate pain which is nonradiating and centered over the left side of his chest.  He states he is active including riding a bike and mowing the grass but has not had worsening pain during these episodes.  No other modifying factors.  Denies dyspnea.  No prior history of coronary artery disease.  Denies fevers, productive cough, hemoptysis.   Past Medical History:  Diagnosis Date  . Hypercholesteremia   . Hypertension    related to drug use which he has quit  . Substance abuse ([place])    alcohol, nicotine, h/o coacaine and marijuana    Patient Active Problem List   Diagnosis Date Noted  . Essential hypertension 01/06/2018  . Gout 01/06/2018  . Chest pain 01/06/2018  . Tobacco use 12/13/2016  . Left knee pain 12/13/2016  . Tubular adenoma of colon 07/08/2015  . Vision loss, bilateral 04/27/2014  . Allergic rhinitis 12/15/2013  . Hyperlipidemia LDL goal <100 06/26/2008  . Alcohol dependence ([place]) 06/26/2008  . NICOTINE ADDICTION 11/27/2007  . MENTAL RETARDATION 11/27/2007    Past Surgical History:  Procedure Laterality Date  . COLONOSCOPY N/A 07/02/2015   Procedure: COLONOSCOPY;  Surgeon: Danie Binder, MD;  Location: [facility];  Service: Endoscopy;  Laterality: N/A;  1:00 PM  . ELBOW FUSION  1993   left elbow dislocation  . ELBOW FUSION Left 1979  . HERNIA REPAIR  9702 approx   umbilical hernia    Allergies Patient has no  known allergies.  Family History  Adopted: Yes  Problem Relation Age of Onset  . Cancer Mother 23       Bone   . Diabetes Mother   . Hypertension Sister   . Stroke Father 56  . Heart disease Father 45  . Diabetes Father   . Colon cancer Neg Hx     Social History Social History   Tobacco Use  . Smoking status: Current Every Day Smoker    Packs/day: 0.25    Years: 40.00    Pack years: 10.00    Types: Cigarettes  . Smokeless tobacco: Never Used  . Tobacco comment: 1/2 pk per day   Substance Use Topics  . Alcohol use: Yes    Alcohol/week: 1.2 oz    Types: 2 Cans of beer per week    Comment: 80 oz everyday  . Drug use: No    Review of Systems  Constitutional: No fever/chills Eyes: No visual changes. ENT: No sore throat. Cardiovascular: Positive chest pain. Respiratory: Denies shortness of breath. Gastrointestinal: No abdominal pain.  No nausea, no vomiting.  No diarrhea.  No constipation. Genitourinary: Negative for dysuria. Musculoskeletal: Negative for back pain. Positive right lateral foot pain, swelling, and redness.  Skin: Negative for rash. Neurological: Negative for headaches, focal weakness or numbness.  10-point ROS otherwise negative.  ____________________________________________   PHYSICAL EXAM:  VITAL SIGNS: ED Triage Vitals  Enc Vitals Group  BP 01/06/18 1819 136/85     Pulse Rate 01/06/18 1819 92     Resp 01/06/18 1819 18     Temp 01/06/18 1819 98.1 F (36.7 C)     Temp Source 01/06/18 1819 Oral     SpO2 01/06/18 1819 98 %     Weight 01/06/18 1818 160 lb (72.6 kg)     Height 01/06/18 1818 5\' 10"  (1.778 m)     Pain Score 01/06/18 1818 10   Constitutional: Alert and oriented. Well appearing and in no acute distress. Eyes: Conjunctivae are normal.  Head: Atraumatic. Nose: No congestion/rhinnorhea. Mouth/Throat: Mucous membranes are moist.  Neck: No stridor.  Cardiovascular: Normal rate, regular rhythm. Good peripheral circulation.  Grossly normal heart sounds.   Respiratory: Normal respiratory effort.  No retractions. Lungs CTAB. Gastrointestinal: Soft and nontender. No distention.  Musculoskeletal: No lower extremity tenderness nor edema. No gross deformities of extremities. Mild right lateral foot swelling over the distal metatarsal. Mild overlying erythema. No induration, fluctuance, or warmth to touch.  Neurologic:  Normal speech and language. No gross focal neurologic deficits are appreciated.  Skin:  Skin is warm, dry and intact. No rash noted.  ____________________________________________   LABS (all labs ordered are listed, but only abnormal results are displayed)  Labs Reviewed  BASIC METABOLIC PANEL - Abnormal; Notable for the following components:      Result Value   Calcium 8.6 (*)    All other components within normal limits  CBC - Abnormal; Notable for the following components:   RBC 3.54 (*)    Hemoglobin 12.0 (*)    HCT 35.8 (*)    MCV 101.1 (*)    All other components within normal limits  TROPONIN I   ____________________________________________  EKG   EKG Interpretation  Date/Time:  Sunday January 06 2018 18:23:51 EDT Ventricular Rate:  88 PR Interval:    QRS Duration: 103 QT Interval:  391 QTC Calculation: 474 R Axis:   42 Text Interpretation:  Sinus rhythm Probable anteroseptal infarct, old Nonspecific T abnormalities, lateral leads No STEMI.  Confirmed by Nanda Quinton [phone]) on 01/06/2018 6:39:39 PM       ____________________________________________  RADIOLOGY  Dg Chest 2 View  Result Date: 01/06/2018 CLINICAL DATA:  Left-sided nonradiating chest pain for 3-4 days. EXAM: CHEST - 2 VIEW COMPARISON:  01/16/2015 FINDINGS: Heart size is normal. Mild ectasia of the thoracic aorta without aneurysm. Clear lungs. No acute osseous abnormality. IMPRESSION: No active cardiopulmonary disease. Electronically Signed   By: Ashley Royalty M.D.   On: 01/06/2018 19:20   Dg Foot Complete  Right  Result Date: 01/06/2018 CLINICAL DATA:  Right foot pain, question gout EXAM: RIGHT FOOT COMPLETE - 3+ VIEW COMPARISON:  06/18/2013 FINDINGS: There is no evidence of fracture or dislocation. Plantar and dorsal calcaneal enthesopathy. There is no evidence of arthropathy or other focal bone abnormality. No soft tissue tophus nor extra articular erosive change. IMPRESSION: Calcaneal enthesophytes.  No acute osseous abnormality. Electronically Signed   By: Ashley Royalty M.D.   On: 01/06/2018 19:19    ____________________________________________   PROCEDURES  Procedure(s) performed:   Procedures  None  ____________________________________________   INITIAL IMPRESSION / ASSESSMENT AND PLAN / ED COURSE  Pertinent labs & imaging results that were available during my care of the patient were reviewed by me and considered in my medical decision making (see chart for details).  Patient presents to the emergency department with chest pain which is sharp and somewhat atypical but intermittent  over the past 2 days.  His EKG is reviewed with no acute findings.  Patient has several risk factors for cardiovascular disease including hypertension, hyperlipidemia, smoking history, and family history.  The patient's right foot pain is mildly tender to touch with no concern for septic arthritis.  Patient reports he has had gout flares in this location in the past. Plan for plain film to r/o acute fracture but low suspicion for this clinically. HEART score 4 (EKG 1, Age 81, and Risk factors 2).  Labs reviewed without acute finding. Normal CXR. Nonspecific ST changes on EKG. Patient is pain free at this time. Has a distant history of stress test but no cath or PCI history. Foot x-ray unremarkable. Plan to treat the foot with NSAIDs as if gout.   Discussed patient's case with Hospitalist, Dr. Manuella Ghazi to request admission. Patient and family (if present) updated with plan. Care transferred to [facility]  service.  I reviewed all nursing notes, vitals, pertinent old records, EKGs, labs, imaging (as available).  ____________________________________________  FINAL CLINICAL IMPRESSION(S) / ED DIAGNOSES  Final diagnoses:  Precordial chest pain  Right foot pain    MEDICATIONS GIVEN DURING THIS VISIT:  Medications  aspirin chewable tablet 324 mg (324 mg Oral Given 01/06/18 1908)    Note:  This document was prepared using Dragon voice recognition software and may include unintentional dictation errors.  Nanda Quinton, MD Emergency Medicine    Emelin Dascenzo, [facility], MD 01/06/18 [phone]

## 2018-01-07 ENCOUNTER — Encounter (HOSPITAL_COMMUNITY): Payer: Self-pay | Admitting: *Deleted

## 2018-01-07 ENCOUNTER — Observation Stay (HOSPITAL_BASED_OUTPATIENT_CLINIC_OR_DEPARTMENT_OTHER): Payer: Medicare Other

## 2018-01-07 DIAGNOSIS — R079 Chest pain, unspecified: Secondary | ICD-10-CM

## 2018-01-07 DIAGNOSIS — M109 Gout, unspecified: Secondary | ICD-10-CM | POA: Diagnosis not present

## 2018-01-07 DIAGNOSIS — I10 Essential (primary) hypertension: Secondary | ICD-10-CM

## 2018-01-07 DIAGNOSIS — E785 Hyperlipidemia, unspecified: Secondary | ICD-10-CM

## 2018-01-07 DIAGNOSIS — M79671 Pain in right foot: Secondary | ICD-10-CM | POA: Diagnosis not present

## 2018-01-07 DIAGNOSIS — F17210 Nicotine dependence, cigarettes, uncomplicated: Secondary | ICD-10-CM | POA: Diagnosis not present

## 2018-01-07 DIAGNOSIS — R072 Precordial pain: Secondary | ICD-10-CM | POA: Diagnosis not present

## 2018-01-07 DIAGNOSIS — Z79899 Other long term (current) drug therapy: Secondary | ICD-10-CM | POA: Diagnosis not present

## 2018-01-07 LAB — CBC
HEMATOCRIT: 34.4 % — AB (ref 39.0–52.0)
Hemoglobin: 11.4 g/dL — ABNORMAL LOW (ref 13.0–17.0)
MCH: 33.8 pg (ref 26.0–34.0)
MCHC: 33.1 g/dL (ref 30.0–36.0)
MCV: 102.1 fL — ABNORMAL HIGH (ref 78.0–100.0)
Platelets: 232 10*3/uL (ref 150–400)
RBC: 3.37 MIL/uL — ABNORMAL LOW (ref 4.22–5.81)
RDW: 12.7 % (ref 11.5–15.5)
WBC: 4.1 10*3/uL (ref 4.0–10.5)

## 2018-01-07 LAB — NM MYOCAR MULTI W/SPECT W/WALL MOTION / EF
CHL CUP MPHR: 161 {beats}/min
CHL RATE OF PERCEIVED EXERTION: 12
CSEPEDS: 16 s
Estimated workload: 10.1 METS
Exercise duration (min): 7 min
LHR: 0.28
LVDIAVOL: 176 mL (ref 62–150)
LVSYSVOL: 107 mL
Peak HR: 181 {beats}/min
Percent HR: 112 %
Rest HR: 60 {beats}/min
SDS: 1
SRS: 7
SSS: 8
TID: 1.11

## 2018-01-07 LAB — BASIC METABOLIC PANEL
Anion gap: 8 (ref 5–15)
BUN: 10 mg/dL (ref 6–20)
CO2: 22 mmol/L (ref 22–32)
Calcium: 8.4 mg/dL — ABNORMAL LOW (ref 8.9–10.3)
Chloride: 109 mmol/L (ref 101–111)
Creatinine, Ser: 0.72 mg/dL (ref 0.61–1.24)
GFR calc Af Amer: >60 mL/min (ref >60)
GLUCOSE: 107 mg/dL — AB (ref 65–99)
Potassium: 3.8 mmol/L (ref 3.5–5.1)
Sodium: 139 mmol/L (ref 135–145)

## 2018-01-07 LAB — ECHOCARDIOGRAM COMPLETE
HEIGHTINCHES: 71 in
Weight: 2349.22 oz

## 2018-01-07 LAB — TROPONIN I: Troponin I: <0.03 ng/mL (ref <0.03)

## 2018-01-07 MED ORDER — TECHNETIUM TC 99M TETROFOSMIN IV KIT
10.0000 | PACK | Freq: Once | INTRAVENOUS | Status: AC | PRN
  Administered 2018-01-07: 9.5 via INTRAVENOUS

## 2018-01-07 MED ORDER — REGADENOSON 0.4 MG/5ML IV SOLN
INTRAVENOUS | Status: AC
  Filled 2018-01-07: qty 5

## 2018-01-07 MED ORDER — TECHNETIUM TC 99M TETROFOSMIN IV KIT
30.0000 | PACK | Freq: Once | INTRAVENOUS | Status: AC | PRN
  Administered 2018-01-07: 30 via INTRAVENOUS

## 2018-01-07 MED ORDER — PREDNISONE 10 MG PO TABS: ORAL_TABLET | ORAL | 0 refills | Status: DC

## 2018-01-07 MED ORDER — SODIUM CHLORIDE 0.9% FLUSH
INTRAVENOUS | Status: AC
  Administered 2018-01-07: 10 mL via INTRAVENOUS
  Filled 2018-01-07: qty 10

## 2018-01-07 MED ORDER — METOPROLOL TARTRATE 25 MG PO TABS: 25.0000 mg | ORAL_TABLET | Freq: Two times a day (BID) | ORAL | 0 refills | Status: DC

## 2018-01-07 MED ORDER — METOPROLOL TARTRATE 25 MG PO TABS
25.0000 mg | ORAL_TABLET | Freq: Two times a day (BID) | ORAL | Status: DC
  Administered 2018-01-07: 25 mg via ORAL
  Filled 2018-01-07: qty 1

## 2018-01-07 NOTE — Discharge Summary (Signed)
Physician Discharge Summary  Wesley Brennan XBJ:[id number] DOB: 05/29/1961 DOA: 01/06/2018  PCP: Fayrene Helper, MD  Admit date: 01/06/2018 Discharge date: 01/07/2018  Admitted From: Home Disposition: Home  Recommendations for Outpatient Follow-up:  1. Follow up with PCP in 1-2 weeks 2. Please obtain BMP/CBC in one week 3. Follow-up with cardiology will be arranged.  Home Health: Equipment/Devices:  Discharge Condition: Stable CODE STATUS: Full code Diet recommendation: Heart healthy  Brief/Interim Summary: 57 year old male with history of hypertension, diabetes, gout, alcohol use, presents to the hospital with complaints of chest discomfort.  Symptoms were somewhat atypical, but he was noted to have nonspecific changes on EKG.  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  Echocardiogram was performed that showed LVEF of 50% with no wall motion abnormalities.  He was felt stable from a cardiac standpoint to discharge and have outpatient follow-up.  He was not having any further chest pain.  He did have significant pain in his right foot, near his right ankle.  This is felt to be related to a gout flare.  He was started on some intravenous steroids and had significant improvement of his symptoms.  We placed on a prednisone taper.  He is been advised that his alcohol consumption can precipitate an acute gouty attack.  This can be further followed up by his primary care physician.  Patient is otherwise feeling better and feels ready for discharge home.  Discharge Diagnoses:  Principal Problem:   Chest pain Active Problems:   Hyperlipidemia LDL goal <100   Tobacco use   Essential hypertension   Gout    Discharge Instructions  Discharge Instructions    Diet - low sodium heart healthy   Complete by:  As directed    Increase activity slowly   Complete by:  As directed      Allergies as of 01/07/2018   No Known  Allergies     Medication List    TAKE these medications   aspirin EC 81 MG tablet Take 81 mg by mouth daily.   metoprolol tartrate 25 MG tablet Commonly known as:  LOPRESSOR Take 1 tablet (25 mg total) by mouth 2 (two) times daily.   montelukast 10 MG tablet Commonly known as:  SINGULAIR Take 1 tablet (10 mg total) by mouth at bedtime.   pravastatin 40 MG tablet Commonly known as:  PRAVACHOL Take 1 tablet (40 mg total) by mouth daily.   predniSONE 10 MG tablet Commonly known as:  DELTASONE Take 40mg  po daily for 2 days then 30mg  daily for 2 days then 20mg  daily for 2 days then 10mg  daily for 2 days then stop       No Known Allergies  Consultations:  Cardiology   Procedures/Studies: Dg Chest 2 View  Result Date: 01/06/2018 CLINICAL DATA:  Left-sided nonradiating chest pain for 3-4 days. EXAM: CHEST - 2 VIEW COMPARISON:  01/16/2015 FINDINGS: Heart size is normal. Mild ectasia of the thoracic aorta without aneurysm. Clear lungs. No acute osseous abnormality. IMPRESSION: No active cardiopulmonary disease. Electronically Signed   By: Ashley Royalty M.D.   On: 01/06/2018 19:20   Nm Myocar Multi W/spect W/wall Motion / Ef  Result Date: 01/07/2018  Blood pressure demonstrated a hypertensive response to exercise. Exercise stopped due to high bp's, peak exercise does not reflect patients exercise capacity.  There was no ST segment deviation noted during stress.  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 There is  adjacent gut radiotracer uptake in this area, however the wall motion appears hypokinetic suggesting true prior infarct. No current ischemia  This is a high risk study. High risk based on decreased LVEF. There is no current myocardium at jeopardy. Recommend correlating with echo.  The left ventricular ejection fraction is moderately decreased (34%).    Dg Foot Complete Right  Result Date: 01/06/2018 CLINICAL DATA:  Right foot pain,  question gout EXAM: RIGHT FOOT COMPLETE - 3+ VIEW COMPARISON:  06/18/2013 FINDINGS: There is no evidence of fracture or dislocation. Plantar and dorsal calcaneal enthesopathy. There is no evidence of arthropathy or other focal bone abnormality. No soft tissue tophus nor extra articular erosive change. IMPRESSION: Calcaneal enthesophytes.  No acute osseous abnormality. Electronically Signed   By: Ashley Royalty M.D.   On: 01/06/2018 19:19       Subjective: Right foot pain is better.  Now able to ambulate.  No further chest pain.  Discharge Exam: Vitals:   01/06/18 2125 01/07/18 0545  BP: (!) 147/90 129/70  Pulse: 75 80  Resp: 17 15  Temp: 97.7 F (36.5 C) 98.6 F (37 C)  SpO2: 99% 100%   Vitals:   01/06/18 2030 01/06/18 2100 01/06/18 2125 01/07/18 0545  BP: (!) 142/95 (!) 135/105 (!) 147/90 129/70  Pulse: 80  75 80  Resp: 15 18 17 15   Temp:   97.7 F (36.5 C) 98.6 F (37 C)  TempSrc:   Oral   SpO2: 100%  99% 100%  Weight:   66.6 kg (146 lb 13.2 oz)   Height:   5\' 11"  (1.803 m)     General: Pt is alert, awake, not in acute distress Cardiovascular: RRR, S1/S2 +, no rubs, no gallops Respiratory: CTA bilaterally, no wheezing, no rhonchi Abdominal: Soft, NT, ND, bowel sounds + Extremities: no edema, no cyanosis    The results of significant diagnostics from this hospitalization (including imaging, microbiology, ancillary and laboratory) are listed below for reference.     Microbiology: No results found for this or any previous visit (from the past 240 hour(s)).   Labs: BNP (last 3 results) No results for input(s): BNP in the last 8760 hours. Basic Metabolic Panel: Recent Labs  Lab 01/06/18 1835 01/07/18 0109  NA 141 139  K 3.7 3.8  CL 111 109  CO2 22 22  GLUCOSE 99 107*  BUN 11 10  CREATININE 0.91 0.72  CALCIUM 8.6* 8.4*   Liver Function Tests: No results for input(s): AST, ALT, ALKPHOS, BILITOT, PROT, ALBUMIN in the last 168 hours. No results for input(s):  LIPASE, AMYLASE in the last 168 hours. No results for input(s): AMMONIA in the last 168 hours. CBC: Recent Labs  Lab 01/06/18 1835 01/07/18 0109  WBC 7.2 4.1  HGB 12.0* 11.4*  HCT 35.8* 34.4*  MCV 101.1* 102.1*  PLT 228 232   Cardiac Enzymes: Recent Labs  Lab 01/06/18 1835 01/07/18 0109 01/07/18 0639 01/07/18 1402  TROPONINI <0.03 <0.03 <0.03 <0.03   BNP: Invalid input(s): POCBNP CBG: No results for input(s): GLUCAP in the last 168 hours. D-Dimer No results for input(s): DDIMER in the last 72 hours. Hgb A1c No results for input(s): HGBA1C in the last 72 hours. Lipid Profile No results for input(s): CHOL, HDL, LDLCALC, TRIG, CHOLHDL, LDLDIRECT in the last 72 hours. Thyroid function studies No results for input(s): TSH, T4TOTAL, T3FREE, THYROIDAB in the last 72 hours.  Invalid input(s): FREET3 Anemia work up No results for input(s): VITAMINB12, FOLATE, FERRITIN, TIBC, IRON, RETICCTPCT  in the last 72 hours. Urinalysis No results found for: COLORURINE, APPEARANCEUR, LABSPEC, [place], GLUCOSEU, HGBUR, BILIRUBINUR, KETONESUR, PROTEINUR, UROBILINOGEN, NITRITE, LEUKOCYTESUR Sepsis Labs Invalid input(s): PROCALCITONIN,  WBC,  LACTICIDVEN Microbiology No results found for this or any previous visit (from the past 240 hour(s)).   Time coordinating discharge: 84mins  SIGNED:   Kathie Dike, MD  [facility] 01/07/2018, 2:56 PM Pager   If 7PM-7AM, please contact night-coverage [email or web address] Password [email or web address]

## 2018-01-07 NOTE — Progress Notes (Signed)
Pt discharged home today per Dr. Roderic Palau. Pt's IV site D/C'd and WDL. Pt's VSS. Pt provided with home medication list, discharge instructions and prescriptions. Verbalized understanding. Pt ambulated off floor in stable condition accompanied by NT.

## 2018-01-07 NOTE — Progress Notes (Signed)
*  PRELIMINARY RESULTS* Echocardiogram 2D Echocardiogram has been performed.  Leavy Cella 01/07/2018, 2:20 PM

## 2018-01-07 NOTE — Progress Notes (Addendum)
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 Needs echo to better evaluate LV function and possible WMAs. If normal echo would plan for discharge today. If LV dysfunction may require further testing   Zandra Abts MD   230pm addendum Echo shows low normal LVEF 50%, no WMAs. [place] for discharge from cardiac standpoint, we will arrange outaptient f/u   Zandra Abts MD

## 2018-01-07 NOTE — Consult Note (Addendum)
Cardiology Consultation:   Patient ID: LEMONT SITZMANN; [id number]; 02-09-1961   Admit date: 01/06/2018 Date of Consult: 01/07/2018  Primary Care Provider: Fayrene Helper, MD Primary Cardiologist: New Dr. Harl Bowie Primary Electrophysiologist: N/A   Patient Profile:   RANALD ALESSIO is a 57 y.o. male with a hx of hypertension and HLD who is being seen today for the evaluation of chest pain at the request of  Dr. Manuella Ghazi.  History of Present Illness:   Mr. Sookram is a 57 year old male patient with history of hypertension, hyperlipidemia, tobacco abuse and alcohol (one 24 ounce beer daily) was admitted yesterday with atypical chest pain and acute gout flare.  Troponins are negative x3 EKG normal sinus rhythm with T wave inversion anteriorly new today-4am, compared to yesterday in 2016.  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 Denies any exertional chest pain. Complains of sharp shooting left sided chest pain off/on-no radiation, shortness of breath, diaphoresis, dizziness or presyncope. He says this am when laying on his left side he had chest pressure & telemetry showed sinus tachycardia at 140/m. He says he turned over and chest pain went away.Mother had CHF but died of bone CA. Father had MI at 88.   Past Medical History:  Diagnosis Date  . Hypercholesteremia   . Hypertension    related to drug use which he has quit  . Substance abuse ([place])    alcohol, nicotine, h/o coacaine and marijuana    Past Surgical History:  Procedure Laterality Date  . COLONOSCOPY N/A 07/02/2015   Procedure: COLONOSCOPY;  Surgeon: Danie Binder, MD;  Location: [facility];  Service: Endoscopy;  Laterality: N/A;  1:00 PM  . ELBOW FUSION  1993   left elbow dislocation  . ELBOW FUSION Left 1979  . HERNIA REPAIR  3474 approx   umbilical hernia     Home Medications:  Prior to Admission medications   Medication Sig Start Date End Date Taking? Authorizing Provider  aspirin EC 81 MG tablet Take 81 mg  by mouth daily.   Yes [provider]  montelukast (SINGULAIR) 10 MG tablet Take 1 tablet (10 mg total) by mouth at bedtime. 10/17/17  Yes Fayrene Helper, MD  pravastatin (PRAVACHOL) 40 MG tablet Take 1 tablet (40 mg total) by mouth daily. 10/17/17  Yes Fayrene Helper, MD    Inpatient Medications: Scheduled Meds: . aspirin EC  81 mg Oral Daily  . enoxaparin (LOVENOX) injection  40 mg Subcutaneous Q24H  . methylPREDNISolone (SOLU-MEDROL) injection  20 mg Intravenous Q12H  . montelukast  10 mg Oral QHS  . nicotine  14 mg Transdermal Daily  . olopatadine  1 drop Both Eyes BID  . pravastatin  40 mg Oral q1800  . sodium chloride flush  3 mL Intravenous Q12H   Continuous Infusions: . sodium chloride     PRN Meds: sodium chloride, acetaminophen **OR** acetaminophen, nitroGLYCERIN, ondansetron **OR** ondansetron (ZOFRAN) IV, sodium chloride flush  Allergies:   No Known Allergies  Social History:   Social History   Socioeconomic History  . Marital status: Single    Spouse name: Not on file  . Number of children: Not on file  . Years of education: Not on file  . Highest education level: Not on file  Occupational History  . Not on file  Social Needs  . Financial resource strain: Not on file  . Food insecurity:    Worry: Not on file    Inability: Not on file  .  Transportation needs:    Medical: Not on file    Non-medical: Not on file  Tobacco Use  . Smoking status: Current Every Day Smoker    Packs/day: 0.25    Years: 40.00    Pack years: 10.00    Types: Cigarettes  . Smokeless tobacco: Never Used  . Tobacco comment: 1/2 pk per day   Substance and Sexual Activity  . Alcohol use: Yes    Alcohol/week: 1.2 oz    Types: 2 Cans of beer per week    Comment: 80 oz everyday  . Drug use: No  . Sexual activity: Yes    Birth control/protection: Condom  Lifestyle  . Physical activity:    Days per week: Not on file    Minutes per session: Not on file  . Stress:  Not on file  Relationships  . Social connections:    Talks on phone: Not on file    Gets together: Not on file    Attends religious service: Not on file    Active member of club or organization: Not on file    Attends meetings of clubs or organizations: Not on file    Relationship status: Not on file  . Intimate partner violence:    Fear of current or ex partner: Not on file    Emotionally abused: Not on file    Physically abused: Not on file    Forced sexual activity: Not on file  Other Topics Concern  . Not on file  Social History Narrative  . Not on file    Family History:   Family History  Adopted: Yes  Problem Relation Age of Onset  . Cancer Mother 89       Bone   . Diabetes Mother   . Hypertension Sister   . Stroke Father 70  . Heart disease Father 9  . Diabetes Father   . Colon cancer Neg Hx      ROS:  Please see the history of present illness.  Review of Systems  Constitution: Negative.  HENT: Negative.   Cardiovascular: Positive for chest pain.  Respiratory: Negative.   Endocrine: Negative.   Hematologic/Lymphatic: Negative.   Musculoskeletal: Positive for gout and joint pain.  Gastrointestinal: Negative.   Genitourinary: Negative.   Neurological: Negative.     All other ROS reviewed and negative.     Physical Exam/Data:   Vitals:   01/06/18 2030 01/06/18 2100 01/06/18 2125 01/07/18 0545  BP: (!) 142/95 (!) 135/105 (!) 147/90 129/70  Pulse: 80  75 80  Resp: 15 18 17 15   Temp:   97.7 F (36.5 C) 98.6 F (37 C)  TempSrc:   Oral   SpO2: 100%  99% 100%  Weight:   146 lb 13.2 oz (66.6 kg)   Height:   5\' 11"  (1.803 m)    No intake or output data in the 24 hours ending 01/07/18 0756 Filed Weights   01/06/18 1818 01/06/18 2125  Weight: 160 lb (72.6 kg) 146 lb 13.2 oz (66.6 kg)   Body mass index is 20.48 kg/m.  General:  Well nourished, well developed, in no acute distress HEENT: normal Lymph: no adenopathy Neck: no JVD Endocrine:  No  thryomegaly Vascular: No carotid bruits; FA pulses 2+ bilaterally without bruits  Cardiac:  normal S1, S2; RRR; 2/6 systolic murmur LSB Lungs:  clear to auscultation bilaterally, no wheezing, rhonchi or rales  Abd: soft, nontender, no hepatomegaly  Ext: no edema Musculoskeletal:  No deformities, BUE and  BLE strength normal and equal Skin: warm and dry  Neuro:  CNs 2-12 intact, no focal abnormalities noted Psych:  Normal affect   EKG:  The EKG was personally reviewed and demonstrates:  NSR with new TWI ant on today's ekg. See above Telemetry:  Telemetry was personally reviewed and demonstrates:  NSR with ST at 140/m 6 am.  Relevant CV Studies:    Laboratory Data:  Chemistry Recent Labs  Lab 01/06/18 1835 01/07/18 0109  NA 141 139  K 3.7 3.8  CL 111 109  CO2 22 22  GLUCOSE 99 107*  BUN 11 10  CREATININE 0.91 0.72  CALCIUM 8.6* 8.4*  GFRNONAA >60 >60  GFRAA >60 >60  ANIONGAP 8 8    No results for input(s): PROT, ALBUMIN, AST, ALT, ALKPHOS, BILITOT in the last 168 hours. Hematology Recent Labs  Lab 01/06/18 1835 01/07/18 0109  WBC 7.2 4.1  RBC 3.54* 3.37*  HGB 12.0* 11.4*  HCT 35.8* 34.4*  MCV 101.1* 102.1*  MCH 33.9 33.8  MCHC 33.5 33.1  RDW 12.6 12.7  PLT 228 232   Cardiac Enzymes Recent Labs  Lab 01/06/18 1835 01/07/18 0109 01/07/18 0639  TROPONINI <0.03 <0.03 <0.03   No results for input(s): TROPIPOC in the last 168 hours.  BNPNo results for input(s): BNP, PROBNP in the last 168 hours.  DDimer No results for input(s): DDIMER in the last 168 hours.  Radiology/Studies:  Dg Chest 2 View  Result Date: 01/06/2018 CLINICAL DATA:  Left-sided nonradiating chest pain for 3-4 days. EXAM: CHEST - 2 VIEW COMPARISON:  01/16/2015 FINDINGS: Heart size is normal. Mild ectasia of the thoracic aorta without aneurysm. Clear lungs. No acute osseous abnormality. IMPRESSION: No active cardiopulmonary disease. Electronically Signed   By: Ashley Royalty M.D.   On: 01/06/2018  19:20   Dg Foot Complete Right  Result Date: 01/06/2018 CLINICAL DATA:  Right foot pain, question gout EXAM: RIGHT FOOT COMPLETE - 3+ VIEW COMPARISON:  06/18/2013 FINDINGS: There is no evidence of fracture or dislocation. Plantar and dorsal calcaneal enthesopathy. There is no evidence of arthropathy or other focal bone abnormality. No soft tissue tophus nor extra articular erosive change. IMPRESSION: Calcaneal enthesophytes.  No acute osseous abnormality. Electronically Signed   By: Ashley Royalty M.D.   On: 01/06/2018 19:19    Assessment and Plan:   1. Atypical chest pain troponins negative x3 but EKG today shows new nonspecific T wave inversion anteriorly compared to yesterday in 2016. Repeat EKG now and decide on exercise myoview vs cath. 2. Tobacco abuse 3. Essential hypertension-patient says he wasn't on meds PTA, not on meds here. Add low does beta blocker.  4. Hyperlipidemia 5. Acute gout flare 6. EtOH   For questions or updates, please contact [place] Please consult [email or web address] for contact info under Cardiology/STEMI.   Sumner Boast, PA-C  01/07/2018 7:56 AM   Attending note  Patient seen and discussed with PA Bonnell Public, I agree with her documentation above. 57 yo male history of HTN, HL, EtOH abuse, tobacco abuse admitted with chest pain. Symptoms started a few days ago. Sharp pain left chest, can be up to 10/10. Can have some chills associated, no other symptoms. Can last up to 2-3 hours constantly. Can be positional, worst with laying on left side, better with laying on right side. Very physcially active. Rides his bike up to 2 miles a day, though has not rode that much over the last 1-2 weeks because his job has not needed him.  Cuts grass regularly with push mower up to 20-30 minutes as recent as 2 days ago, no exertional symptoms.    K 3.7 Cr 0.91 WBC 7.2 Hgb 12  Trop neg x 3 CXR negative  EKG SR, no ischemic changes. Repeat EKG nonspecific ST/T changes  57  yo male with multiple CAD risk factors presents with fairly atypical chest pain. EKG with nonspecific ST/T changes. Given his risk factors and nonspecific EKG changes will plan for exercise nuclear stress test to further evaluate.   Carlyle Dolly MD

## 2018-01-07 NOTE — Care Management Obs Status (Signed)
[facility] NOTIFICATION   Patient Details  Name: Wesley Brennan MRN: [id number] Date of Birth: 1961/01/21   Medicare Observation Status Notification Given:  Yes    Sherald Barge, RN 01/07/2018, 12:11 PM

## 2018-01-08 ENCOUNTER — Telehealth: Payer: Self-pay

## 2018-01-08 LAB — HIV ANTIBODY (ROUTINE TESTING W REFLEX): HIV SCREEN 4TH GENERATION: NONREACTIVE

## 2018-01-08 NOTE — Telephone Encounter (Signed)
Transition Care Management Follow-up Telephone Call   Date discharged? 01/07/18               How have you been since you were released from the hospital? a whole lot better. Taking medications for gout and chest pain and those are helping me feel a whole lot better   Do you understand why you were in the hospital? Chest pain and gout   Do you understand the discharge instructions? yes   Where were you discharged to? home   Items Reviewed:  Medications reviewed: yes  Allergies reviewed: yes  Dietary changes reviewed: heart healthy   Referrals reviewed: cardiology   Functional Questionnaire:   Activities of Daily Living (ADLs):  yes    Any transportation issues/concerns?: Patient rides his bicycle but able to get to his appointment    Any patient concerns? Patient just wants to "be well"   Confirmed importance and date/time of follow-up visits scheduled January 16, 2018 at 10:00 am     Confirmed with patient if condition begins to worsen call PCP or go to the ER.  Patient was given the office number and encouraged to call back with question or concerns.  yes with verbal understanding

## 2018-01-16 ENCOUNTER — Ambulatory Visit (INDEPENDENT_AMBULATORY_CARE_PROVIDER_SITE_OTHER): Payer: Medicare Other | Admitting: Family Medicine

## 2018-01-16 ENCOUNTER — Encounter: Payer: Self-pay | Admitting: Family Medicine

## 2018-01-16 ENCOUNTER — Other Ambulatory Visit: Payer: Self-pay

## 2018-01-16 VITALS — BP 130/76 | HR 74 | Resp 14 | Ht 71.0 in | Wt 152.1 lb

## 2018-01-16 DIAGNOSIS — R072 Precordial pain: Secondary | ICD-10-CM

## 2018-01-16 DIAGNOSIS — F17210 Nicotine dependence, cigarettes, uncomplicated: Secondary | ICD-10-CM | POA: Diagnosis not present

## 2018-01-16 DIAGNOSIS — F17200 Nicotine dependence, unspecified, uncomplicated: Secondary | ICD-10-CM | POA: Diagnosis not present

## 2018-01-16 DIAGNOSIS — I10 Essential (primary) hypertension: Secondary | ICD-10-CM | POA: Diagnosis not present

## 2018-01-16 DIAGNOSIS — E785 Hyperlipidemia, unspecified: Secondary | ICD-10-CM

## 2018-01-16 DIAGNOSIS — Z09 Encounter for follow-up examination after completed treatment for conditions other than malignant neoplasm: Secondary | ICD-10-CM | POA: Diagnosis not present

## 2018-01-16 DIAGNOSIS — R9431 Abnormal electrocardiogram [ECG] [EKG]: Secondary | ICD-10-CM | POA: Diagnosis not present

## 2018-01-16 NOTE — Patient Instructions (Signed)
Keep follow up appt in September  CBC, chem 7  Next Monday You will get an appointment with the cardiologist.  NO mORE cigarett3s  You say you smoke 2/ day do NOT buy any more  No more alcohol!!!

## 2018-01-18 ENCOUNTER — Encounter: Payer: Self-pay | Admitting: Family Medicine

## 2018-01-18 DIAGNOSIS — Z09 Encounter for follow-up examination after completed treatment for conditions other than malignant neoplasm: Secondary | ICD-10-CM | POA: Insufficient documentation

## 2018-01-18 NOTE — Assessment & Plan Note (Signed)
Hyperlipidemia:Low fat diet discussed and encouraged.   Lipid Panel  Lab Results  Component Value Date   CHOL 192 04/20/2017   HDL 85 04/20/2017   LDLCALC 81 04/20/2017   TRIG 154 (H) 04/20/2017   CHOLHDL 2.3 04/20/2017   Pt to be consistent in taking medication prescribed and lower fat intake

## 2018-01-18 NOTE — Progress Notes (Signed)
   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 Denies sinus pressure, nasal congestion, ear pain or sore throat. Denies chest congestion, productive cough or wheezing. Denies current  chest pains, palpitations and leg swelling Denies abdominal pain, nausea, vomiting,diarrhea or constipation.   Denies dysuria, frequency, hesitancy or incontinence. Denies joint pain, swelling and limitation in mobility. Denies headaches, seizures, numbness, or tingling. Denies depression, anxiety or insomnia. Denies skin break down or rash.   PE  BP 130/76 (BP Location: Right Arm, Patient Position: Sitting, Cuff Size: Normal)   Pulse 74   Resp 14   Ht 5\' 11"  (1.803 m)   Wt 152 lb 1.3 oz (69 kg)   SpO2 99%   BMI 21.21 kg/m   Patient alert and oriented and in no cardiopulmonary distress.  HEENT: No facial asymmetry, EOMI,   oropharynx pink and moist.  Neck supple no JVD, no mass.  Chest: Clear to auscultation bilaterally.No reproducible chest wall pain  CVS: S1, S2 no murmurs, no S3.Regular rate.  ABD: Soft non tender.   Ext: No edema  MS: Adequate ROM spine,  hips and knees.  Skin: Intact, no ulcerations or rash noted.  Psych: Good eye contact, normal affect.  not anxious or depressed  appearing.  CNS: CN 2-12 intact, power,  normal throughout.no focal deficits noted.   [facility] discharge follow-up Hospitalization , reason for , course and implications of discussed with the patient, and the need for him to change his lifestyle of nicotine and alcohol dependence as well as to continue to take the medication he is discharged on faithfully to reduce his chances of premature death  From a heart attack and recurrent hospitalization are discussed.  I will refer him to cardiology for hospital f/u of increased CV risk wt abnormal EKG   Essential hypertension Controlled, no change in medication   Hyperlipidemia LDL goal <100 Hyperlipidemia:Low fat diet discussed and encouraged.   Lipid Panel  Lab Results  Component Value Date   CHOL 192 04/20/2017   HDL 85 04/20/2017   LDLCALC 81 04/20/2017   TRIG 154 (H) 04/20/2017   CHOLHDL 2.3 04/20/2017   Pt to be consistent in taking medication prescribed and lower fat intake     NICOTINE ADDICTION Asked: confirms current 3/ day Assess: unwilling to set a quit date Advise: needs to quit, documented past heart damage on EKG , recently hospitalized with chest pain, needs to quit, also to reduce cancer risk Assist: counseled for 3 minutes and literature provided Arrange f/u in 3 monhts

## 2018-01-18 NOTE — Assessment & Plan Note (Signed)
Hospitalization , reason for , course and implications of discussed with the patient, and the need for him to change his lifestyle of nicotine and alcohol dependence as well as to continue to take the medication he is discharged on faithfully to reduce his chances of premature death  From a heart attack and recurrent hospitalization are discussed.  I will refer him to cardiology for hospital f/u of increased CV risk wt abnormal EKG

## 2018-01-18 NOTE — Assessment & Plan Note (Signed)
Asked: confirms current 3/ day Assess: unwilling to set a quit date Advise: needs to quit, documented past heart damage on EKG , recently hospitalized with chest pain, needs to quit, also to reduce cancer risk Assist: counseled for 3 minutes and literature provided Arrange f/u in 3 monhts

## 2018-01-18 NOTE — Assessment & Plan Note (Signed)
Controlled, no change in medication  

## 2018-01-21 ENCOUNTER — Other Ambulatory Visit: Payer: Self-pay | Admitting: Family Medicine

## 2018-01-21 DIAGNOSIS — I10 Essential (primary) hypertension: Secondary | ICD-10-CM | POA: Diagnosis not present

## 2018-01-21 LAB — CBC
HCT: 35.6 % — ABNORMAL LOW (ref 38.5–50.0)
Hemoglobin: 12.4 g/dL — ABNORMAL LOW (ref 13.2–17.1)
MCH: 33.3 pg — ABNORMAL HIGH (ref 27.0–33.0)
MCHC: 34.8 g/dL (ref 32.0–36.0)
MCV: 95.7 fL (ref 80.0–100.0)
MPV: 9.7 fL (ref 7.5–12.5)
Platelets: 209 10*3/uL (ref 140–400)
RBC: 3.72 10*6/uL — ABNORMAL LOW (ref 4.20–5.80)
RDW: 11.9 % (ref 11.0–15.0)
WBC: 8.4 10*3/uL (ref 3.8–10.8)

## 2018-01-21 LAB — BASIC METABOLIC PANEL WITH GFR
BUN: 24 mg/dL (ref 7–25)
CHLORIDE: 108 mmol/L (ref 98–110)
CO2: 21 mmol/L (ref 20–32)
Calcium: 9.2 mg/dL (ref 8.6–10.3)
Creat: 0.99 mg/dL (ref 0.70–1.33)
GFR, Est African American: 96 mL/min/{1.73_m2} (ref >=60)
GFR, Est Non African American: 83 mL/min/{1.73_m2} (ref >=60)
GLUCOSE: 98 mg/dL (ref 65–99)
POTASSIUM: 4.5 mmol/L (ref 3.5–5.3)
SODIUM: 141 mmol/L (ref 135–146)

## 2018-01-28 ENCOUNTER — Telehealth: Payer: Self-pay | Admitting: Family Medicine

## 2018-01-28 NOTE — Telephone Encounter (Signed)
Patient would like a phone call to go over recent lab results.  [phone]

## 2018-01-28 NOTE — Telephone Encounter (Signed)
Spoke with patient and gave results of labs with verbal understanding.

## 2018-02-26 ENCOUNTER — Ambulatory Visit: Payer: Medicare Other | Admitting: Cardiology

## 2018-03-01 ENCOUNTER — Ambulatory Visit (INDEPENDENT_AMBULATORY_CARE_PROVIDER_SITE_OTHER): Payer: Medicare Other | Admitting: Cardiology

## 2018-03-01 ENCOUNTER — Encounter: Payer: Self-pay | Admitting: Cardiology

## 2018-03-01 VITALS — BP 108/76 | HR 77 | Ht 71.0 in | Wt 143.0 lb

## 2018-03-01 DIAGNOSIS — R0789 Other chest pain: Secondary | ICD-10-CM | POA: Diagnosis not present

## 2018-03-01 NOTE — Progress Notes (Signed)
Clinical Summary Wesley Brennan is a 57 y.o.male seen today for hospital follow up of the following medical probles.   1. Chest pain - admit 12/2017 with atypical chest pain.   - described at that time as sharp pain left chest, can be up to 10/10. Can have some chills associated, no other symptoms. Can last up to 2-3 hours constantly. Can be positional, worst with laying on left side, better with laying on right side - very physically active, rides his bike up to 2 miles a day, had no exertional symptoms. Uses pushing mower without symptoms.  - trops negative, EKG nonspecific changes - 12/2017 nuclear stress suggests inferior infarct though may be artifact, no ischemia. LVEF 34% - 12/2017 echo LVEF 50%, no WMAs  - since discharge no recent symptoms. Back to riding his bike several miles a day, mowing yards with a push mower regularly. No exertional symptoms.      Past Medical History:  Diagnosis Date  . Hypercholesteremia   . Hypertension    related to drug use which he has quit  . Substance abuse ([facility])    alcohol, nicotine, h/o coacaine and marijuana     No Known Allergies   Current Outpatient Medications  Medication Sig Dispense Refill  . aspirin EC 81 MG tablet Take 81 mg by mouth daily.    . metoprolol tartrate (LOPRESSOR) 25 MG tablet Take 1 tablet (25 mg total) by mouth 2 (two) times daily. 60 tablet 0  . montelukast (SINGULAIR) 10 MG tablet Take 1 tablet (10 mg total) by mouth at bedtime. 30 tablet 3  . pravastatin (PRAVACHOL) 40 MG tablet Take 1 tablet (40 mg total) by mouth daily. 90 tablet 3   No current facility-administered medications for this visit.      Past Surgical History:  Procedure Laterality Date  . COLONOSCOPY N/A 07/02/2015   Procedure: COLONOSCOPY;  Surgeon: Danie Binder, MD;  Location: [facility];  Service: Endoscopy;  Laterality: N/A;  1:00 PM  . ELBOW FUSION  1993   left elbow dislocation  . ELBOW FUSION Left 1979  . HERNIA REPAIR  2458  approx   umbilical hernia     No Known Allergies    Family History  Adopted: Yes  Problem Relation Age of Onset  . Cancer Mother 28       Bone   . Diabetes Mother   . Hypertension Sister   . Stroke Father 7  . Heart disease Father 38  . Diabetes Father   . Colon cancer Neg Hx      Social History Wesley Brennan reports that he has been smoking cigarettes. He has a 10.00 pack-year smoking history. He has never used smokeless tobacco. Wesley Brennan reports that he drinks about 2.0 standard drinks of alcohol per week.   Review of Systems CONSTITUTIONAL: No weight loss, fever, chills, weakness or fatigue.  HEENT: Eyes: No visual loss, blurred vision, double vision or yellow sclerae.No hearing loss, sneezing, congestion, runny nose or sore throat.  SKIN: No rash or itching.  CARDIOVASCULAR: per hpi RESPIRATORY: No shortness of breath, cough or sputum.  GASTROINTESTINAL: No anorexia, nausea, vomiting or diarrhea. No abdominal pain or blood.  GENITOURINARY: No burning on urination, no polyuria NEUROLOGICAL: No headache, dizziness, syncope, paralysis, ataxia, numbness or tingling in the extremities. No change in bowel or bladder control.  MUSCULOSKELETAL: No muscle, back pain, joint pain or stiffness.  LYMPHATICS: No enlarged nodes. No history of splenectomy.  PSYCHIATRIC: No  history of depression or anxiety.  ENDOCRINOLOGIC: No reports of sweating, cold or heat intolerance. No polyuria or polydipsia.  Marland Kitchen   Physical Examination Vitals:   03/01/18 0829  BP: 108/76  Pulse: 77  SpO2: 98%   Vitals:   03/01/18 0829  Weight: 143 lb (64.9 kg)  Height: 5\' 11"  (1.803 m)    Gen: resting comfortably, no acute distress HEENT: no scleral icterus, pupils equal round and reactive, no palptable cervical adenopathy,  CV: RRR, no m/r/g, no jvd Resp: Clear to auscultation bilaterally GI: abdomen is soft, non-tender, non-distended, normal bowel sounds, no hepatosplenomegaly MSK: extremities  are warm, no edema.  Skin: warm, no rash Neuro:  no focal deficits Psych: appropriate affect     Assessment and Plan  1. Chest pain - atypical symptoms that have resolved. Probable artifact on nuclear stress test, no evidence of ischemia. Echo with normal LVEF and no WMAs. Tolerates high levels of exertion daily without symptoms - no further workup at this time, continue risk factor modiciation  F/u 1 year      Arnoldo Lenis, M.D.

## 2018-03-01 NOTE — Patient Instructions (Signed)

## 2018-04-05 ENCOUNTER — Ambulatory Visit (INDEPENDENT_AMBULATORY_CARE_PROVIDER_SITE_OTHER): Payer: Medicare Other

## 2018-04-05 DIAGNOSIS — Z23 Encounter for immunization: Secondary | ICD-10-CM | POA: Diagnosis not present

## 2018-04-18 ENCOUNTER — Encounter: Payer: Medicare Other | Admitting: Family Medicine

## 2018-04-24 ENCOUNTER — Encounter: Payer: Self-pay | Admitting: Family Medicine

## 2018-04-24 ENCOUNTER — Encounter: Payer: Medicare Other | Admitting: Family Medicine

## 2018-04-24 ENCOUNTER — Ambulatory Visit (INDEPENDENT_AMBULATORY_CARE_PROVIDER_SITE_OTHER): Payer: Medicare Other | Admitting: Family Medicine

## 2018-04-24 VITALS — BP 130/82 | HR 77 | Resp 16 | Ht 71.0 in | Wt 151.0 lb

## 2018-04-24 DIAGNOSIS — E785 Hyperlipidemia, unspecified: Secondary | ICD-10-CM | POA: Diagnosis not present

## 2018-04-24 DIAGNOSIS — Z0000 Encounter for general adult medical examination without abnormal findings: Secondary | ICD-10-CM

## 2018-04-24 DIAGNOSIS — Z122 Encounter for screening for malignant neoplasm of respiratory organs: Secondary | ICD-10-CM | POA: Diagnosis not present

## 2018-04-24 DIAGNOSIS — I10 Essential (primary) hypertension: Secondary | ICD-10-CM

## 2018-04-24 DIAGNOSIS — M79673 Pain in unspecified foot: Secondary | ICD-10-CM | POA: Insufficient documentation

## 2018-04-24 DIAGNOSIS — F17200 Nicotine dependence, unspecified, uncomplicated: Secondary | ICD-10-CM

## 2018-04-24 DIAGNOSIS — F17210 Nicotine dependence, cigarettes, uncomplicated: Secondary | ICD-10-CM | POA: Diagnosis not present

## 2018-04-24 DIAGNOSIS — M79672 Pain in left foot: Secondary | ICD-10-CM | POA: Insufficient documentation

## 2018-04-24 DIAGNOSIS — W458XXA Other foreign body or object entering through skin, initial encounter: Secondary | ICD-10-CM

## 2018-04-24 NOTE — Patient Instructions (Addendum)
Wellness with nurse past due, please schedulke  Fasting lipid, hepatic panel, PSA , uric acid levelpast due needed.  X ray right foot   We will call with appt for lung dscan   Steps to Quit Smoking Smoking tobacco can be bad for your health. It can also affect almost every organ in your body. Smoking puts you and people around you at risk for many serious long-lasting (chronic) diseases. Quitting smoking is hard, but it is one of the best things that you can do for your health. It is never too late to quit. What are the benefits of quitting smoking? When you quit smoking, you lower your risk for getting serious diseases and conditions. They can include:  Lung cancer or lung disease.  Heart disease.  Stroke.  Heart attack.  Not being able to have children (infertility).  Weak bones (osteoporosis) and broken bones (fractures).  If you have coughing, wheezing, and shortness of breath, those symptoms may get better when you quit. You may also get sick less often. If you are pregnant, quitting smoking can help to lower your chances of having a baby of low birth weight. What can I do to help me quit smoking? Talk with your doctor about what can help you quit smoking. Some things you can do (strategies) include:  Quitting smoking totally, instead of slowly cutting back how much you smoke over a period of time.  Going to in-person counseling. You are more likely to quit if you go to many counseling sessions.  Using resources and support systems, such as: ? Database administrator with a Social worker. ? Phone quitlines. ? Careers information officer. ? Support groups or group counseling. ? Text messaging programs. ? Mobile phone apps or applications.  Taking medicines. Some of these medicines may have nicotine in them. If you are pregnant or breastfeeding, do not take any medicines to quit smoking unless your doctor says it is okay. Talk with your doctor about counseling or other things that can  help you.  Talk with your doctor about using more than one strategy at the same time, such as taking medicines while you are also going to in-person counseling. This can help make quitting easier. What things can I do to make it easier to quit? Quitting smoking might feel very hard at first, but there is a lot that you can do to make it easier. Take these steps:  Talk to your family and friends. Ask them to support and encourage you.  Call phone quitlines, reach out to support groups, or work with a Social worker.  Ask people who smoke to not smoke around you.  Avoid places that make you want (trigger) to smoke, such as: ? Bars. ? Parties. ? Smoke-break areas at work.  Spend time with people who do not smoke.  Lower the stress in your life. Stress can make you want to smoke. Try these things to help your stress: ? Getting regular exercise. ? Deep-breathing exercises. ? Yoga. ? Meditating. ? Doing a body scan. To do this, close your eyes, focus on one area of your body at a time from head to toe, and notice which parts of your body are tense. Try to relax the muscles in those areas.  Download or buy apps on your mobile phone or tablet that can help you stick to your quit plan. There are many free apps, such as QuitGuide from the [employer] Office manager for Disease Control and Prevention). You can find more support from smokefree.gov and  other websites.  This information is not intended to replace advice given to you by your health care provider. Make sure you discuss any questions you have with your health care provider. Document Released: 04/29/2009 Document Revised: 02/29/2016 Document Reviewed: 11/17/2014 Elsevier Interactive Patient Education  2018 [employer].

## 2018-04-24 NOTE — Progress Notes (Signed)
   Wesley Brennan     MRN: [id number]      DOB: 1960-08-16   HPI: Patient is in for annual physical exam. C/o 1 week h/o left foot pain states accidentally stepped on broken Recent labs, if available are reviewed. Immunization is reviewed , and  Is up to date    PE; BP 130/82   Pulse 77   Resp 16   Ht 5\' 11"  (1.803 m)   Wt 151 lb (68.5 kg)   SpO2 97%   BMI 21.06 kg/m   Pleasant male, alert and oriented x 3, in no cardio-pulmonary distress. Afebrile. HEENT No facial trauma or asymetry. Sinuses non tender. EOMI, pupils equally reactive to light. External ears normal, tympanic membranes clear. Oropharynx moist, no exudate.Poor dentition Neck: supple, no adenopathy,JVD or thyromegaly.No bruits.  Chest: Clear to ascultation bilaterally.No crackles or wheezes.Decreased air entry Non tender to palpation  Breast: No asymetry,no masses. No nipple discharge or inversion. No axillary or supraclavicular adenopathy  Cardiovascular system; Heart sounds normal,  S1 and  S2 ,no S3.  No murmur, or thrill. Apical beat not displaced Peripheral pulses normal.  Abdomen: Soft, non tender, no organomegaly or masses. No bruits. Bowel sounds normal. No guarding, tenderness or rebound.     Musculoskeletal exam: Full ROM of spine, hips ,  and knees. deformity of LUE with muscle wasting .  Tender over left heel Neurologic: Cranial nerves 2 to 12 intact. Power, tone ,sensation and reflexes normal throughout. No disturbance in gait. No tremor.  Skin: Intact, no ulceration, erythema , scaling or rash noted.No open skin lesion, no palpable object in area of concern on sole of left foot, no erythema or drainage Pigmentation normal throughout  Psych; Normal mood and affect.   Assessment & Plan:  Annual physical exam Annual exam as documented. Counseling done  re healthy lifestyle involving commitment to 150 minutes exercise per week, heart healthy diet, and attaining healthy  weight.The importance of adequate sleep also discussed. . Immunization and cancer screening needs are specifically addressed at this visit.   NICOTINE ADDICTION Asked: confirms 0.5 PPD Assist: discussed for 5 mins need to stop smoking, literature provided, and `800QUITNOW Advise : needs to stop to reduce heart disease and stroke and cancwer Assess: not willing to quit date , but will reduce   Arrange: f/u in 4 months  Foot pain, left 1 week h/o left heel pain, fell in the house an stepped on glass, need X ray, I think this is heel spur  Heel pain 1 week h/o left heel pain , concerned re glass in foot, normal exam , likely heel spur, obtain X ray  Smoking greater than 30 pack years nmeeds lung cancer screening, counseled to quit and is cutting back

## 2018-04-24 NOTE — Assessment & Plan Note (Signed)
nmeeds lung cancer screening, counseled to quit and is cutting back

## 2018-04-24 NOTE — Assessment & Plan Note (Signed)
1 week h/o left heel pain , concerned re glass in foot, normal exam , likely heel spur, obtain X ray

## 2018-04-24 NOTE — Assessment & Plan Note (Signed)
1 week h/o left heel pain, fell in the house an stepped on glass, need X ray, I think this is heel spur

## 2018-04-24 NOTE — Assessment & Plan Note (Signed)
Asked: confirms 0.5 PPD Assist: discussed for 5 mins need to stop smoking, literature provided, and `800QUITNOW Advise : needs to stop to reduce heart disease and stroke and cancwer Assess: not willing to quit date , but will reduce   Arrange: f/u in 4 months

## 2018-04-24 NOTE — Assessment & Plan Note (Signed)
Annual exam as documented. Counseling done  re healthy lifestyle involving commitment to 150 minutes exercise per week, heart healthy diet, and attaining healthy weight.The importance of adequate sleep also discussed.  Immunization and cancer screening needs are specifically addressed at this visit.  

## 2018-04-25 DIAGNOSIS — Z0000 Encounter for general adult medical examination without abnormal findings: Secondary | ICD-10-CM | POA: Diagnosis not present

## 2018-04-25 DIAGNOSIS — Z125 Encounter for screening for malignant neoplasm of prostate: Secondary | ICD-10-CM | POA: Diagnosis not present

## 2018-04-25 DIAGNOSIS — E785 Hyperlipidemia, unspecified: Secondary | ICD-10-CM | POA: Diagnosis not present

## 2018-04-25 LAB — LIPID PANEL
CHOLESTEROL: 213 mg/dL — AB (ref <200)
HDL: 74 mg/dL (ref >40)
Non-HDL Cholesterol (Calc): 139 mg/dL (calc) — ABNORMAL HIGH (ref <130)
Total CHOL/HDL Ratio: 2.9 (calc) (ref <5.0)
Triglycerides: 408 mg/dL — ABNORMAL HIGH (ref <150)

## 2018-04-25 LAB — URIC ACID: Uric Acid, Serum: 8.5 mg/dL — ABNORMAL HIGH (ref 4.0–8.0)

## 2018-04-25 LAB — HEPATIC FUNCTION PANEL
AG Ratio: 1.6 (calc) (ref 1.0–2.5)
ALT: 54 U/L — ABNORMAL HIGH (ref 9–46)
AST: 77 U/L — AB (ref 10–35)
Albumin: 3.9 g/dL (ref 3.6–5.1)
Alkaline phosphatase (APISO): 94 U/L (ref 40–115)
BILIRUBIN DIRECT: 0.1 mg/dL (ref 0.0–0.2)
BILIRUBIN TOTAL: 0.6 mg/dL (ref 0.2–1.2)
Globulin: 2.5 g/dL (calc) (ref 1.9–3.7)
Indirect Bilirubin: 0.5 mg/dL (calc) (ref 0.2–1.2)
Total Protein: 6.4 g/dL (ref 6.1–8.1)

## 2018-04-25 LAB — PSA: PSA: 1.4 ng/mL (ref <=4.0)

## 2018-04-26 ENCOUNTER — Other Ambulatory Visit: Payer: Self-pay | Admitting: Family Medicine

## 2018-05-09 ENCOUNTER — Telehealth: Payer: Self-pay

## 2018-05-09 DIAGNOSIS — Z1211 Encounter for screening for malignant neoplasm of colon: Secondary | ICD-10-CM

## 2018-05-09 DIAGNOSIS — Z122 Encounter for screening for malignant neoplasm of respiratory organs: Principal | ICD-10-CM

## 2018-05-09 DIAGNOSIS — Z87891 Personal history of nicotine dependence: Secondary | ICD-10-CM

## 2018-05-09 DIAGNOSIS — F17200 Nicotine dependence, unspecified, uncomplicated: Secondary | ICD-10-CM

## 2018-05-09 LAB — HEMOCCULT GUIAC POC 1CARD (OFFICE)
Card #2 Fecal Occult Blod, POC: NEGATIVE
FECAL OCCULT BLD: NEGATIVE
Fecal Occult Blood, POC: NEGATIVE

## 2018-05-09 NOTE — Telephone Encounter (Signed)
-----   Message from Fayrene Helper, MD sent at 04/24/2018  9:11 AM EDT ----- Regarding: chest scan, lung cancer screening, can't get it entered, help please Has over 30 pack year history listed

## 2018-05-10 ENCOUNTER — Telehealth: Payer: Self-pay

## 2018-05-10 DIAGNOSIS — E785 Hyperlipidemia, unspecified: Secondary | ICD-10-CM

## 2018-05-10 NOTE — Telephone Encounter (Signed)
-----   Message from Fayrene Helper, MD sent at 04/26/2018  6:16 AM EDT ----- YOUR LIVER ENZYMES HAVE INCREASED ALSO tg AND URIC ACID, THIS TELLS ME THAT YOU ARE STILL DRINKING TO MUCH BEER AND LIQUOR, WHICH YOU NEED TO STOP NEW FOR HIGH URIC ACID , WHICH INCREASES RISK OF GOUT IS ALLOPURINOL ONE DAILY THIS IS PRESCRIBED. STOP CHOLESTEROL MEDICATION (PRAVASTATIN) FOR NEXT 2 MONTHS , AND GET REPEAT B;LOOD TEST OF YOUR LIVER FUNCTION, ( NON FASTING) THE LAB ORDER SHEET IS MAILED Y TO YOU

## 2018-05-12 ENCOUNTER — Encounter (HOSPITAL_COMMUNITY): Payer: Self-pay | Admitting: Emergency Medicine

## 2018-05-12 ENCOUNTER — Other Ambulatory Visit: Payer: Self-pay

## 2018-05-12 ENCOUNTER — Emergency Department (HOSPITAL_COMMUNITY)
Admission: EM | Admit: 2018-05-12 | Discharge: 2018-05-12 | Disposition: A | Discharge status: Home / Self Care | Payer: Medicare Other | Attending: Emergency Medicine | Admitting: Emergency Medicine

## 2018-05-12 DIAGNOSIS — F17210 Nicotine dependence, cigarettes, uncomplicated: Secondary | ICD-10-CM | POA: Diagnosis not present

## 2018-05-12 DIAGNOSIS — M10072 Idiopathic gout, left ankle and foot: Secondary | ICD-10-CM | POA: Diagnosis not present

## 2018-05-12 DIAGNOSIS — Z79899 Other long term (current) drug therapy: Secondary | ICD-10-CM | POA: Diagnosis not present

## 2018-05-12 DIAGNOSIS — Z7982 Long term (current) use of aspirin: Secondary | ICD-10-CM | POA: Diagnosis not present

## 2018-05-12 DIAGNOSIS — F79 Unspecified intellectual disabilities: Secondary | ICD-10-CM | POA: Diagnosis not present

## 2018-05-12 DIAGNOSIS — I10 Essential (primary) hypertension: Secondary | ICD-10-CM | POA: Insufficient documentation

## 2018-05-12 DIAGNOSIS — M79672 Pain in left foot: Secondary | ICD-10-CM | POA: Diagnosis present

## 2018-05-12 HISTORY — DX: Gout, unspecified: M10.9

## 2018-05-12 MED ORDER — KETOROLAC TROMETHAMINE 30 MG/ML IJ SOLN
30.0000 mg | Freq: Once | INTRAMUSCULAR | Status: AC
  Administered 2018-05-12: 30 mg via INTRAMUSCULAR
  Filled 2018-05-12: qty 1

## 2018-05-12 MED ORDER — PREDNISONE 20 MG PO TABS: 40.0000 mg | ORAL_TABLET | Freq: Every day | ORAL | 0 refills | Status: AC

## 2018-05-12 NOTE — ED Provider Notes (Signed)
[facility] EMERGENCY DEPARTMENT Provider Note   CSN: [id number] Arrival date & time: 05/12/18  1339     History   Chief Complaint Chief Complaint  Patient presents with  . Foot Pain    HPI Wesley Brennan is a 57 y.o. male.  57 y.o male with a PMH of Gout, HTN, Alcohol abuse presents to the ED with a chief complaint of left foot pain x 1 day. Patient orts he has a previous history of gout but was never given medication to take prophylactically.  He describes the pain as throbbing mainly on the dorsum part of his foot worse with ambulation.  Personally reviewed patient's chart and see patient had a prior admission due to his acute gout.  Also according to patient's chart and his primary care physician's notes patient has increased levels in triglycerides, uric acid from his last visit consistent with patient's alcohol abuse.  Denies any fevers, chills, trauma or, shortness of breath.     Past Medical History:  Diagnosis Date  . Gout   . Hypercholesteremia   . Hypertension    related to drug use which he has quit  . Substance abuse ([place])    alcohol, nicotine, h/o coacaine and marijuana    Patient Active Problem List   Diagnosis Date Noted  . Heel pain 04/24/2018  . Smoking greater than 30 pack years 04/24/2018  . Essential hypertension 01/06/2018  . Gout 01/06/2018  . Chest pain 01/06/2018  . Tobacco use 12/13/2016  . Left knee pain 12/13/2016  . Tubular adenoma of colon 07/08/2015  . Annual physical exam 02/01/2015  . Vision loss, bilateral 04/27/2014  . Allergic rhinitis 12/15/2013  . Hyperlipidemia LDL goal <100 06/26/2008  . Alcohol dependence ([place]) 06/26/2008  . NICOTINE ADDICTION 11/27/2007  . MENTAL RETARDATION 11/27/2007    Past Surgical History:  Procedure Laterality Date  . COLONOSCOPY N/A 07/02/2015   Procedure: COLONOSCOPY;  Surgeon: Danie Binder, MD;  Location: [facility];  Service: Endoscopy;  Laterality: N/A;  1:00 PM  . ELBOW FUSION  1993     left elbow dislocation  . ELBOW FUSION Left 1979  . HERNIA REPAIR  4627 approx   umbilical hernia        Home Medications    Prior to Admission medications   Medication Sig Start Date End Date Taking? Authorizing Provider  aspirin EC 81 MG tablet Take 81 mg by mouth daily.    [provider]  metoprolol tartrate (LOPRESSOR) 25 MG tablet Take 1 tablet (25 mg total) by mouth 2 (two) times daily. 01/07/18   Kathie Dike, MD  montelukast (SINGULAIR) 10 MG tablet Take 1 tablet (10 mg total) by mouth at bedtime. 10/17/17   Fayrene Helper, MD  predniSONE (DELTASONE) 20 MG tablet Take 2 tablets (40 mg total) by mouth daily for 5 days. 05/12/18 05/17/18  Janeece Fitting, PA-C    Family History Family History  Adopted: Yes  Problem Relation Age of Onset  . Cancer Mother 86       Bone   . Diabetes Mother   . Hypertension Sister   . Stroke Father 70  . Heart disease Father 94  . Diabetes Father   . Colon cancer Neg Hx     Social History Social History   Tobacco Use  . Smoking status: Current Every Day Smoker    Packs/day: 0.25    Years: 40.00    Pack years: 10.00    Types: Cigarettes  .  Smokeless tobacco: Never Used  . Tobacco comment: 1/2 pk per day   Substance Use Topics  . Alcohol use: Yes    Alcohol/week: 2.0 standard drinks    Types: 2 Cans of beer per week    Comment: 80 oz everyday  . Drug use: No     Allergies   Patient has no known allergies.   Review of Systems Review of Systems  Constitutional: Negative for fever.  Musculoskeletal: Positive for arthralgias.     Physical Exam Updated Vital Signs BP (!) 142/92 (BP Location: Right Arm)   Pulse 93   Temp 98.8 F (37.1 C) (Oral)   Resp 16   Ht 5\' 11"  (1.803 m)   Wt 71.2 kg   SpO2 99%   BMI 21.90 kg/m   Physical Exam  Constitutional: He is oriented to person, place, and time. He appears well-developed and well-nourished.  HENT:  Head: Normocephalic and atraumatic.  Neck: Normal  range of motion. Neck supple.  Cardiovascular: Normal heart sounds.  Pulmonary/Chest: Breath sounds normal.  Abdominal: Soft.  Musculoskeletal: He exhibits tenderness.       Left foot: There is tenderness.       Feet:  Neurological: He is alert and oriented to person, place, and time.  Skin: Skin is warm and dry. There is erythema.  Nursing note and vitals reviewed.    ED Treatments / Results  Labs (all labs ordered are listed, but only abnormal results are displayed) Labs Reviewed - No data to display  EKG None  Radiology No results found.  Procedures Procedures (including critical care time)  Medications Ordered in ED Medications  ketorolac (TORADOL) 30 MG/ML injection 30 mg (30 mg Intramuscular Given 05/12/18 1452)     Initial Impression / Assessment and Plan / ED Course  I have reviewed the triage vital signs and the nursing notes.  Pertinent labs & imaging results that were available during my care of the patient were reviewed by me and considered in my medical decision making (see chart for details).     Presents with erythematous, tender to palpation left dorsum of the foot.  He does have a previous history of gout.  I have personally reviewed this patient's record and see his last visit with his primary care physician where he had a blood work done on 05/10/2018 revealed " liver enzymes have increased also triglycerides and uric acid, this tells me that you are still drinking too much beer and liquor which you need to stop ".  According to LPN's note on 78/93/8101 patient is prescribed allopurinol once daily which he states he is currently not taking prophylactically. During the evaluation patient exhibits tenderness to palpation along the dorsum aspect, erythema is present.  I have provided patient with a shot of Toradol in order to help with his pain.  I will also place him on a small taper of steroids prednisone for the next 5 days, he is advised to complete this  course of medication therapy and begin taking his allopurinol.  3:08 PM Patient reports relieve in pain, will send patient home with steroid burst for the next 5 days. I have advised patient he needs to contact his PCP to obtain the prescription for allopurinol.    Final Clinical Impressions(s) / ED Diagnoses   Final diagnoses:  Acute idiopathic gout of left foot    ED Discharge Orders         Ordered    predniSONE (DELTASONE) 20 MG tablet  Daily  05/12/18 1446           Janeece Fitting, PA-C 05/12/18 1525    Fredia Sorrow, MD 05/12/18 [phone]

## 2018-05-12 NOTE — ED Notes (Signed)
EDP at bedside updating patient. 

## 2018-05-12 NOTE — Discharge Instructions (Signed)
I have prescribed steroid for your acute gout flare, please take 2 pills daily for the next 5 days. Please call your PCP to obtain your prescription for allopurinol which you need to be taking prophylactic.You may want to discontinue red meat, beer, liquors.

## 2018-05-12 NOTE — ED Triage Notes (Signed)
PT c/o left foot pain with swelling x1 week. PT states history of gout but doesn't have any gout medications to take.

## 2018-05-13 ENCOUNTER — Other Ambulatory Visit: Payer: Self-pay | Admitting: Family Medicine

## 2018-05-13 MED ORDER — ALLOPURINOL 300 MG PO TABS: 300.0000 mg | ORAL_TABLET | Freq: Every day | ORAL | 6 refills | Status: DC

## 2018-05-31 ENCOUNTER — Ambulatory Visit (HOSPITAL_COMMUNITY): Admission: RE | Admit: 2018-05-31 | Payer: Medicare Other | Source: Ambulatory Visit

## 2018-06-03 ENCOUNTER — Ambulatory Visit (INDEPENDENT_AMBULATORY_CARE_PROVIDER_SITE_OTHER): Payer: Medicare Other

## 2018-06-03 ENCOUNTER — Telehealth: Payer: Self-pay | Admitting: Family Medicine

## 2018-06-03 VITALS — BP 135/89 | HR 83 | Ht 71.0 in | Wt 156.0 lb

## 2018-06-03 DIAGNOSIS — Z0000 Encounter for general adult medical examination without abnormal findings: Secondary | ICD-10-CM | POA: Diagnosis not present

## 2018-06-03 DIAGNOSIS — Z23 Encounter for immunization: Secondary | ICD-10-CM | POA: Diagnosis not present

## 2018-06-03 DIAGNOSIS — Z87891 Personal history of nicotine dependence: Secondary | ICD-10-CM

## 2018-06-03 NOTE — Patient Instructions (Signed)
Wesley Brennan , Thank you for taking time to come for your Medicare Wellness Visit. I appreciate your ongoing commitment to your health goals. Please review the following plan we discussed and let me know if I can assist you in the future.   Screening recommendations/referrals: Colonoscopy: up to date  Recommended yearly ophthalmology/optometry visit for glaucoma screening and checkup Recommended yearly dental visit for hygiene and checkup  Vaccinations: Influenza vaccine: up to date  Pneumococcal vaccine: given today  Tdap vaccine: up to date  Shingles vaccine: postponed     Advanced directives: Refused   Conditions/risks identified: Substance abuse, hypertension, hyperlipidemia   Next appointment: Wellness visit in one year   Preventive Care 40-64 Years, Male Preventive care refers to lifestyle choices and visits with your health care provider that can promote health and wellness. What does preventive care include?  A yearly physical exam. This is also called an annual well check.  Dental exams once or twice a year.  Routine eye exams. Ask your health care provider how often you should have your eyes checked.  Personal lifestyle choices, including:  Daily care of your teeth and gums.  Regular physical activity.  Eating a healthy diet.  Avoiding tobacco and drug use.  Limiting alcohol use.  Practicing safe sex.  Taking low-dose aspirin every day starting at age 24. What happens during an annual well check? The services and screenings done by your health care provider during your annual well check will depend on your age, overall health, lifestyle risk factors, and family history of disease. Counseling  Your health care provider may ask you questions about your:  Alcohol use.  Tobacco use.  Drug use.  Emotional well-being.  Home and relationship well-being.  Sexual activity.  Eating habits.  Work and work Statistician. Screening  You may have the following  tests or measurements:  Height, weight, and BMI.  Blood pressure.  Lipid and cholesterol levels. These may be checked every 5 years, or more frequently if you are over 46 years old.  Skin check.  Lung cancer screening. You may have this screening every year starting at age 86 if you have a 30-pack-year history of smoking and currently smoke or have quit within the past 15 years.  Fecal occult blood test (FOBT) of the stool. You may have this test every year starting at age 65.  Flexible sigmoidoscopy or colonoscopy. You may have a sigmoidoscopy every 5 years or a colonoscopy every 10 years starting at age 62.  Prostate cancer screening. Recommendations will vary depending on your family history and other risks.  Hepatitis C blood test.  Hepatitis B blood test.  Sexually transmitted disease (STD) testing.  Diabetes screening. This is done by checking your blood sugar (glucose) after you have not eaten for a while (fasting). You may have this done every 1-3 years. Discuss your test results, treatment options, and if necessary, the need for more tests with your health care provider. Vaccines  Your health care provider may recommend certain vaccines, such as:  Influenza vaccine. This is recommended every year.  Tetanus, diphtheria, and acellular pertussis (Tdap, Td) vaccine. You may need a Td booster every 10 years.  Zoster vaccine. You may need this after age 56.  Pneumococcal 13-valent conjugate (PCV13) vaccine. You may need this if you have certain conditions and have not been vaccinated.  Pneumococcal polysaccharide (PPSV23) vaccine. You may need one or two doses if you smoke cigarettes or if you have certain conditions. Talk to your  health care provider about which screenings and vaccines you need and how often you need them. This information is not intended to replace advice given to you by your health care provider. Make sure you discuss any questions you have with your  health care provider. Document Released: 07/30/2015 Document Revised: 03/22/2016 Document Reviewed: 05/04/2015 Elsevier Interactive Patient Education  2017 [employer] Prevention in the Home Falls can cause injuries. They can happen to people of all ages. There are many things you can do to make your home safe and to help prevent falls. What can I do on the outside of my home?  Regularly fix the edges of walkways and driveways and fix any cracks.  Remove anything that might make you trip as you walk through a door, such as a raised step or threshold.  Trim any bushes or trees on the path to your home.  Use bright outdoor lighting.  Clear any walking paths of anything that might make someone trip, such as rocks or tools.  Regularly check to see if handrails are loose or broken. Make sure that both sides of any steps have handrails.  Any raised decks and porches should have guardrails on the edges.  Have any leaves, snow, or ice cleared regularly.  Use sand or salt on walking paths during winter.  Clean up any spills in your garage right away. This includes oil or grease spills. What can I do in the bathroom?  Use night lights.  Install grab bars by the toilet and in the tub and shower. Do not use towel bars as grab bars.  Use non-skid mats or decals in the tub or shower.  If you need to sit down in the shower, use a plastic, non-slip stool.  Keep the floor dry. Clean up any water that spills on the floor as soon as it happens.  Remove soap buildup in the tub or shower regularly.  Attach bath mats securely with double-sided non-slip rug tape.  Do not have throw rugs and other things on the floor that can make you trip. What can I do in the bedroom?  Use night lights.  Make sure that you have a light by your bed that is easy to reach.  Do not use any sheets or blankets that are too big for your bed. They should not hang down onto the floor.  Have a firm chair  that has side arms. You can use this for support while you get dressed.  Do not have throw rugs and other things on the floor that can make you trip. What can I do in the kitchen?  Clean up any spills right away.  Avoid walking on wet floors.  Keep items that you use a lot in easy-to-reach places.  If you need to reach something above you, use a strong step stool that has a grab bar.  Keep electrical cords out of the way.  Do not use floor polish or wax that makes floors slippery. If you must use wax, use non-skid floor wax.  Do not have throw rugs and other things on the floor that can make you trip. What can I do with my stairs?  Do not leave any items on the stairs.  Make sure that there are handrails on both sides of the stairs and use them. Fix handrails that are broken or loose. Make sure that handrails are as long as the stairways.  Check any carpeting to make sure that it is  firmly attached to the stairs. Fix any carpet that is loose or worn.  Avoid having throw rugs at the top or bottom of the stairs. If you do have throw rugs, attach them to the floor with carpet tape.  Make sure that you have a light switch at the top of the stairs and the bottom of the stairs. If you do not have them, ask someone to add them for you. What else can I do to help prevent falls?  Wear shoes that:  Do not have high heels.  Have rubber bottoms.  Are comfortable and fit you well.  Are closed at the toe. Do not wear sandals.  If you use a stepladder:  Make sure that it is fully opened. Do not climb a closed stepladder.  Make sure that both sides of the stepladder are locked into place.  Ask someone to hold it for you, if possible.  Clearly mark and make sure that you can see:  Any grab bars or handrails.  First and last steps.  Where the edge of each step is.  Use tools that help you move around (mobility aids) if they are needed. These  include:  Canes.  Walkers.  Scooters.  Crutches.  Turn on the lights when you go into a dark area. Replace any light bulbs as soon as they burn out.  Set up your furniture so you have a clear path. Avoid moving your furniture around.  If any of your floors are uneven, fix them.  If there are any pets around you, be aware of where they are.  Review your medicines with your doctor. Some medicines can make you feel dizzy. This can increase your chance of falling. Ask your doctor what other things that you can do to help prevent falls. This information is not intended to replace advice given to you by your health care provider. Make sure you discuss any questions you have with your health care provider. Document Released: 04/29/2009 Document Revised: 12/09/2015 Document Reviewed: 08/07/2014 Elsevier Interactive Patient Education  2017 [employer].

## 2018-06-03 NOTE — Progress Notes (Signed)
Subjective:   Wesley Brennan is a 57 y.o. male who presents for Medicare Annual/Subsequent preventive examination.  Review of Systems:   Cardiac Risk Factors include: hypertension;male gender;smoking/ tobacco exposure     Objective:    Vitals: BP 135/89   Pulse 83   Ht 5\' 11"  (1.803 m)   Wt 156 lb (70.8 kg)   SpO2 98%   BMI 21.76 kg/m   Body mass index is 21.76 kg/m.  Advanced Directives 06/03/2018 05/12/2018 01/07/2018 10/30/2016 07/02/2015 04/27/2015 01/23/2015  Does Patient Have a Medical Advance Directive? No No No Yes No No No  Type of Advance Directive - - Public librarian;Living will - - -  Does patient want to make changes to medical advance directive? - - - No - Patient declined - - -  Copy of [place] in Chart? - - - No - copy requested - - -  Would patient like information on creating a medical advance directive? No - Patient declined No - Patient declined No - Patient declined - No - patient declined information No - patient declined information No - patient declined information    Tobacco Social History   Tobacco Use  Smoking Status Current Every Day Smoker  . Packs/day: 0.25  . Years: 40.00  . Pack years: 10.00  . Types: Cigarettes  Smokeless Tobacco Never Used  Tobacco Comment   1/2 pk per day      Ready to quit: No Counseling given: Yes Comment: 1/2 pk per day    Clinical Intake:  Pre-visit preparation completed: Yes  Pain : No/denies pain Pain Score: 0-No pain     BMI - recorded: 21.8 Nutritional Status: BMI of 19-24  Normal Nutritional Risks: None Diabetes: No  How often do you need to have someone help you when you read instructions, pamphlets, or other written materials from your doctor or pharmacy?: 2 - Rarely What is the last grade level you completed in school?: 12 grade   Interpreter Needed?: No  Information entered by :: Francena Hanly LPN   Past Medical History:  Diagnosis Date  . Gout   .  Hypercholesteremia   . Hypertension    related to drug use which he has quit  . Substance abuse ([place])    alcohol, nicotine, h/o coacaine and marijuana   Past Surgical History:  Procedure Laterality Date  . COLONOSCOPY N/A 07/02/2015   Procedure: COLONOSCOPY;  Surgeon: Danie Binder, MD;  [facility];  Service: Endoscopy;  Laterality: N/A;  1:00 PM  . ELBOW FUSION  1993   left elbow dislocation  . ELBOW FUSION Left 1979  . HERNIA REPAIR  0093 approx   umbilical hernia   Family History  Adopted: Yes  Problem Relation Age of Onset  . Cancer Mother 15       Bone   . Diabetes Mother   . Hypertension Sister   . Stroke Father 85  . Heart disease Father 54  . Diabetes Father   . Colon cancer Neg Hx    Social History   Socioeconomic History  . Marital status: Single    Spouse name: Not on file  . Number of children: 0  . Years of education: 80  . Highest education level: 12th grade  Occupational History  . Occupation: disabled   Social Needs  . Financial resource strain: Not hard at all  . Food insecurity:    Worry: Never true    Inability:  Never true  . Transportation needs:    Medical: No    Non-medical: No  Tobacco Use  . Smoking status: Current Every Day Smoker    Packs/day: 0.25    Years: 40.00    Pack years: 10.00    Types: Cigarettes  . Smokeless tobacco: Never Used  . Tobacco comment: 1/2 pk per day   Substance and Sexual Activity  . Alcohol use: Yes    Alcohol/week: 2.0 standard drinks    Types: 2 Cans of beer per week    Comment: 80 oz everyday  . Drug use: No  . Sexual activity: Yes    Birth control/protection: Condom  Lifestyle  . Physical activity:    Days per week: 7 days    Minutes per session: 60 min  . Stress: Not at all  Relationships  . Social connections:    Talks on phone: More than three times a week    Gets together: Never    Attends religious service: 1 to 4 times per year    Active member of club or organization: No      Attends meetings of clubs or organizations: Never    Relationship status: Never married  Other Topics Concern  . Not on file  Social History Narrative   Lives alone     Outpatient Encounter Medications as of 06/03/2018  Medication Sig  . allopurinol (ZYLOPRIM) 300 MG tablet Take 1 tablet (300 mg total) by mouth daily.  Marland Kitchen aspirin EC 81 MG tablet Take 81 mg by mouth daily.  . metoprolol tartrate (LOPRESSOR) 25 MG tablet Take 1 tablet (25 mg total) by mouth 2 (two) times daily.  . montelukast (SINGULAIR) 10 MG tablet Take 1 tablet (10 mg total) by mouth at bedtime.   No facility-administered encounter medications on file as of 06/03/2018.     Activities of Daily Living In your present state of health, do you have any difficulty performing the following activities: 06/03/2018 01/07/2018  Hearing? N N  Vision? Y N  Difficulty concentrating or making decisions? N N  Walking or climbing stairs? N N  Dressing or bathing? N N  Doing errands, shopping? N N  Preparing Food and eating ? N -  Using the Toilet? N -  In the past six months, have you accidently leaked urine? N -  Do you have problems with loss of bowel control? N -  Managing your Medications? N -  Managing your Finances? N -  Housekeeping or managing your Housekeeping? N -  Some recent data might be hidden    Patient Care Team: Fayrene Helper, MD as PCP - [facility], Alphonse Guild, MD as PCP - Cardiology (Cardiology)   Assessment:   This is a routine wellness examination for Wesley Brennan.  Exercise Activities and Dietary recommendations Current Exercise Habits: Home exercise routine, Type of exercise: walking;Other - see comments, Time (Minutes): 50, Frequency (Times/Week): 7, Weekly Exercise (Minutes/Week): 350, Intensity: Moderate  Goals    . Quit Smoking     Patient down one a day     . Reduce alcohol intake to 1 servings per day       Fall Risk Fall Risk  06/03/2018 04/24/2018 01/16/2018 10/30/2016 05/26/2016   Falls in the past year? 0 No No No No  Number falls in past yr: - - - - -  Injury with Fall? - - - - -  Risk Factor Category  - - - - -  Risk for fall due to : - - - - -  Is the patient's home free of loose throw rugs in walkways, pet beds, electrical cords, etc?   no      Grab bars in the bathroom? no      Handrails on the stairs?   yes      Adequate lighting?   yes  Timed Get Up and Go Performed: Patient able to perform in 5 seconds without assistance   Depression Screen PHQ 2/9 Scores 06/03/2018 04/24/2018 01/16/2018 10/30/2016  PHQ - 2 Score 0 0 3 0  PHQ- 9 Score - - 3 -    Cognitive Function     6CIT Screen 06/03/2018 10/30/2016  What Year? 4 points 0 points  What month? 0 points 0 points  What time? 0 points 0 points  Count back from 20 0 points 0 points  Months in reverse 4 points 0 points  Repeat phrase 0 points 0 points  Total Score 8 0    Immunization History  Administered Date(s) Administered  . H1N1 06/26/2008  . Influenza Split 05/01/2012  . Influenza Whole 04/11/2006  . Influenza,inj,Quad PF,6+ Mos 05/02/2013, 04/27/2014, 06/17/2015, 03/08/2016, 04/18/2017, 04/05/2018  . Pneumococcal Conjugate-13 09/09/2014  . Td 04/17/2006  . Tdap 05/26/2016    Qualifies for Shingles Vaccine? Postponed   Screening Tests Health Maintenance  Topic Date Due  . COLONOSCOPY  07/01/2025  . TETANUS/TDAP  05/26/2026  . INFLUENZA VACCINE  Completed  . Hepatitis C Screening  Completed  . HIV Screening  Completed   Cancer Screenings: Lung: Low Dose CT Chest recommended if Age 5-80 years, 30 pack-year currently smoking OR have quit w/in 15years. Patient does qualify. Colorectal: up to date   Additional Screenings:  Hepatitis C Screening: complete       Plan:   Continue to work on quitting smoking and reducing alcohol intake, continue to exercise   I have personally reviewed and noted the following in the patient's chart:   . Medical and social history . Use of  alcohol, tobacco or illicit drugs  . Current medications and supplements . Functional ability and status . Nutritional status . Physical activity . Advanced directives . List of other physicians . Hospitalizations, surgeries, and ER visits in previous 12 months . Vitals . Screenings to include cognitive, depression, and falls . Referrals and appointments  In addition, I have reviewed and discussed with patient certain preventive protocols, quality metrics, and best practice recommendations. A written personalized care plan for preventive services as well as general preventive health recommendations were provided to patient.     Hayden Pedro, LPN  [id number]

## 2018-06-03 NOTE — Addendum Note (Signed)
Addended by: Hayden Pedro on: 06/03/2018 11:00 AM   Modules accepted: Orders

## 2018-06-03 NOTE — Telephone Encounter (Signed)
Pls contact pt, needs MD follow up end March please schjhedule and let him know. Will also need fasting lipid, cmp and EGFr, and TSH and uric acid level 1 week before March visit, pls ordwer and mail order and let him know Dx hyperlipidemia, elevated LFT, hTN

## 2018-06-04 ENCOUNTER — Telehealth: Payer: Self-pay

## 2018-06-04 DIAGNOSIS — R748 Abnormal levels of other serum enzymes: Secondary | ICD-10-CM

## 2018-06-04 DIAGNOSIS — E785 Hyperlipidemia, unspecified: Secondary | ICD-10-CM

## 2018-06-04 DIAGNOSIS — E039 Hypothyroidism, unspecified: Secondary | ICD-10-CM

## 2018-06-04 DIAGNOSIS — I10 Essential (primary) hypertension: Secondary | ICD-10-CM

## 2018-06-04 DIAGNOSIS — M109 Gout, unspecified: Secondary | ICD-10-CM

## 2018-06-04 NOTE — Telephone Encounter (Signed)
Appointment made for End of March and labs ordered

## 2018-06-04 NOTE — Telephone Encounter (Signed)
Done

## 2018-06-25 ENCOUNTER — Encounter: Payer: Self-pay | Admitting: Gastroenterology

## 2018-07-15 DIAGNOSIS — E785 Hyperlipidemia, unspecified: Secondary | ICD-10-CM | POA: Diagnosis not present

## 2018-07-15 LAB — HEPATIC FUNCTION PANEL
AG RATIO: 1.5 (calc) (ref 1.0–2.5)
ALKALINE PHOSPHATASE (APISO): 80 U/L (ref 40–115)
ALT: 39 U/L (ref 9–46)
AST: 94 U/L — ABNORMAL HIGH (ref 10–35)
Albumin: 4.4 g/dL (ref 3.6–5.1)
Bilirubin, Direct: 0.1 mg/dL (ref 0.0–0.2)
GLOBULIN: 2.9 g/dL (ref 1.9–3.7)
Indirect Bilirubin: 0.8 mg/dL (calc) (ref 0.2–1.2)
Total Bilirubin: 0.9 mg/dL (ref 0.2–1.2)
Total Protein: 7.3 g/dL (ref 6.1–8.1)

## 2018-07-26 ENCOUNTER — Encounter: Payer: Self-pay | Admitting: *Deleted

## 2018-08-13 ENCOUNTER — Ambulatory Visit: Payer: Medicare Other

## 2018-08-18 ENCOUNTER — Other Ambulatory Visit: Payer: Self-pay

## 2018-08-18 ENCOUNTER — Emergency Department (HOSPITAL_COMMUNITY)
Admission: EM | Admit: 2018-08-18 | Discharge: 2018-08-18 | Disposition: A | Discharge status: Home / Self Care | Payer: Medicare HMO | Attending: Emergency Medicine | Admitting: Emergency Medicine

## 2018-08-18 ENCOUNTER — Encounter (HOSPITAL_COMMUNITY): Payer: Self-pay | Admitting: *Deleted

## 2018-08-18 DIAGNOSIS — Z7982 Long term (current) use of aspirin: Secondary | ICD-10-CM | POA: Diagnosis not present

## 2018-08-18 DIAGNOSIS — L089 Local infection of the skin and subcutaneous tissue, unspecified: Secondary | ICD-10-CM

## 2018-08-18 DIAGNOSIS — L03116 Cellulitis of left lower limb: Secondary | ICD-10-CM | POA: Insufficient documentation

## 2018-08-18 DIAGNOSIS — M79672 Pain in left foot: Secondary | ICD-10-CM | POA: Diagnosis not present

## 2018-08-18 DIAGNOSIS — Z79899 Other long term (current) drug therapy: Secondary | ICD-10-CM | POA: Insufficient documentation

## 2018-08-18 DIAGNOSIS — F17210 Nicotine dependence, cigarettes, uncomplicated: Secondary | ICD-10-CM | POA: Diagnosis not present

## 2018-08-18 DIAGNOSIS — I10 Essential (primary) hypertension: Secondary | ICD-10-CM | POA: Diagnosis not present

## 2018-08-18 MED ORDER — DOXYCYCLINE HYCLATE 100 MG PO CAPS: 100.0000 mg | ORAL_CAPSULE | Freq: Two times a day (BID) | ORAL | 0 refills | Status: DC

## 2018-08-18 MED ORDER — NAPROXEN 500 MG PO TABS: 500.0000 mg | ORAL_TABLET | Freq: Two times a day (BID) | ORAL | 0 refills | Status: DC

## 2018-08-18 MED ORDER — DOXYCYCLINE HYCLATE 100 MG PO TABS
100.0000 mg | ORAL_TABLET | Freq: Once | ORAL | Status: AC
  Administered 2018-08-18: 100 mg via ORAL
  Filled 2018-08-18: qty 1

## 2018-08-18 MED ORDER — COLCHICINE 0.6 MG PO TABS
0.6000 mg | ORAL_TABLET | Freq: Once | ORAL | Status: AC
  Administered 2018-08-18: 0.6 mg via ORAL
  Filled 2018-08-18: qty 1

## 2018-08-18 MED ORDER — CEPHALEXIN 500 MG PO CAPS
500.0000 mg | ORAL_CAPSULE | Freq: Once | ORAL | Status: AC
  Administered 2018-08-18: 500 mg via ORAL
  Filled 2018-08-18: qty 1

## 2018-08-18 MED ORDER — ACETAMINOPHEN ER 650 MG PO TBCR: 650.0000 mg | EXTENDED_RELEASE_TABLET | Freq: Three times a day (TID) | ORAL | 0 refills | Status: DC

## 2018-08-18 MED ORDER — CEPHALEXIN 500 MG PO CAPS: 500.0000 mg | ORAL_CAPSULE | Freq: Four times a day (QID) | ORAL | 0 refills | Status: DC

## 2018-08-18 MED ORDER — HYDROCODONE-ACETAMINOPHEN 5-325 MG PO TABS: 1.0000 | ORAL_TABLET | Freq: Three times a day (TID) | ORAL | 0 refills | Status: AC | PRN

## 2018-08-18 MED ORDER — COLCHICINE 0.6 MG PO TABS
1.2000 mg | ORAL_TABLET | Freq: Once | ORAL | Status: AC
  Administered 2018-08-18: 1.2 mg via ORAL
  Filled 2018-08-18: qty 2

## 2018-08-18 NOTE — ED Triage Notes (Signed)
Pt c/o left foot pain that started a week ago, worse with swelling last night,

## 2018-08-18 NOTE — Discharge Instructions (Addendum)
We saw in the ER for foot swelling. Although gout is possible, it appears to [place] that you might have an infection of the foot -and we recommend that you return to the ER in 2 days for recheck.

## 2018-08-18 NOTE — ED Notes (Signed)
ED Provider at bedside. 

## 2018-08-18 NOTE — ED Notes (Signed)
Marked swelling and redness with skin marker. Pt verbalizes understanding to return in 2 days for a recheck.

## 2018-08-18 NOTE — ED Provider Notes (Signed)
[facility] EMERGENCY DEPARTMENT Provider Note   CSN: [id number] Arrival date & time: 08/18/18  0542     History   Chief Complaint Chief Complaint  Patient presents with  . Foot Pain    HPI Wesley Brennan is a 58 y.o. male.  HPI  58 year old male with history of hypertension, hyperlipidemia and substance abuse comes in with chief complaint of foot pain.  Patient also has a history of gout, and states that he had gout attack in the same foot in the past.  Patient's current symptoms started 3-4 days ago.  He reports that last night his foot started swelling and now is having severe pain with ambulation.  He denies any nausea, vomiting, fevers, chills, trauma.  Pain appears similar or worse than his previous gout.  Past Medical History:  Diagnosis Date  . Gout   . Hypercholesteremia   . Hypertension    related to drug use which he has quit  . Substance abuse ([place])    alcohol, nicotine, h/o coacaine and marijuana    Patient Active Problem List   Diagnosis Date Noted  . Heel pain 04/24/2018  . Smoking greater than 30 pack years 04/24/2018  . Essential hypertension 01/06/2018  . Gout 01/06/2018  . Chest pain 01/06/2018  . Tobacco use 12/13/2016  . Left knee pain 12/13/2016  . Tubular adenoma of colon 07/08/2015  . Annual physical exam 02/01/2015  . Vision loss, bilateral 04/27/2014  . Allergic rhinitis 12/15/2013  . Hyperlipidemia LDL goal <100 06/26/2008  . Alcohol dependence ([place]) 06/26/2008  . NICOTINE ADDICTION 11/27/2007  . MENTAL RETARDATION 11/27/2007    Past Surgical History:  Procedure Laterality Date  . COLONOSCOPY N/A 07/02/2015   Procedure: COLONOSCOPY;  Surgeon: Danie Binder, MD;  Location[facility];  Service: Endoscopy;  Laterality: N/A;  1:00 PM  . ELBOW FUSION  1993   left elbow dislocation  . ELBOW FUSION Left 1979  . HERNIA REPAIR  9924 approx   umbilical hernia        Home Medications    Prior to Admission medications     Medication Sig Start Date End Date Taking? Authorizing Provider  acetaminophen (TYLENOL 8 HOUR) 650 MG CR tablet Take 1 tablet (650 mg total) by mouth every 8 (eight) hours. 08/18/18   Varney Biles, MD  allopurinol (ZYLOPRIM) 300 MG tablet Take 1 tablet (300 mg total) by mouth daily. 05/13/18   Fayrene Helper, MD  aspirin EC 81 MG tablet Take 81 mg by mouth daily.    [provider]  cephALEXin (KEFLEX) 500 MG capsule Take 1 capsule (500 mg total) by mouth 4 (four) times daily. 08/18/18   Varney Biles, MD  doxycycline (VIBRAMYCIN) 100 MG capsule Take 1 capsule (100 mg total) by mouth 2 (two) times daily. 08/18/18   Varney Biles, MD  HYDROcodone-acetaminophen (NORCO/VICODIN) 5-325 MG tablet Take 1 tablet by mouth every 8 (eight) hours as needed for up to 3 days for severe pain. 08/18/18 08/21/18  Varney Biles, MD  metoprolol tartrate (LOPRESSOR) 25 MG tablet Take 1 tablet (25 mg total) by mouth 2 (two) times daily. 01/07/18   Kathie Dike, MD  montelukast (SINGULAIR) 10 MG tablet Take 1 tablet (10 mg total) by mouth at bedtime. 10/17/17   Fayrene Helper, MD  naproxen (NAPROSYN) 500 MG tablet Take 1 tablet (500 mg total) by mouth 2 (two) times daily. 08/18/18   Varney Biles, MD    Family History Family History  Adopted:  Yes  Problem Relation Age of Onset  . Cancer Mother 67       Bone   . Diabetes Mother   . Hypertension Sister   . Stroke Father 33  . Heart disease Father 68  . Diabetes Father   . Colon cancer Neg Hx     Social History Social History   Tobacco Use  . Smoking status: Current Every Day Smoker    Packs/day: 0.25    Years: 40.00    Pack years: 10.00    Types: Cigarettes  . Smokeless tobacco: Never Used  . Tobacco comment: 1/2 pk per day   Substance Use Topics  . Alcohol use: Yes    Alcohol/week: 2.0 standard drinks    Types: 2 Cans of beer per week    Comment: 80 oz everyday  . Drug use: No     Allergies   Patient has no known  allergies.   Review of Systems Review of Systems  Constitutional: Positive for activity change. Negative for fever.  Gastrointestinal: Negative for nausea and vomiting.  Musculoskeletal: Positive for myalgias.  Skin: Positive for rash.  Allergic/Immunologic: Negative for immunocompromised state.     Physical Exam Updated Vital Signs BP (!) 144/80 (BP Location: Left Arm)   Pulse 69   Temp 99 F (37.2 C) (Oral)   Resp 18   Ht 5\' 11"  (1.803 m)   Wt 70.8 kg   SpO2 99%   BMI 21.76 kg/m   Physical Exam Vitals signs and nursing note reviewed.  Constitutional:      Appearance: He is well-developed.  HENT:     Head: Atraumatic.  Neck:     Musculoskeletal: Neck supple.  Cardiovascular:     Rate and Rhythm: Normal rate.  Pulmonary:     Effort: Pulmonary effort is normal.  Musculoskeletal:        General: Swelling and tenderness present.     Comments: Patient has gross swelling over the left foot in its entirety, just distal to the ankle.  There is no tenderness to palpation over the ankle and patient is able to plantar and dorsiflex.  There is calor.  The tenderness over the foot is worst over the lateral aspect of the midfoot.   Skin:    General: Skin is warm.     Findings: Erythema and rash present.  Neurological:     Mental Status: He is alert and oriented to person, place, and time.      ED Treatments / Results  Labs (all labs ordered are listed, but only abnormal results are displayed) Labs Reviewed - No data to display  EKG None  Radiology No results found.  Procedures Procedures (including critical care time)  Medications Ordered in ED Medications  colchicine tablet 1.2 mg (1.2 mg Oral Given 08/18/18 0272)  colchicine tablet 0.6 mg (0.6 mg Oral Given 08/18/18 0711)  cephALEXin (KEFLEX) capsule 500 mg (500 mg Oral Given 08/18/18 0733)  doxycycline (VIBRA-TABS) tablet 100 mg (100 mg Oral Given 08/18/18 0733)     Initial Impression / Assessment and Plan / ED  Course  I have reviewed the triage vital signs and the nursing notes.  Pertinent labs & imaging results that were available during my care of the patient were reviewed by me and considered in my medical decision making (see chart for details).     58 year old male comes in with chief complaint of foot pain.  He states that his been having pain over his foot for the  last 3 to 5 days, however the swelling started last night and now is having difficulty in walking.  He has history of gout.  On exam, it does not seem like he currently suffering to gout.  Primarily the MTP is not involved is the most tender region and the entire foot is swollen.  I do not think that the ankle is involved, as the range of motion over the ankle is intact and there is no gross tenderness to palpation of the ankle joint.  Goal is to treat patient with antibiotics for cellulitis.  He will also take NSAIDs for pain control in case there is some gout.  Patient was given colchicine here.  We have demarcated his erythematous and edematous rash and have requested that he comes back to the ER in 2 days for recheck or follows up with his PCP for recheck in 2 days.  Final Clinical Impressions(s) / ED Diagnoses   Final diagnoses:  Foot infection    ED Discharge Orders         Ordered    cephALEXin (KEFLEX) 500 MG capsule  4 times daily     08/18/18 0730    doxycycline (VIBRAMYCIN) 100 MG capsule  2 times daily     08/18/18 0730    HYDROcodone-acetaminophen (NORCO/VICODIN) 5-325 MG tablet  Every 8 hours PRN     08/18/18 0730    acetaminophen (TYLENOL 8 HOUR) 650 MG CR tablet  Every 8 hours     08/18/18 0730    naproxen (NAPROSYN) 500 MG tablet  2 times daily     08/18/18 0730           Varney Biles, MD 08/18/18 2321

## 2018-08-20 ENCOUNTER — Emergency Department (HOSPITAL_COMMUNITY)
Admission: EM | Admit: 2018-08-20 | Discharge: 2018-08-20 | Discharge status: Against Medical Advice | Payer: Medicare HMO

## 2018-09-09 ENCOUNTER — Encounter: Payer: Self-pay | Admitting: Nurse Practitioner

## 2018-09-09 ENCOUNTER — Ambulatory Visit (INDEPENDENT_AMBULATORY_CARE_PROVIDER_SITE_OTHER): Payer: Medicare HMO | Admitting: Nurse Practitioner

## 2018-09-09 DIAGNOSIS — Z86010 Personal history of colonic polyps: Secondary | ICD-10-CM | POA: Diagnosis not present

## 2018-09-09 NOTE — Patient Instructions (Signed)
Your health issues we discussed today were:   History of colon polyps: 1. Because of your previous colon polyps 3 years ago we will repeat your colonoscopy 2. Further recommendations will be made after your colonoscopy  Overall I recommend:  1. Follow-up based on recommendations made after your colonoscopy 2. Call us if you have any questions or concerns  At [facility] we value your feedback. You may receive a survey about your visit today. Please share your experience as we strive to create trusting relationships with our patients to provide genuine, compassionate, quality care.  We appreciate your understanding and patience as we review any laboratory studies, imaging, and other diagnostic tests that are ordered as we care for you. Our office policy is 5 business days for review of these results, and any emergent or urgent results are addressed in a timely manner for your best interest. If you do not hear from our office in 1 week, please contact us.   We also encourage the use of MyChart, which contains your medical information for your review as well. If you are not enrolled in this feature, an access code is on this after visit summary for your convenience. Thank you for allowing [place] to be involved in your care.  It was great to see you today!  I hope you have a great day!!

## 2018-09-09 NOTE — Assessment & Plan Note (Signed)
The patient has a history of sessile tubular adenoma polyps on last colonoscopy in 2016.  Recommended 3-year repeat (December 2019) and he is currently due.  Generally asymptomatic from a GI standpoint.  We will proceed with scheduling his colonoscopy at this time.  Proceed with colonoscopy on propofol/MAC with Dr. Oneida Alar in the near future. The risks, benefits, and alternatives have been discussed in detail with the patient. They state understanding and desire to proceed.   The patient currently drinks 40 ounces of beer a day, query possibility of more.  Denies recreational drug use.  No other anticoagulants, anxiolytics, chronic pain medications, or anti-depressants. We will plan for the procedure on propofol/MAC to promote adequate sedation due to daily alcohol consumption.

## 2018-09-09 NOTE — Progress Notes (Addendum)
REVIEWED-NO ADDITIONAL RECOMMENDATIONS.  Primary Care Physician:  Fayrene Helper, MD Primary Gastroenterologist:  Dr. Oneida Alar  Chief Complaint  Patient presents with  . Consult    TCS. Last done 2017    HPI:   Wesley Brennan is a 58 y.o. male who presents for repeat colonoscopy.  He was brought in for an office visit due to alcohol consumption likely necessitating augmented sedation.  Previous colonoscopy completed 07/02/2015 which found four sessile polyps between 3 to 4 mm in size.  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  Recommended repeat colonoscopy in 3 years (2019).   Today he states he's doing well overall. Denies abdominal pain, N/V, hematochezia, melena, fever, chills, unintentional weight loss. Has intermittent chest pain that he attributes to gas pain, was worked up by cardiology and states he had a normal stress test. Denies chest pain, dyspnea, dizziness, lightheadedness, syncope, near syncope. Denies any other upper or lower GI symptoms.  Past Medical History:  Diagnosis Date  . Gout   . Hypercholesteremia   . Hypertension    related to drug use which he has quit  . Substance abuse ([place])    alcohol, nicotine, h/o coacaine and marijuana    Past Surgical History:  Procedure Laterality Date  . COLONOSCOPY N/A 07/02/2015   Procedure: COLONOSCOPY;  Surgeon: Danie Binder, MD;  Location: [facility];  Service: Endoscopy;  Laterality: N/A;  1:00 PM  . ELBOW FUSION  1993   left elbow dislocation  . ELBOW FUSION Left 1979  . HERNIA REPAIR  4431 approx   umbilical hernia    Current Outpatient Medications  Medication Sig Dispense Refill  . aspirin EC 81 MG tablet Take 81 mg by mouth daily.    Marland Kitchen acetaminophen (TYLENOL 8 HOUR) 650 MG CR tablet Take 1 tablet (650 mg total) by mouth every 8 (eight) hours. (Patient not taking: Reported on 09/09/2018) 30 tablet 0  . allopurinol (ZYLOPRIM) 300 MG tablet Take 1 tablet (300 mg total) by mouth daily.  (Patient not taking: Reported on 09/09/2018) 30 tablet 6  . cephALEXin (KEFLEX) 500 MG capsule Take 1 capsule (500 mg total) by mouth 4 (four) times daily. (Patient not taking: Reported on 09/09/2018) 28 capsule 0  . doxycycline (VIBRAMYCIN) 100 MG capsule Take 1 capsule (100 mg total) by mouth 2 (two) times daily. (Patient not taking: Reported on 09/09/2018) 14 capsule 0  . metoprolol tartrate (LOPRESSOR) 25 MG tablet Take 1 tablet (25 mg total) by mouth 2 (two) times daily. (Patient not taking: Reported on 09/09/2018) 60 tablet 0  . montelukast (SINGULAIR) 10 MG tablet Take 1 tablet (10 mg total) by mouth at bedtime. (Patient not taking: Reported on 09/09/2018) 30 tablet 3  . naproxen (NAPROSYN) 500 MG tablet Take 1 tablet (500 mg total) by mouth 2 (two) times daily. (Patient not taking: Reported on 09/09/2018) 10 tablet 0   No current facility-administered medications for this visit.     Allergies as of 09/09/2018  . (No Known Allergies)    Family History  Adopted: Yes  Problem Relation Age of Onset  . Cancer Mother 56       Bone   . Diabetes Mother   . Hypertension Sister   . Stroke Father 65  . Heart disease Father 73  . Diabetes Father   . Colon cancer Neg Hx     Social History   Socioeconomic History  . Marital status: Single    Spouse name: Not on  file  . Number of children: 0  . Years of education: 5  . Highest education level: 12th grade  Occupational History  . Occupation: disabled   Social Needs  . Financial resource strain: Not hard at all  . Food insecurity:    Worry: Never true    Inability: Never true  . Transportation needs:    Medical: No    Non-medical: No  Tobacco Use  . Smoking status: Current Every Day Smoker    Packs/day: 0.25    Years: 40.00    Pack years: 10.00    Types: Cigarettes  . Smokeless tobacco: Never Used  . Tobacco comment: 1/2 pk per day   Substance and Sexual Activity  . Alcohol use: Yes    Comment: 40oz daily  . Drug use: No  .  Sexual activity: Yes    Birth control/protection: Condom  Lifestyle  . Physical activity:    Days per week: 7 days    Minutes per session: 60 min  . Stress: Not at all  Relationships  . Social connections:    Talks on phone: More than three times a week    Gets together: Never    Attends religious service: 1 to 4 times per year    Active member of club or organization: No    Attends meetings of clubs or organizations: Never    Relationship status: Never married  . Intimate partner violence:    Fear of current or ex partner: No    Emotionally abused: No    Physically abused: No    Forced sexual activity: No  Other Topics Concern  . Not on file  Social History Narrative   Lives alone     Review of Systems: Complete ROS negative except as per HPI.    Physical Exam: BP (!) 154/89   Pulse 78   Temp (!) 97 F (36.1 C) (Oral)   Ht 5\' 11"  (1.803 m)   Wt 167 lb 6.4 oz (75.9 kg)   BMI 23.35 kg/m  General:   Alert and oriented. Pleasant and cooperative. Well-nourished and well-developed.  Eyes:  Without icterus, sclera clear and conjunctiva pink.  Ears:  Normal auditory acuity. Cardiovascular:  S1, S2 present without murmurs appreciated. Extremities without clubbing or edema. Respiratory:  Clear to auscultation bilaterally. No wheezes, rales, or rhonchi. No distress.  Gastrointestinal:  +BS, soft, non-tender and non-distended. No HSM noted. No guarding or rebound. No masses appreciated.  Rectal:  Deferred  Musculoskalatal:  Symmetrical without gross deformities.  Neurologic:  Alert and oriented x4;  grossly normal neurologically. Psych:  Alert and cooperative. Normal mood and affect. Heme/Lymph/Immune: No excessive bruising noted.    09/09/2018 8:53 AM   Disclaimer: This note was dictated with voice recognition software. Similar sounding words can inadvertently be transcribed and may not be corrected upon review.

## 2018-09-09 NOTE — Progress Notes (Signed)
CC'D TO PCP °

## 2018-09-11 ENCOUNTER — Telehealth: Payer: Self-pay | Admitting: *Deleted

## 2018-09-11 ENCOUNTER — Telehealth: Payer: Self-pay | Admitting: Gastroenterology

## 2018-09-11 NOTE — Telephone Encounter (Addendum)
LMOVM to schedule TCS w/ MAC w/ SLF. Dx hx polyps

## 2018-09-11 NOTE — Telephone Encounter (Signed)
Pt was returning a call. [phone]

## 2018-09-11 NOTE — Telephone Encounter (Signed)
Spoke states he is going to have to discuss with his sister

## 2018-09-11 NOTE — Telephone Encounter (Signed)
Please call patient back

## 2018-09-12 NOTE — Telephone Encounter (Signed)
Letter mailed

## 2018-09-18 ENCOUNTER — Other Ambulatory Visit: Payer: Self-pay

## 2018-09-18 ENCOUNTER — Telehealth: Payer: Self-pay | Admitting: Gastroenterology

## 2018-09-18 DIAGNOSIS — Z86010 Personal history of colonic polyps: Secondary | ICD-10-CM

## 2018-09-18 MED ORDER — PEG 3350-KCL-NA BICARB-NACL 420 G PO SOLR: 4000.0000 mL | ORAL | 0 refills | Status: DC

## 2018-09-18 NOTE — Telephone Encounter (Signed)
Called pt, TCS w/Propofol w/SLF scheduled for 12/31/18 at 8:30am. Rx for prep sent to pharmacy. Orders entered.

## 2018-09-18 NOTE — Telephone Encounter (Signed)
Called and informed pt of pre-op appt 12/24/18 at 12:45pm. Letter mailed with procedure instructions.

## 2018-09-18 NOTE — Telephone Encounter (Signed)
Pt called asking to schedule his colonoscopy on a Wednesday in April while his sister was available to help him. Please call [phone]

## 2018-10-08 ENCOUNTER — Encounter: Payer: Medicare Other | Admitting: Family Medicine

## 2018-10-28 ENCOUNTER — Other Ambulatory Visit: Payer: Self-pay

## 2018-10-28 ENCOUNTER — Encounter: Payer: Self-pay | Admitting: Family Medicine

## 2018-10-28 ENCOUNTER — Ambulatory Visit (INDEPENDENT_AMBULATORY_CARE_PROVIDER_SITE_OTHER): Payer: Medicare HMO | Admitting: Family Medicine

## 2018-10-28 DIAGNOSIS — M1A0710 Idiopathic chronic gout, right ankle and foot, without tophus (tophi): Secondary | ICD-10-CM | POA: Diagnosis not present

## 2018-10-28 DIAGNOSIS — M1990 Unspecified osteoarthritis, unspecified site: Secondary | ICD-10-CM | POA: Diagnosis not present

## 2018-10-28 MED ORDER — ACETAMINOPHEN 500 MG PO TABS: 500.0000 mg | ORAL_TABLET | Freq: Four times a day (QID) | ORAL | 0 refills | Status: DC | PRN

## 2018-10-28 MED ORDER — ALLOPURINOL 300 MG PO TABS: 300.0000 mg | ORAL_TABLET | Freq: Every day | ORAL | 6 refills | Status: DC

## 2018-10-28 NOTE — Progress Notes (Signed)
Virtual Visit via Telephone Note  I connected with Wesley Brennan on 10/28/18 at 10:00 AM EDT by telephone and verified that I am speaking with the correct person using two identifiers.   I discussed the limitations, risks, security and privacy concerns of performing an evaluation and management service by telephone. The patient expressed understanding and agreed to proceed.  History of Present Illness: 58 year old male with history of hypertension, hyperlipidemia,gout and substance abuse has telemedicine visit today secondary to gout pain in the Right foot. He was seen in the ED back on 08/18/2018 and treated for questionable cellulitis, which he thought was gout related. He was treated with colchicine doses while in the ED.   He was started on Doxy and Keflex, as well as norco and naproxen, and tylenol.  Today he calls has his Right foot pain had started back up on Friday. He did not take his medications as previously directed, as he reported he restarted taking the antibiotics and allopurinol. And now needs refills.   Today he reports no pain in the foot.  He denies having any difficulty ambulating or walking.  Denies having a fever, chills, chest pain, shortness of breath, cough, wheezing.  Endorses that he continues to go out and about and prays that he does not get the virus.  He has not been staying at home as really advised.  Is having any recent contacts with anybody that is been sick.  Past Medical, Surgical, Social History, Allergies, and Medications have been Reviewed.  Review of Systems  Constitutional: Negative.   HENT: Negative.   Eyes: Negative.   Respiratory: Negative.   Cardiovascular: Negative.   Gastrointestinal: Negative.   Genitourinary: Negative.   Musculoskeletal: Negative.   Skin: Negative.   Neurological: Negative.   Endo/Heme/Allergies: Negative.   Psychiatric/Behavioral: Negative.   All other systems reviewed and are  negative.  Observations/Objective: Physical Exam  Constitutional: He is oriented to person, place, and time.  Neurological: He is oriented to person, place, and time.  Psychiatric: Mood, memory and affect normal.     Assessment and Plan: 1. Arthritis Reports arthritis is acting up.  Would like refill for Tylenol.  Provided at this time. Patient acknowledged agreement and understanding of the plan.   Reviewed side effects, risks and benefits of medication.    - acetaminophen (TYLENOL) 500 MG tablet; Take 1 tablet (500 mg total) by mouth every 6 (six) hours as needed for moderate pain.  Dispense: 30 tablet; Refill: 0  2. Chronic gout of right foot, unspecified cause Reports that he restarted his allopurinol on Friday and that seems to have been helping his foot pain.  He denies having any foot pain by the time that he had in his appointment today.  Will prescribe him with a refill of the allopurinol as well as some Tylenol for pain.   Advised that patient needed to have completed the antibiotic when it was given to him at the time so that he would clear his foot infection fully.  Advised that he would not be able to get another refill of antibiotic at this time.  Patient was confused as to how he was supposed to be properly taking the antibiotic.  Reeducated on the need to complete antibiotics when they are first prescribed.  Educated him on the use of allopurinol to help with gout.  If his symptoms reoccur or worsen he is advised to contact the office again.  Patient acknowledged agreement and understanding of the plan.  Reviewed side effects, risks and benefits of medication.  - acetaminophen (TYLENOL) 500 MG tablet; Take 1 tablet (500 mg total) by mouth every 6 (six) hours as needed for moderate pain.  Dispense: 30 tablet; Refill: 0 - allopurinol (ZYLOPRIM) 300 MG tablet; Take 1 tablet (300 mg total) by mouth daily.  Dispense: 30 tablet; Refill: 6  Follow Up Instructions: AVS printed  and mailed   I discussed the assessment and treatment plan with the patient. The patient was provided an opportunity to ask questions and all were answered. The patient agreed with the plan and demonstrated an understanding of the instructions.   The patient was advised to call back or seek an in-person evaluation if the symptoms worsen or if the condition fails to improve as anticipated.  I provided 15 minutes of non-face-to-face time during this encounter.   Perlie Mayo, NP

## 2018-10-28 NOTE — Patient Instructions (Signed)
Thank you for coming into the office today. I appreciate the opportunity to provide you with the care for your health and wellness. Today we discussed: gout, medication use, and need to stay home for safety. I have sent in Tylenol and your allopurinol to [facility].  I have included some information on gout for you to follow-up on and reviewed. Also please stay home to avoid this virus.  As well as to promote safety for everyone else. Take care Gout  Gout is painful swelling of your joints. Gout is a type of arthritis. It is caused by having too much uric acid in your body. Uric acid is a chemical that is made when your body breaks down substances called purines. If your body has too much uric acid, sharp crystals can form and build up in your joints. This causes pain and swelling. Gout attacks can happen quickly and be very painful (acute gout). Over time, the attacks can affect more joints and happen more often (chronic gout). What are the causes?  Too much uric acid in your blood. This can happen because: ? Your kidneys do not remove enough uric acid from your blood. ? Your body makes too much uric acid. ? You eat too many foods that are high in purines. These foods include organ meats, some seafood, and beer.  Trauma or stress. What increases the risk?  Having a family history of gout.  Being male and middle-aged.  Being male and having gone through menopause.  Being very overweight (obese).  Drinking alcohol, especially beer.  Not having enough water in the body (being dehydrated).  Losing weight too quickly.  Having an organ transplant.  Having lead poisoning.  Taking certain medicines.  Having kidney disease.  Having a skin condition called psoriasis. What are the signs or symptoms? An attack of acute gout usually happens in just one joint. The most common place is the big toe. Attacks often start at night. Other joints that may be affected include  joints of the feet, ankle, knee, fingers, wrist, or elbow. Symptoms of an attack may include:  Very bad pain.  Warmth.  Swelling.  Stiffness.  Shiny, red, or purple skin.  Tenderness. The affected joint may be very painful to touch.  Chills and fever. Chronic gout may cause symptoms more often. More joints may be involved. You may also have white or yellow lumps (tophi) on your hands or feet or in other areas near your joints. How is this treated?  Treatment for this condition has two phases: treating an acute attack and preventing future attacks.  Acute gout treatment may include: ? NSAIDs. ? Steroids. These are taken by mouth or injected into a joint. ? Colchicine. This medicine relieves pain and swelling. It can be given by mouth or through an IV tube.  Preventive treatment may include: ? Taking small doses of NSAIDs or colchicine daily. ? Using a medicine that reduces uric acid levels in your blood. ? Making changes to your diet. You may need to see a food expert (dietitian) about what to eat and drink to prevent gout. Follow these instructions at home: During a gout attack   If told, put ice on the painful area: ? Put ice in a plastic bag. ? Place a towel between your skin and the bag. ? Leave the ice on for 20 minutes, 2-3 times a day.  Raise (elevate) the painful joint above the level of your heart as often as you can.  Rest the joint as much as possible. If the joint is in your leg, you may be given crutches.  Follow instructions from your doctor about what you cannot eat or drink. Avoiding future gout attacks  Eat a low-purine diet. Avoid foods and drinks such as: ? Liver. ? Kidney. ? Anchovies. ? Asparagus. ? Herring. ? Mushrooms. ? Mussels. ? Beer.  Stay at a healthy weight. If you want to lose weight, talk with your doctor. Do not lose weight too fast.  Start or continue an exercise plan as told by your doctor. Eating and drinking  Drink enough  fluids to keep your pee (urine) pale yellow.  If you drink alcohol: ? Limit how much you use to:  0-1 drink a day for women.  0-2 drinks a day for men. ? Be aware of how much alcohol is in your drink. In the [place] one drink equals one 12 oz bottle of beer (355 mL), one 5 oz glass of wine (148 mL), or one 1 oz glass of hard liquor (44 mL). General instructions  Take over-the-counter and prescription medicines only as told by your doctor.  Do not drive or use heavy machinery while taking prescription pain medicine.  Return to your normal activities as told by your doctor. Ask your doctor what activities are safe for you.  Keep all follow-up visits as told by your doctor. This is important. Contact a doctor if:  You have another gout attack.  You still have symptoms of a gout attack after 10 days of treatment.  You have problems (side effects) because of your medicines.  You have chills or a fever.  You have burning pain when you pee (urinate).  You have pain in your lower back or belly. Get help right away if:  You have very bad pain.  Your pain cannot be controlled.  You cannot pee. Summary  Gout is painful swelling of the joints.  The most common site of pain is the big toe, but it can affect other joints.  Medicines and avoiding some foods can help to prevent and treat gout attacks. This information is not intended to replace advice given to you by your health care provider. Make sure you discuss any questions you have with your health care provider. Document Released: 04/11/2008 Document Revised: 01/23/2018 Document Reviewed: 01/23/2018 Elsevier Interactive Patient Education  2019 [place] YOUR HANDS WELL AND FREQUENTLY. AVOID TOUCHING YOUR FACE, UNLESS YOUR HANDS ARE FRESHLY WASHED.  GET FRESH AIR DAILY. STAY HYDRATED WITH WATER.   It was a pleasure to see you and I look forward to continuing to work together on your health and well-being.  Please do not hesitate to call the office if you need care or have questions about your care.  Have a wonderful day and week. With Gratitude, Cherly Beach, DNP, AGNP-BC

## 2018-11-15 ENCOUNTER — Encounter: Payer: Self-pay | Admitting: *Deleted

## 2018-11-20 ENCOUNTER — Encounter: Payer: Self-pay | Admitting: *Deleted

## 2018-11-28 ENCOUNTER — Encounter: Payer: Medicare HMO | Admitting: Family Medicine

## 2018-12-19 ENCOUNTER — Telehealth: Payer: Self-pay

## 2018-12-19 NOTE — Telephone Encounter (Signed)
Pt came by office requesting to reschedule TCS w/Propofol w/SLF that is for 12/31/18. Epic was down at the time.   Called pt, gave him available dates. He will call his sister and call office back.

## 2018-12-20 ENCOUNTER — Other Ambulatory Visit: Payer: Self-pay

## 2018-12-20 MED ORDER — PEG 3350-KCL-NA BICARB-NACL 420 G PO SOLR: 4000.0000 mL | ORAL | 0 refills | Status: DC

## 2018-12-20 NOTE — Telephone Encounter (Signed)
Pt called office and LMOVM. Called pt back, TCS rescheduled to 03/27/19 at 9:30am. Resent rx for prep to pharmacy. LMOVM for endo scheduler.

## 2018-12-20 NOTE — Telephone Encounter (Signed)
Pre-op appt 03/25/19 at 11:00am. Letter mailed with procedure instructions.

## 2018-12-24 ENCOUNTER — Other Ambulatory Visit (HOSPITAL_COMMUNITY): Payer: Medicare HMO

## 2019-01-08 ENCOUNTER — Other Ambulatory Visit: Payer: Self-pay

## 2019-01-08 ENCOUNTER — Encounter (INDEPENDENT_AMBULATORY_CARE_PROVIDER_SITE_OTHER): Payer: Self-pay

## 2019-01-08 ENCOUNTER — Encounter: Payer: Self-pay | Admitting: Family Medicine

## 2019-01-08 ENCOUNTER — Ambulatory Visit (INDEPENDENT_AMBULATORY_CARE_PROVIDER_SITE_OTHER): Payer: Medicare HMO | Admitting: Family Medicine

## 2019-01-08 VITALS — BP 130/82 | HR 77 | Resp 12 | Ht 71.0 in | Wt 144.1 lb

## 2019-01-08 DIAGNOSIS — F17210 Nicotine dependence, cigarettes, uncomplicated: Secondary | ICD-10-CM

## 2019-01-08 DIAGNOSIS — R740 Nonspecific elevation of levels of transaminase and lactic acid dehydrogenase [LDH]: Secondary | ICD-10-CM

## 2019-01-08 DIAGNOSIS — Z0000 Encounter for general adult medical examination without abnormal findings: Secondary | ICD-10-CM | POA: Diagnosis not present

## 2019-01-08 DIAGNOSIS — I10 Essential (primary) hypertension: Secondary | ICD-10-CM | POA: Diagnosis not present

## 2019-01-08 DIAGNOSIS — Z720 Tobacco use: Secondary | ICD-10-CM | POA: Diagnosis not present

## 2019-01-08 DIAGNOSIS — E790 Hyperuricemia without signs of inflammatory arthritis and tophaceous disease: Secondary | ICD-10-CM | POA: Diagnosis not present

## 2019-01-08 DIAGNOSIS — F10229 Alcohol dependence with intoxication, unspecified: Secondary | ICD-10-CM

## 2019-01-08 DIAGNOSIS — R7301 Impaired fasting glucose: Secondary | ICD-10-CM

## 2019-01-08 DIAGNOSIS — E785 Hyperlipidemia, unspecified: Secondary | ICD-10-CM

## 2019-01-08 DIAGNOSIS — R7401 Elevation of levels of liver transaminase levels: Secondary | ICD-10-CM

## 2019-01-08 NOTE — Patient Instructions (Addendum)
Keep wellness appointment and flu vaccine at that visit  F/U with MD first week in January, 8 am appt, call if you need me befopore Quit date for smoking is August 10, congrats down tyo 1/day with gum, so go for the wiN1  Reduce beer to 8 ounces per day, aiming to stop  Labs  today fasting same as ordered in Nov 2019  Please get shingles vaccines at your pharmacy, recommended  Thanks for choosing [facility], we consider it a privelige to serve you.

## 2019-01-09 ENCOUNTER — Other Ambulatory Visit: Payer: Self-pay | Admitting: Family Medicine

## 2019-01-09 DIAGNOSIS — R7989 Other specified abnormal findings of blood chemistry: Secondary | ICD-10-CM

## 2019-01-09 LAB — LIPID PANEL
Cholesterol: 354 mg/dL — ABNORMAL HIGH (ref <200)
HDL: 36 mg/dL — ABNORMAL LOW (ref >=40)
Non-HDL Cholesterol (Calc): 318 mg/dL (calc) — ABNORMAL HIGH (ref <130)
Total CHOL/HDL Ratio: 9.8 (calc) — ABNORMAL HIGH (ref <5.0)
Triglycerides: 1658 mg/dL — ABNORMAL HIGH (ref <150)

## 2019-01-09 LAB — COMPLETE METABOLIC PANEL WITH GFR
AG Ratio: 1.4 (calc) (ref 1.0–2.5)
ALT: 104 U/L — ABNORMAL HIGH (ref 9–46)
AST: 191 U/L — ABNORMAL HIGH (ref 10–35)
Albumin: 4.2 g/dL (ref 3.6–5.1)
Alkaline phosphatase (APISO): 96 U/L (ref 35–144)
BUN: 22 mg/dL (ref 7–25)
CO2: 26 mmol/L (ref 20–32)
Calcium: 9.7 mg/dL (ref 8.6–10.3)
Chloride: 103 mmol/L (ref 98–110)
Creat: 1.08 mg/dL (ref 0.70–1.25)
GFR, Est African American: 86 mL/min/{1.73_m2} (ref >=60)
GFR, Est Non African American: 74 mL/min/{1.73_m2} (ref >=60)
Globulin: 3 g/dL (calc) (ref 1.9–3.7)
Glucose, Bld: 109 mg/dL — ABNORMAL HIGH (ref 65–99)
Potassium: 4.4 mmol/L (ref 3.5–5.3)
Sodium: 137 mmol/L (ref 135–146)
Total Bilirubin: 1.9 mg/dL — ABNORMAL HIGH (ref 0.2–1.2)
Total Protein: 7.2 g/dL (ref 6.1–8.1)

## 2019-01-09 LAB — TSH: TSH: 1.19 mIU/L (ref 0.40–4.50)

## 2019-01-09 LAB — URIC ACID: Uric Acid, Serum: 8.8 mg/dL — ABNORMAL HIGH (ref 4.0–8.0)

## 2019-01-09 MED ORDER — EZETIMIBE 10 MG PO TABS: 10.0000 mg | ORAL_TABLET | Freq: Every day | ORAL | 5 refills | Status: DC

## 2019-01-09 NOTE — Progress Notes (Unsigned)
amb gastro  

## 2019-01-10 ENCOUNTER — Other Ambulatory Visit: Payer: Self-pay

## 2019-01-10 DIAGNOSIS — M1A0710 Idiopathic chronic gout, right ankle and foot, without tophus (tophi): Secondary | ICD-10-CM

## 2019-01-10 MED ORDER — ALLOPURINOL 300 MG PO TABS: 300.0000 mg | ORAL_TABLET | Freq: Every day | ORAL | 6 refills | Status: DC

## 2019-01-10 MED ORDER — EZETIMIBE 10 MG PO TABS: 10.0000 mg | ORAL_TABLET | Freq: Every day | ORAL | 6 refills | Status: DC

## 2019-01-10 NOTE — Progress Notes (Signed)
Labs ordered and sent to pt

## 2019-01-12 ENCOUNTER — Encounter: Payer: Self-pay | Admitting: Family Medicine

## 2019-01-12 DIAGNOSIS — R7401 Elevation of levels of liver transaminase levels: Secondary | ICD-10-CM | POA: Insufficient documentation

## 2019-01-12 NOTE — Assessment & Plan Note (Signed)
Annual exam as documented. Counseling done  re healthy lifestyle involving commitment to 150 minutes exercise per week, heart healthy diet, and attaining healthy weight.The importance of adequate sleep also discussed. Changes in health habits are decided on by the patient with goals and time frames  set for achieving them. Immunization and cancer screening needs are specifically addressed at this visit. 

## 2019-01-12 NOTE — Assessment & Plan Note (Signed)
Encouraged to join AA group, states he has reduced usage a lot and is nearly quit, unfortunately liver enzymes  Are higher than they have been in years on review

## 2019-01-12 NOTE — Assessment & Plan Note (Signed)
Asked:confirms currently smokes cigarettes Assess: Unwilling to quit but cutting back Advise: needs to QUIT to reduce risk of cancer, cardio and cerebrovascular disease Assist: counseled for 5 minutes and literature provided Arrange: follow up in 3 months  

## 2019-01-12 NOTE — Progress Notes (Signed)
DRAYTON TIEU     MRN: [id number]      DOB: 1960/12/21   HPI: Patient is in for annual physical exam. Counseled and  Encouraged to quit both nicotine nd alcohol use , states he is working on both and has significantly reduced dependence on both Labs are obtained on the day of the visit, and review shows markedly abnormal liver function tests which require ultrasound liver and GI eval Also marked hyperlipidemia present , he will need medication and is limited by the medication he is actually able to take  Immunization is reviewed .    PE; BP 130/82   Pulse 77   Resp 12   Ht 5\' 11"  (1.803 m)   Wt 144 lb 1.9 oz (65.4 kg)   SpO2 97%   BMI 20.10 kg/m   Pleasant male, alert and oriented x 3, in no cardio-pulmonary distress. Afebrile. HEENT No facial trauma or asymetry. Sinuses non tender. EOMI,. External ears normal,  Oropharynx moist, no exudate.Poor dentition Neck: supple, no adenopathy,JVD or thyromegaly.No bruits.  Chest: Clear to ascultation bilaterally.No crackles or wheezes.Decreased air entry bilaterally Non tender to palpation    Cardiovascular system; Heart sounds normal,  S1 and  S2 ,no S3.  No murmur, or thrill. Apical beat not displaced Peripheral pulses normal.  Abdomen: Soft, non tender, No guarding, tenderness or rebound.    Musculoskeletal exam: Full ROM of spine, hips ,  and knees.  deformity , noted.of left upper extremity  muscle wasting ,atrophy and contracture of LUE   Neurologic: Cranial nerves 2 to 12 intact. Power, tone ,sensation  normal throughout. No disturbance in gait. No tremor.  Skin: Intact, no ulceration, erythema , scaling or rash noted. Pigmentation normal throughout  Psych; Normal mood and affect.   Assessment & Plan:  Annual physical exam Annual exam as documented. Counseling done  re healthy lifestyle involving commitment to 150 minutes exercise per week, heart healthy diet, and attaining healthy weight.The  importance of adequate sleep also discussed. . Changes in health habits are decided on by the patient with goals and time frames  set for achieving them. Immunization and cancer screening needs are specifically addressed at this visit.   Tobacco use Asked:confirms currently smokes cigarettes Assess: Unwilling to quit but cutting back Advise: needs to QUIT to reduce risk of cancer, cardio and cerebrovascular disease Assist: counseled for 5 minutes and literature provided Arrange: follow up in 3 months   Alcohol dependence ([place]) Encouraged to join AA group, states he has reduced usage a lot and is nearly quit, unfortunately liver enzymes  Are higher than they have been in years on review  Hyperlipidemia LDL goal <100 Hyperlipidemia:Low fat diet discussed and encouraged.   Lipid Panel  Lab Results  Component Value Date   CHOL 354 (H) 01/08/2019   HDL 36 (L) 01/08/2019   [place]  01/08/2019     Comment:     . LDL cholesterol not calculated. Triglyceride levels greater than 400 mg/dL invalidate calculated LDL results. . Reference range: <100 . Desirable range <100 mg/dL for primary prevention;   <70 mg/dL for patients with CHD or diabetic patients  with > or = 2 CHD risk factors. Marland Kitchen LDL-C is now calculated using the Martin-Hopkins  calculation, which is a validated novel method providing  better accuracy than the Friedewald equation in the  estimation of LDL-C.  Cresenciano Genre et al. Annamaria Helling. 4098;119(14): 2061-2068  (http://education.[facility].com/faq/FAQ164)    TRIG 1,658 (H) 01/08/2019   CHOLHDL 9.8 (  H) 01/08/2019   Advised to lower fat intake and is started on zetia    Transaminitis Markedly and elevated AST and ALT, likely alcohol induced, need RUQ ultrasound, hepatitis panel and GI re eval

## 2019-01-12 NOTE — Assessment & Plan Note (Signed)
Hyperlipidemia:Low fat diet discussed and encouraged.   Lipid Panel  Lab Results  Component Value Date   CHOL 354 (H) 01/08/2019   HDL 36 (L) 01/08/2019   [place]  01/08/2019     Comment:     . LDL cholesterol not calculated. Triglyceride levels greater than 400 mg/dL invalidate calculated LDL results. . Reference range: <100 . Desirable range <100 mg/dL for primary prevention;   <70 mg/dL for patients with CHD or diabetic patients  with > or = 2 CHD risk factors. Marland Kitchen LDL-C is now calculated using the Martin-Hopkins  calculation, which is a validated novel method providing  better accuracy than the Friedewald equation in the  estimation of LDL-C.  Cresenciano Genre et al. Annamaria Helling. 3073;543(01): 2061-2068  (http://education.[facility].com/faq/FAQ164)    TRIG 1,658 (H) 01/08/2019   CHOLHDL 9.8 (H) 01/08/2019   Advised to lower fat intake and is started on zetia

## 2019-01-12 NOTE — Assessment & Plan Note (Signed)
Markedly and elevated AST and ALT, likely alcohol induced, need RUQ ultrasound, hepatitis panel and GI re eval

## 2019-01-13 ENCOUNTER — Encounter: Payer: Self-pay | Admitting: Gastroenterology

## 2019-01-15 ENCOUNTER — Other Ambulatory Visit: Payer: Self-pay

## 2019-01-15 ENCOUNTER — Ambulatory Visit (HOSPITAL_COMMUNITY)
Admission: RE | Admit: 2019-01-15 | Discharge: 2019-01-15 | Disposition: A | Discharge status: Home / Self Care | Payer: Medicare HMO | Source: Ambulatory Visit | Attending: Family Medicine | Admitting: Family Medicine

## 2019-01-15 DIAGNOSIS — R945 Abnormal results of liver function studies: Secondary | ICD-10-CM | POA: Diagnosis not present

## 2019-01-15 DIAGNOSIS — R7989 Other specified abnormal findings of blood chemistry: Secondary | ICD-10-CM

## 2019-01-20 ENCOUNTER — Encounter: Payer: Self-pay | Admitting: Family Medicine

## 2019-02-21 ENCOUNTER — Encounter: Payer: Self-pay | Admitting: Family Medicine

## 2019-02-21 ENCOUNTER — Encounter (INDEPENDENT_AMBULATORY_CARE_PROVIDER_SITE_OTHER): Payer: Self-pay

## 2019-02-21 ENCOUNTER — Other Ambulatory Visit: Payer: Self-pay

## 2019-02-21 ENCOUNTER — Ambulatory Visit (INDEPENDENT_AMBULATORY_CARE_PROVIDER_SITE_OTHER): Payer: Medicare HMO | Admitting: Family Medicine

## 2019-02-21 VITALS — BP 150/90 | HR 83 | Temp 98.9°F | Resp 12 | Ht 71.0 in | Wt 150.0 lb

## 2019-02-21 DIAGNOSIS — M25562 Pain in left knee: Secondary | ICD-10-CM

## 2019-02-21 DIAGNOSIS — G8929 Other chronic pain: Secondary | ICD-10-CM

## 2019-02-21 DIAGNOSIS — M545 Low back pain, unspecified: Secondary | ICD-10-CM

## 2019-02-21 DIAGNOSIS — I10 Essential (primary) hypertension: Secondary | ICD-10-CM

## 2019-02-21 DIAGNOSIS — H6122 Impacted cerumen, left ear: Secondary | ICD-10-CM

## 2019-02-21 MED ORDER — METHYLPREDNISOLONE ACETATE 80 MG/ML IJ SUSP
80.0000 mg | Freq: Once | INTRAMUSCULAR | Status: AC
  Administered 2019-02-21: 80 mg via INTRAMUSCULAR

## 2019-02-21 MED ORDER — KETOROLAC TROMETHAMINE 60 MG/2ML IM SOLN
30.0000 mg | Freq: Once | INTRAMUSCULAR | Status: AC
  Administered 2019-02-21: 30 mg via INTRAMUSCULAR

## 2019-02-21 NOTE — Patient Instructions (Addendum)
    Thank you for coming into the office today. I appreciate the opportunity to provide you with the care for your health and wellness. Today we discussed:  Left knee pain, left ear pain, and back pain  Follow Up: as previously scheduled.  No labs today.  Today you received an injection of Toradol pain medication and depo medrol a steroid. These are for your back and knee.   If these work but it does not last, we can look to refer you for a joint injection with the orthopedic doctor.  We have flushed your ears today. If you continue to have the ringing, let us know.  Your blood pressure is elevated. I will mention this to Dr Moshe Cipro and see if we can start you on anything that would be safe for you.  Please continue to practice social distancing to keep you, your family, and our community safe.  If you must go out, please wear a Mask and practice good handwashing.  [place] YOUR HANDS WELL AND FREQUENTLY. AVOID TOUCHING YOUR FACE, UNLESS YOUR HANDS ARE FRESHLY WASHED.  GET FRESH AIR DAILY. STAY HYDRATED WITH WATER.   It was a pleasure to see you and I look forward to continuing to work together on your health and well-being. Please do not hesitate to call the office if you need care or have questions about your care.  Have a wonderful day and week. With Gratitude, Cherly Beach, DNP, AGNP-BC

## 2019-02-21 NOTE — Progress Notes (Signed)
Subjective:     Patient ID: Wesley Brennan, male   DOB: 1960-09-19, 58 y.o.   MRN: [id number]  Wesley Brennan presents for Ear Pain, Leg Pain, and Back Pain  1 week of hearing loss, and ringing in Left ear. Denies trauma, injury. Denies recent infection or cold symptoms in office. Denies headaches, sore throat, or drainage from ear.  1 week of left knee pain. Described as achy. Thinks it is cause of the weather being rainy. Tried tylenol and ice. It helps some, but not fully. Worse with bending, bike riding. Denies trauma, injury. ROM intact  1 week of back pain. Thinks it too is weather related. Achy. Laying down is okay, does wake up at times with pain. No changes to bowel or bladder habits. No leakage or incontinence. No sensation or strength changes. Again made worse with bending and bike riding. Denies trauma and injury.  Today patient denies signs and symptoms of COVID 19 infection including fever, chills, cough, shortness of breath, and headache.  Past Medical, Surgical, Social History, Allergies, and Medications have been Reviewed.   Past Medical History:  Diagnosis Date  . Gout   . Hypercholesteremia   . Hypertension    related to drug use which he has quit  . Substance abuse ([place])    alcohol, nicotine, h/o coacaine and marijuana   Past Surgical History:  Procedure Laterality Date  . COLONOSCOPY N/A 07/02/2015   Procedure: COLONOSCOPY;  Surgeon: Danie Binder, MD;  Location: [facility];  Service: Endoscopy;  Laterality: N/A;  1:00 PM  . ELBOW FUSION  1993   left elbow dislocation  . ELBOW FUSION Left 1979  . HERNIA REPAIR  8206 approx   umbilical hernia   Social History   Socioeconomic History  . Marital status: Single    Spouse name: Not on file  . Number of children: 0  . Years of education: 39  . Highest education level: 12th grade  Occupational History  . Occupation: disabled   Social Needs  . Financial resource strain: Not hard at all  . Food  insecurity    Worry: Never true    Inability: Never true  . Transportation needs    Medical: No    Non-medical: No  Tobacco Use  . Smoking status: Current Every Day Smoker    Packs/day: 0.10    Years: 40.00    Pack years: 4.00    Types: Cigarettes  . Smokeless tobacco: Never Used  . Tobacco comment: cut back from 0.5 ppd.  Substance and Sexual Activity  . Alcohol use: Yes    Comment: 40oz daily  . Drug use: No  . Sexual activity: Yes    Birth control/protection: Condom  Lifestyle  . Physical activity    Days per week: 7 days    Minutes per session: 60 min  . Stress: Not at all  Relationships  . Social connections    Talks on phone: More than three times a week    Gets together: Never    Attends religious service: 1 to 4 times per year    Active member of club or organization: No    Attends meetings of clubs or organizations: Never    Relationship status: Never married  . Intimate partner violence    Fear of current or ex partner: No    Emotionally abused: No    Physically abused: No    Forced sexual activity: No  Other Topics Concern  .  Not on file  Social History Narrative   Lives alone     Outpatient Encounter Medications as of 02/21/2019  Medication Sig  . acetaminophen (TYLENOL) 500 MG tablet Take 1 tablet (500 mg total) by mouth every 6 (six) hours as needed for moderate pain.  Marland Kitchen allopurinol (ZYLOPRIM) 300 MG tablet Take 1 tablet (300 mg total) by mouth daily.  Marland Kitchen aspirin EC 81 MG tablet Take 81 mg by mouth daily.  Marland Kitchen ezetimibe (ZETIA) 10 MG tablet Take 1 tablet (10 mg total) by mouth daily.  . polyethylene glycol-electrolytes (TRILYTE) 420 g solution Take 4,000 mLs by mouth as directed.   No facility-administered encounter medications on file as of 02/21/2019.    No Known Allergies  Review of Systems  Constitutional: Negative for chills and fever.  HENT: Positive for tinnitus.        Hearing loss  Eyes: Negative.   Respiratory: Negative for cough and  shortness of breath.   Cardiovascular: Negative.   Gastrointestinal: Negative.   Endocrine: Negative.   Genitourinary: Negative.   Musculoskeletal: Positive for arthralgias and back pain.  Skin: Negative.   Allergic/Immunologic: Negative.   Neurological: Negative.   Hematological: Negative.   Psychiatric/Behavioral: Negative.   All other systems reviewed and are negative.      Objective:     BP (!) 150/94   Pulse 83   Temp 98.9 F (37.2 C) (Temporal)   Resp 12   Ht 5\' 11"  (1.803 m)   Wt 150 lb (68 kg)   SpO2 98%   BMI 20.92 kg/m   Physical Exam Vitals signs and nursing note reviewed.  Constitutional:      Appearance: Normal appearance. He is obese.  HENT:     Head: Normocephalic and atraumatic.     Right Ear: Ear canal and external ear normal.     Left Ear: External ear normal. There is impacted cerumen.     Ears:     Comments: Partial impacted cerumen on Right side, full impaction on Left side.     Nose: Nose normal.     Mouth/Throat:     Pharynx: Oropharynx is clear.  Eyes:     General:        Right eye: No discharge.        Left eye: No discharge.     Conjunctiva/sclera: Conjunctivae normal.  Neck:     Musculoskeletal: Normal range of motion and neck supple.  Cardiovascular:     Rate and Rhythm: Normal rate and regular rhythm.     Pulses: Normal pulses.     Heart sounds: Normal heart sounds.  Pulmonary:     Effort: Pulmonary effort is normal.     Breath sounds: Normal breath sounds.  Musculoskeletal:     Left knee: He exhibits normal range of motion, no swelling, no effusion, no ecchymosis, no deformity, no laceration, no erythema, normal alignment, no LCL laxity and no bony tenderness. Tenderness found.     Lumbar back: He exhibits pain. He exhibits normal range of motion, no tenderness, no bony tenderness, no swelling, no edema, no deformity and no laceration.  Skin:    General: Skin is warm.  Neurological:     General: No focal deficit present.      Mental Status: He is alert and oriented to person, place, and time.  Psychiatric:        Mood and Affect: Mood normal.        Behavior: Behavior normal.  Thought Content: Thought content normal.        Judgment: Judgment normal.    Procedure:  Flushed ears: Elephant Ear used. Pt tolerated well. Nothing from Right, Some cerumen from Left. TM partial visualized after flush.     Assessment and Plan         1. Chronic pain of left knee Depo and Toradol given for chronic knee pain today. Encourage him to keep active and use tylenol and ice as needed. Might need referral to ortho for joint injection if this continues.  Reviewed side effects, risks and benefits of medication.   Patient acknowledged agreement and understanding of the plan.   - methylPREDNISolone acetate (DEPO-MEDROL) injection 80 mg - ketorolac (TORADOL) injection 30 mg  2. Bilateral low back pain without sciatica, unspecified chronicity Depo and Toradol given for back pain today. Encourage him to keep active and use tylenol and ice as needed.  Reviewed side effects, risks and benefits of medication.   Patient acknowledged agreement and understanding of the plan.   - methylPREDNISolone acetate (DEPO-MEDROL) injection 80 mg - ketorolac (TORADOL) injection 30 mg  3. Hearing loss due to cerumen impaction, left See above. He reported feeling better and hearing better post lavage.  - Ear Lavage  4. Essential hypertension Not controlled. Will discuss with Dr Moshe Cipro.  Follow Up: 04/08/2019  Perlie Mayo, DNP, AGNP-BC [place], [place],  [address] Office Hours: Mon-Thurs 8 am-5 pm; Fri 8 am-12 pm Office Phone:  [phone]  Office Fax: [phone]

## 2019-02-25 ENCOUNTER — Telehealth: Payer: Self-pay

## 2019-02-25 ENCOUNTER — Other Ambulatory Visit: Payer: Self-pay | Admitting: Family Medicine

## 2019-02-25 DIAGNOSIS — I10 Essential (primary) hypertension: Secondary | ICD-10-CM

## 2019-02-25 MED ORDER — AMLODIPINE BESYLATE 2.5 MG PO TABS: 2.5000 mg | ORAL_TABLET | Freq: Every day | ORAL | 3 refills | Status: DC

## 2019-02-25 NOTE — Telephone Encounter (Signed)
Spoke with patient and advised him that Norvasc 2.5mg  had been called into [facility] for him. To be taken QD with verbal understanding.

## 2019-02-25 NOTE — Telephone Encounter (Signed)
-----   Message from Perlie Mayo, NP sent at 02/25/2019  8:20 AM EDT ----- Regarding: BP med Please call Mr Balestrieri and let him know Dr Moshe Cipro and I both think low dose Norvasc would be good for his BP.  I have sent in 2.5 mg of Norvasc to [facility]. If he has questions let me know.  Thank you so much

## 2019-03-06 ENCOUNTER — Ambulatory Visit: Payer: Medicare HMO | Admitting: Gastroenterology

## 2019-03-19 DIAGNOSIS — R7301 Impaired fasting glucose: Secondary | ICD-10-CM | POA: Diagnosis not present

## 2019-03-19 DIAGNOSIS — E785 Hyperlipidemia, unspecified: Secondary | ICD-10-CM | POA: Diagnosis not present

## 2019-03-19 DIAGNOSIS — E790 Hyperuricemia without signs of inflammatory arthritis and tophaceous disease: Secondary | ICD-10-CM | POA: Diagnosis not present

## 2019-03-20 LAB — HEPATIC FUNCTION PANEL
AG Ratio: 1.6 (calc) (ref 1.0–2.5)
ALT: 169 U/L — ABNORMAL HIGH (ref 9–46)
AST: 289 U/L — ABNORMAL HIGH (ref 10–35)
Albumin: 3.9 g/dL (ref 3.6–5.1)
Alkaline phosphatase (APISO): 87 U/L (ref 35–144)
Bilirubin, Direct: 0.2 mg/dL (ref 0.0–0.2)
Globulin: 2.5 g/dL (calc) (ref 1.9–3.7)
Indirect Bilirubin: 0.6 mg/dL (calc) (ref 0.2–1.2)
Total Bilirubin: 0.8 mg/dL (ref 0.2–1.2)
Total Protein: 6.4 g/dL (ref 6.1–8.1)

## 2019-03-20 LAB — URIC ACID: Uric Acid, Serum: 7.1 mg/dL (ref 4.0–8.0)

## 2019-03-20 LAB — HEMOGLOBIN A1C
Hgb A1c MFr Bld: 5 % of total Hgb (ref <5.7)
Mean Plasma Glucose: 97 (calc)
eAG (mmol/L): 5.4 (calc)

## 2019-03-20 LAB — LIPID PANEL
Cholesterol: 233 mg/dL — ABNORMAL HIGH (ref <200)
HDL: 45 mg/dL (ref >=40)
Non-HDL Cholesterol (Calc): 188 mg/dL (calc) — ABNORMAL HIGH (ref <130)
Total CHOL/HDL Ratio: 5.2 (calc) — ABNORMAL HIGH (ref <5.0)
Triglycerides: 1526 mg/dL — ABNORMAL HIGH (ref <150)

## 2019-03-25 ENCOUNTER — Other Ambulatory Visit: Payer: Self-pay

## 2019-03-25 ENCOUNTER — Other Ambulatory Visit (HOSPITAL_COMMUNITY)
Admission: RE | Admit: 2019-03-25 | Discharge: 2019-03-25 | Disposition: A | Discharge status: Home / Self Care | Payer: Medicare HMO | Source: Ambulatory Visit | Attending: Gastroenterology | Admitting: Gastroenterology

## 2019-03-25 ENCOUNTER — Encounter (HOSPITAL_COMMUNITY)
Admission: RE | Admit: 2019-03-25 | Discharge: 2019-03-25 | Disposition: A | Discharge status: Home / Self Care | Payer: Medicare HMO | Source: Ambulatory Visit | Attending: Gastroenterology | Admitting: Gastroenterology

## 2019-03-25 DIAGNOSIS — Z01812 Encounter for preprocedural laboratory examination: Secondary | ICD-10-CM | POA: Insufficient documentation

## 2019-03-25 DIAGNOSIS — Z20828 Contact with and (suspected) exposure to other viral communicable diseases: Secondary | ICD-10-CM | POA: Diagnosis not present

## 2019-03-26 ENCOUNTER — Telehealth: Payer: Self-pay | Admitting: General Practice

## 2019-03-26 LAB — SARS CORONAVIRUS 2 (TAT 6-24 HRS): SARS Coronavirus 2: NEGATIVE

## 2019-03-26 NOTE — Telephone Encounter (Signed)
Negative COVID results given. Patient results "NOT Detected." Caller expressed understanding. ° °

## 2019-03-27 ENCOUNTER — Encounter (HOSPITAL_COMMUNITY)
Admission: RE | Disposition: A | Discharge status: Home / Self Care | Payer: Self-pay | Source: Home / Self Care | Attending: Gastroenterology

## 2019-03-27 ENCOUNTER — Encounter (HOSPITAL_COMMUNITY): Payer: Self-pay | Admitting: *Deleted

## 2019-03-27 ENCOUNTER — Ambulatory Visit (HOSPITAL_COMMUNITY): Payer: Medicare HMO | Admitting: Anesthesiology

## 2019-03-27 ENCOUNTER — Ambulatory Visit (HOSPITAL_COMMUNITY)
Admission: RE | Admit: 2019-03-27 | Discharge: 2019-03-27 | Disposition: A | Discharge status: Home / Self Care | Payer: Medicare HMO | Attending: Gastroenterology | Admitting: Gastroenterology

## 2019-03-27 DIAGNOSIS — I10 Essential (primary) hypertension: Secondary | ICD-10-CM | POA: Insufficient documentation

## 2019-03-27 DIAGNOSIS — Z7982 Long term (current) use of aspirin: Secondary | ICD-10-CM | POA: Insufficient documentation

## 2019-03-27 DIAGNOSIS — Q438 Other specified congenital malformations of intestine: Secondary | ICD-10-CM | POA: Diagnosis not present

## 2019-03-27 DIAGNOSIS — Z86010 Personal history of colonic polyps: Secondary | ICD-10-CM

## 2019-03-27 DIAGNOSIS — K635 Polyp of colon: Secondary | ICD-10-CM | POA: Diagnosis not present

## 2019-03-27 DIAGNOSIS — F17210 Nicotine dependence, cigarettes, uncomplicated: Secondary | ICD-10-CM | POA: Insufficient documentation

## 2019-03-27 DIAGNOSIS — Z1211 Encounter for screening for malignant neoplasm of colon: Secondary | ICD-10-CM | POA: Diagnosis not present

## 2019-03-27 DIAGNOSIS — M109 Gout, unspecified: Secondary | ICD-10-CM | POA: Diagnosis not present

## 2019-03-27 DIAGNOSIS — D123 Benign neoplasm of transverse colon: Secondary | ICD-10-CM | POA: Insufficient documentation

## 2019-03-27 DIAGNOSIS — Z79899 Other long term (current) drug therapy: Secondary | ICD-10-CM | POA: Diagnosis not present

## 2019-03-27 DIAGNOSIS — E7800 Pure hypercholesterolemia, unspecified: Secondary | ICD-10-CM | POA: Insufficient documentation

## 2019-03-27 HISTORY — PX: COLONOSCOPY WITH PROPOFOL: SHX5780

## 2019-03-27 HISTORY — PX: POLYPECTOMY: SHX5525

## 2019-03-27 SURGERY — COLONOSCOPY WITH PROPOFOL
Anesthesia: General

## 2019-03-27 MED ORDER — HYDROCODONE-ACETAMINOPHEN 7.5-325 MG PO TABS: 1.0000 | ORAL_TABLET | Freq: Once | ORAL | Status: DC | PRN

## 2019-03-27 MED ORDER — PROPOFOL 500 MG/50ML IV EMUL
INTRAVENOUS | Status: DC | PRN
  Administered 2019-03-27: 150 ug/kg/min via INTRAVENOUS

## 2019-03-27 MED ORDER — MIDAZOLAM HCL 2 MG/2ML IJ SOLN: 0.5000 mg | Freq: Once | INTRAMUSCULAR | Status: DC | PRN

## 2019-03-27 MED ORDER — PROPOFOL 10 MG/ML IV BOLUS
INTRAVENOUS | Status: DC | PRN
  Administered 2019-03-27: 10 mg via INTRAVENOUS
  Administered 2019-03-27 (×4): 20 mg via INTRAVENOUS
  Administered 2019-03-27: 10 mg via INTRAVENOUS
  Administered 2019-03-27 (×2): 20 mg via INTRAVENOUS

## 2019-03-27 MED ORDER — CHLORHEXIDINE GLUCONATE CLOTH 2 % EX PADS: 6.0000 | MEDICATED_PAD | Freq: Once | CUTANEOUS | Status: DC

## 2019-03-27 MED ORDER — LACTATED RINGERS IV SOLN
INTRAVENOUS | Status: DC
  Administered 2019-03-27: 08:00:00 1000 mL via INTRAVENOUS

## 2019-03-27 MED ORDER — HYDROMORPHONE HCL 1 MG/ML IJ SOLN: 0.2500 mg | INTRAMUSCULAR | Status: DC | PRN

## 2019-03-27 MED ORDER — PROMETHAZINE HCL 25 MG/ML IJ SOLN: 6.2500 mg | INTRAMUSCULAR | Status: DC | PRN

## 2019-03-27 NOTE — Anesthesia Postprocedure Evaluation (Signed)
Anesthesia Post Note  Patient: Wesley Brennan  Procedure(s) Performed: COLONOSCOPY WITH PROPOFOL (N/A ) POLYPECTOMY  Patient location during evaluation: PACU Anesthesia Type: General Level of consciousness: awake and alert Pain management: pain level controlled Vital Signs Assessment: post-procedure vital signs reviewed and stable Respiratory status: spontaneous breathing Cardiovascular status: stable and blood pressure returned to baseline Anesthetic complications: no     Last Vitals:  Vitals:   03/27/19 0814 03/27/19 1007  BP: (!) 143/78 123/79  Pulse: 74 73  Resp: 16 16  Temp: 37 C 36.5 C  SpO2: 100% 99%    Last Pain:  Vitals:   03/27/19 1007  TempSrc:   PainSc: (P) Asleep                 Trivia Heffelfinger

## 2019-03-27 NOTE — Discharge Instructions (Signed)
You had 3 polyps removed. ONE WAS LARGER AND I PLACED TWO CLIPS TO PREVENT BLEEDING IN 7-10 DAYS.    IF YOU NEED AN MRI, LET THE RADIOLOGY TECH KNOW THAT YOU HAD TWO CLIPS PLACED IN YOUR COLON. THEY SHOULD FALL OFF IN 30 DAYS.  DRINK WATER TO KEEP YOUR URINE LIGHT YELLOW.  FOLLOW A HIGH FIBER DIET. AVOID ITEMS THAT CAUSE BLOATING & GAS. SEE INFO BELOW.   USE PREPARATION H FOUR TIMES  A DAY IF NEEDED TO RELIEVE RECTAL PAIN/PRESSURE/BLEEDING.   YOUR BIOPSY RESULTS WILL BE BACK IN 5 BUSINESS DAYS.  Next colonoscopy in 3 years.    Colonoscopy Care After Read the instructions outlined below and refer to this sheet in the next week. These discharge instructions provide you with general information on caring for yourself after you leave the hospital. While your treatment has been planned according to the most current medical practices available, unavoidable complications occasionally occur. If you have any problems or questions after discharge, call DR. Trevone Prestwood, [phone].  ACTIVITY  You may resume your regular activity, but move at a slower pace for the next 24 hours.   Take frequent rest periods for the next 24 hours.   Walking will help get rid of the air and reduce the bloated feeling in your belly (abdomen).   No driving for 24 hours (because of the medicine (anesthesia) used during the test).   You may shower.   Do not sign any important legal documents or operate any machinery for 24 hours (because of the anesthesia used during the test).    NUTRITION  Drink plenty of fluids.   You may resume your normal diet as instructed by your doctor.   Begin with a light meal and progress to your normal diet. Heavy or fried foods are harder to digest and may make you feel sick to your stomach (nauseated).   Avoid alcoholic beverages for 24 hours or as instructed.    MEDICATIONS  You may resume your normal medications.   WHAT YOU CAN EXPECT TODAY  Some feelings of  bloating in the abdomen.   Passage of more gas than usual.   Spotting of blood in your stool or on the toilet paper  .  IF YOU HAD POLYPS REMOVED DURING THE COLONOSCOPY:  Eat a soft diet IF YOU HAVE NAUSEA, BLOATING, ABDOMINAL PAIN, OR VOMITING.    FINDING OUT THE RESULTS OF YOUR TEST Not all test results are available during your visit. DR. Oneida Alar WILL CALL YOU WITHIN 14 DAYS OF YOUR PROCEDUE WITH YOUR RESULTS. Do not assume everything is normal if you have not heard from DR. Daveda Larock, CALL HER OFFICE AT [phone].  SEEK IMMEDIATE MEDICAL ATTENTION AND CALL THE OFFICE: [phone] IF:  You have more than a spotting of blood in your stool.   Your belly is swollen (abdominal distention).   You are nauseated or vomiting.   You have a temperature over 101F.   You have abdominal pain or discomfort that is severe or gets worse throughout the day.   High-Fiber Diet A high-fiber diet changes your normal diet to include more whole grains, legumes, fruits, and vegetables. Changes in the diet involve replacing refined carbohydrates with unrefined foods. The calorie level of the diet is essentially unchanged. The Dietary Reference Intake (recommended amount) for adult males is 38 grams per day. For adult females, it is 25 grams per day. Pregnant and lactating women should consume 28 grams of fiber per day. Fiber is the  intact part of a plant that is not broken down during digestion. Functional fiber is fiber that has been isolated from the plant to provide a beneficial effect in the body.  PURPOSE  Increase stool bulk.   Ease and regulate bowel movements.   Lower cholesterol.   REDUCE RISK OF COLON CANCER  INDICATIONS THAT YOU NEED MORE FIBER  Constipation and hemorrhoids.   Uncomplicated diverticulosis (intestine condition) and irritable bowel syndrome.   Weight management.   As a protective measure against hardening of the arteries (atherosclerosis), diabetes, and cancer.    GUIDELINES FOR INCREASING FIBER IN THE DIET  Start adding fiber to the diet slowly. A gradual increase of about 5 more grams (2 servings of most fruits or vegetables) per day is best. Too rapid an increase in fiber may result in constipation, flatulence, and bloating.   Drink enough water and fluids to keep your urine clear or pale yellow. Water, juice, or caffeine-free drinks are recommended. Not drinking enough fluid may cause constipation.   Eat a variety of high-fiber foods rather than one type of fiber.   Try to increase your intake of fiber through using high-fiber foods rather than fiber pills or supplements that contain small amounts of fiber.   The goal is to change the types of food eaten. Do not supplement your present diet with high-fiber foods, but replace foods in your present diet.    Polyps, Colon  A polyp is extra tissue that grows inside your body. Colon polyps grow in the large intestine. The large intestine, also called the colon, is part of your digestive system. It is a long, hollow tube at the end of your digestive tract where your body makes and stores stool. Most polyps are not dangerous. They are benign. This means they are not cancerous. But over time, some types of polyps can turn into cancer. Polyps that are smaller than a pea are usually not harmful. But larger polyps could someday become or may already be cancerous. To be safe, doctors remove all polyps and test them.   WHO GETS POLYPS? Anyone can get polyps, but certain people are more likely than others. You may have a greater chance of getting polyps if:  You are over 50.   You have had polyps before.   Someone in your family has had polyps.   Someone in your family has had cancer of the large intestine.   Find out if someone in your family has had polyps. You may also be more likely to get polyps if you:   Eat a lot of fatty foods   Smoke   Drink alcohol   Do not exercise  Eat too much    PREVENTION There is not one sure way to prevent polyps. You might be able to lower your risk of getting them if you:  Eat more fruits and vegetables and less fatty food.   Do not smoke.   Avoid alcohol.   Exercise every day.   Lose weight if you are overweight.   Eating more calcium and folate can also lower your risk of getting polyps. Some foods that are rich in calcium are milk, cheese, and broccoli. Some foods that are rich in folate are chickpeas, kidney beans, and spinach.

## 2019-03-27 NOTE — Anesthesia Preprocedure Evaluation (Signed)
Anesthesia Evaluation  Patient identified by MRN, date of birth, ID band Patient awake    Reviewed: Allergy & Precautions, NPO status , Patient's Chart, lab work & pertinent test results  Airway Mallampati: I  TM Distance: >3 FB Neck ROM: Full    Dental no notable dental hx. (+) Edentulous Upper, Edentulous Lower   Pulmonary neg pulmonary ROS, Current Smoker and Patient abstained from smoking.,    Pulmonary exam normal breath sounds clear to auscultation       Cardiovascular Exercise Tolerance: Good hypertension, Pt. on medications negative cardio ROS Normal cardiovascular examI Rhythm:Regular Rate:Normal  Rides bike-does yard work -denies CP or DOE   Neuro/Psych negative neurological ROS  negative psych ROS   GI/Hepatic negative GI ROS, Neg liver ROS,   Endo/Other  negative endocrine ROS  Renal/GU negative Renal ROS  negative genitourinary   Musculoskeletal negative musculoskeletal ROS (+)   Abdominal   Peds negative pediatric ROS (+)  Hematology negative hematology ROS (+)   Anesthesia Other Findings   Reproductive/Obstetrics negative OB ROS                             Anesthesia Physical Anesthesia Plan  ASA: II  Anesthesia Plan: General   Post-op Pain Management:    Induction: Intravenous  PONV Risk Score and Plan: Propofol infusion, TIVA and Treatment may vary due to age or medical condition  Airway Management Planned: Nasal Cannula and Simple Face Mask  Additional Equipment:   Intra-op Plan:   Post-operative Plan:   Informed Consent: I have reviewed the patients History and Physical, chart, labs and discussed the procedure including the risks, benefits and alternatives for the proposed anesthesia with the patient or authorized representative who has indicated his/her understanding and acceptance.     Dental advisory given  Plan Discussed with: CRNA  Anesthesia  Plan Comments: (Plan Full PPE use  Plan GA with GETA as needed d/w pt -WTP with same after Q&A)        Anesthesia Quick Evaluation

## 2019-03-27 NOTE — Op Note (Signed)
[facility] Patient Name: Wesley Brennan Procedure Date: 03/27/2019 9:28 AM MRN: [id number] Date of Birth: 11/18/1960 Attending MD: Barney Drain MD, MD CSN: [id number] Age: 58 Admit Type: Outpatient Procedure:                Colonoscopy WITH COLD SNARE/SNARE CAUTERY                            POLYPECTOMY Indications:              Personal history of colonic polyps Providers:                Barney Drain MD, MD, Janeece Riggers, RN, Nelma Rothman,                            Technician Referring MD:             Norwood Levo. Simpson MD, MD Medicines:                Propofol per Anesthesia Complications:            No immediate complications. Estimated Blood Loss:     Estimated blood loss was minimal. Procedure:                Pre-Anesthesia Assessment:                           - Prior to the procedure, a History and Physical                            was performed, and patient medications and                            allergies were reviewed. The patient's tolerance of                            previous anesthesia was also reviewed. The risks                            and benefits of the procedure and the sedation                            options and risks were discussed with the patient.                            All questions were answered, and informed consent                            was obtained. Prior Anticoagulants: The patient has                            taken no previous anticoagulant or antiplatelet                            agents except for aspirin. ASA Grade Assessment: II                            -  A patient with mild systemic disease. After                            reviewing the risks and benefits, the patient was                            deemed in satisfactory condition to undergo the                            procedure. After obtaining informed consent, the                            colonoscope was passed under direct vision.                             Throughout the procedure, the patient's blood                            pressure, pulse, and oxygen saturations were                            monitored continuously. The [id number]                            scope was introduced through the anus and advanced                            to the the cecum, identified by appendiceal orifice                            and ileocecal valve. The colonoscopy was somewhat                            difficult due to a tortuous colon. Successful                            completion of the procedure was aided by                            straightening and shortening the scope to obtain                            bowel loop reduction and COLOWRAP. The patient                            tolerated the procedure well. The quality of the                            bowel preparation was good. The ileocecal valve,                            appendiceal orifice, and rectum were photographed. Scope In: 9:35:55 AM Scope Out: 9:58:35 AM Scope Withdrawal Time: 0 hours 20 minutes 5 seconds  Total Procedure Duration: 0 hours 22 minutes 40 seconds  Findings:      Two sessile polyps were found in the splenic flexure. The polyps were 2       to 4 mm in size. These polyps were removed with a cold snare. Resection       and retrieval were complete.      A 12 mm polyp was found in the mid transverse colon. The polyp was       sessile. The polyp was removed with a hot snare. Resection and retrieval       were complete. To prevent bleeding after the polypectomy, two hemostatic       clips were successfully placed (MR conditional). There was no bleeding       at the end of the procedure.      The recto-sigmoid colon and sigmoid colon were mildly tortuous.      The exam was otherwise without abnormality. Impression:               - Two 2 to 4 mm polyps at the splenic flexure,                            removed with a cold snare. Resected and retrieved.                            - One 12 mm polyp in the mid transverse colon,                            removed with a hot snare. Resected and retrieved.                            Clips (MR conditional) were placed.                           - Tortuous LEFT colon. Moderate Sedation:      Per Anesthesia Care Recommendation:           - Patient has a contact number available for                            emergencies. The signs and symptoms of potential                            delayed complications were discussed with the                            patient. Return to normal activities tomorrow.                            Written discharge instructions were provided to the                            patient.                           - NEEDS KUB PRIOR TO MRI DUE TO CLIP PLACEENT IN  TRANSVERSE COLON.                           - High fiber diet.                           - Continue present medications.                           - Await pathology results.                           - Repeat colonoscopy in 3 years for surveillance. Procedure Code(s):        --- Professional ---                           [phone], Colonoscopy, flexible; with removal of                            tumor(s), polyp(s), or other lesion(s) by snare                            technique Diagnosis Code(s):        --- Professional ---                           K63.5, Polyp of colon                           Z86.010, Personal history of colonic polyps                           Q43.8, Other specified congenital malformations of                            intestine CPT copyright 2019 American Medical Association. All rights reserved. The codes documented in this report are preliminary and upon coder review may  be revised to meet current compliance requirements. Barney Drain, MD Barney Drain MD, MD 03/27/2019 10:13:33 AM This report has been signed electronically. Number of Addenda: 0

## 2019-03-27 NOTE — Transfer of Care (Signed)
Immediate Anesthesia Transfer of Care Note  Patient: Wesley Brennan  Procedure(s) Performed: COLONOSCOPY WITH PROPOFOL (N/A ) POLYPECTOMY  Patient Location: PACU  Anesthesia Type:General  Level of Consciousness: awake and alert   Airway & Oxygen Therapy: Patient Spontanous Breathing  Post-op Assessment: Report given to RN  Post vital signs: Reviewed and stable  Last Vitals:  Vitals Value Taken Time  BP 123/79 03/27/19 1007  Temp 36.5 C 03/27/19 1007  Pulse 72 03/27/19 1009  Resp 15 03/27/19 1009  SpO2 99 % 03/27/19 1009  Vitals shown include unvalidated device data.  Last Pain:  Vitals:   03/27/19 1007  TempSrc:   PainSc: (P) Asleep      Patients Stated Pain Goal: 8 ([id number])  Complications: No apparent anesthesia complications

## 2019-03-27 NOTE — H&P (Signed)
Primary Care Physician:  Fayrene Helper, MD Primary Gastroenterologist:  Dr. Oneida Alar  Pre-Procedure History & Physical: HPI:  Wesley Brennan is a 58 y.o. male here for  PERSONAL HISTORY OF POLYPS.  Past Medical History:  Diagnosis Date  . Gout   . Hypercholesteremia   . Hypertension    related to drug use which he has quit  . Substance abuse ([place])    alcohol, nicotine, h/o coacaine and marijuana    Past Surgical History:  Procedure Laterality Date  . COLONOSCOPY N/A 07/02/2015   Procedure: COLONOSCOPY;  Surgeon: Danie Binder, MD;  [facility];  Service: Endoscopy;  Laterality: N/A;  1:00 PM  . ELBOW FUSION  1993   left elbow dislocation  . ELBOW FUSION Left 1979  . HERNIA REPAIR  [id number] approx   umbilical hernia    Prior to Admission medications   Medication Sig Start Date End Date Taking? Authorizing Provider  allopurinol (ZYLOPRIM) 300 MG tablet Take 1 tablet (300 mg total) by mouth daily. 01/10/19  Yes Fayrene Helper, MD  amLODipine (NORVASC) 2.5 MG tablet Take 1 tablet (2.5 mg total) by mouth daily. 02/25/19  Yes Perlie Mayo, NP  aspirin EC 81 MG tablet Take 81 mg by mouth daily.   Yes [provider]  polyethylene glycol-electrolytes (TRILYTE) 420 g solution Take 4,000 mLs by mouth as directed. 12/20/18  Yes Londell Noll, Marga Melnick, MD  acetaminophen (TYLENOL) 500 MG tablet Take 1 tablet (500 mg total) by mouth every 6 (six) hours as needed for moderate pain. Patient not taking: Reported on 03/18/2019 10/28/18   Perlie Mayo, NP  ezetimibe (ZETIA) 10 MG tablet Take 1 tablet (10 mg total) by mouth daily. Patient not taking: Reported on 03/18/2019 01/10/19   Fayrene Helper, MD    Allergies as of 09/18/2018  . (No Known Allergies)    Family History  Adopted: Yes  Problem Relation Age of Onset  . Cancer Mother 50       Bone   . Diabetes Mother   . Hypertension Sister   . Stroke Father 87  . Heart disease Father 35  . Diabetes Father   .  Colon cancer Neg Hx     Social History   Socioeconomic History  . Marital status: Single    Spouse name: Not on file  . Number of children: 0  . Years of education: 76  . Highest education level: 12th grade  Occupational History  . Occupation: disabled   Social Needs  . Financial resource strain: Not hard at all  . Food insecurity    Worry: Never true    Inability: Never true  . Transportation needs    Medical: No    Non-medical: No  Tobacco Use  . Smoking status: Current Every Day Smoker    Packs/day: 0.10    Years: 40.00    Pack years: 4.00    Types: Cigarettes  . Smokeless tobacco: Never Used  . Tobacco comment: cut back from 0.5 ppd.  Substance and Sexual Activity  . Alcohol use: Yes    Comment: 40oz daily  . Drug use: No  . Sexual activity: Yes    Birth control/protection: Condom  Lifestyle  . Physical activity    Days per week: 7 days    Minutes per session: 60 min  . Stress: Not at all  Relationships  . Social connections    Talks on phone: More than three times a week  Gets together: Never    Attends religious service: 1 to 4 times per year    Active member of club or organization: No    Attends meetings of clubs or organizations: Never    Relationship status: Never married  . Intimate partner violence    Fear of current or ex partner: No    Emotionally abused: No    Physically abused: No    Forced sexual activity: No  Other Topics Concern  . Not on file  Social History Narrative   Lives alone     Review of Systems: See HPI, otherwise negative ROS   Physical Exam: BP (!) 143/78   Pulse 74   Temp 98.6 F (37 C) (Oral)   Resp 16   Ht 5\' 11"  (1.803 m)   Wt 61.2 kg   SpO2 100%   BMI 18.83 kg/m  General:   Alert,  pleasant and cooperative in NAD Head:  Normocephalic and atraumatic. Neck:  Supple; Lungs:  Clear throughout to auscultation.    Heart:  Regular rate and rhythm. Abdomen:  Soft, nontender and nondistended. Normal bowel  sounds, without guarding, and without rebound.   Neurologic:  Alert and  oriented x4;  grossly normal neurologically.  Impression/Plan:     PERSONAL HISTORY OF POLYPS.  PLAN: 1. TCS TODAY. DISCUSSED PROCEDURE, BENEFITS, & RISKS: < 1% chance of medication reaction, bleeding, perforation, ASPIRATION, or rupture of spleen/liver requiring surgery to fix it and missed polyps < 1 cm 10-20% of the time.

## 2019-03-27 NOTE — Progress Notes (Signed)
CC'D TO PCP °

## 2019-03-31 ENCOUNTER — Encounter (HOSPITAL_COMMUNITY): Payer: Self-pay | Admitting: Gastroenterology

## 2019-04-03 ENCOUNTER — Telehealth: Payer: Self-pay | Admitting: Gastroenterology

## 2019-04-03 NOTE — Telephone Encounter (Signed)
PLEASE CALL PT. HE HAD THREE SIMPLE ADENOMAS REMOVED.     IF YOU NEED AN MRI, LET THE RADIOLOGY TECH KNOW THAT YOU HAD TWO CLIPS PLACED IN YOUR COLON. THEY SHOULD FALL OFF IN 30 DAYS. DRINK WATER TO KEEP YOUR URINE LIGHT YELLOW. FOLLOW A HIGH FIBER DIET. AVOID ITEMS THAT CAUSE BLOATING & GAS.  USE PREPARATION H FOUR TIMES  A DAY IF NEEDED TO RELIEVE RECTAL PAIN/PRESSURE/BLEEDING.  Next colonoscopy in 3 years.

## 2019-04-06 NOTE — Telephone Encounter (Signed)
Reminder in epic °

## 2019-04-08 ENCOUNTER — Ambulatory Visit (INDEPENDENT_AMBULATORY_CARE_PROVIDER_SITE_OTHER): Payer: Medicare HMO | Admitting: Family Medicine

## 2019-04-08 ENCOUNTER — Encounter: Payer: Self-pay | Admitting: Family Medicine

## 2019-04-08 ENCOUNTER — Other Ambulatory Visit: Payer: Self-pay

## 2019-04-08 VITALS — BP 138/82 | HR 80 | Temp 98.6°F | Resp 15 | Ht 71.0 in | Wt 143.0 lb

## 2019-04-08 DIAGNOSIS — E790 Hyperuricemia without signs of inflammatory arthritis and tophaceous disease: Secondary | ICD-10-CM

## 2019-04-08 DIAGNOSIS — Z23 Encounter for immunization: Secondary | ICD-10-CM

## 2019-04-08 DIAGNOSIS — Z720 Tobacco use: Secondary | ICD-10-CM

## 2019-04-08 DIAGNOSIS — R7401 Elevation of levels of liver transaminase levels: Secondary | ICD-10-CM

## 2019-04-08 DIAGNOSIS — Z125 Encounter for screening for malignant neoplasm of prostate: Secondary | ICD-10-CM

## 2019-04-08 DIAGNOSIS — E785 Hyperlipidemia, unspecified: Secondary | ICD-10-CM

## 2019-04-08 DIAGNOSIS — F17210 Nicotine dependence, cigarettes, uncomplicated: Secondary | ICD-10-CM | POA: Diagnosis not present

## 2019-04-08 DIAGNOSIS — M1A0710 Idiopathic chronic gout, right ankle and foot, without tophus (tophi): Secondary | ICD-10-CM | POA: Diagnosis not present

## 2019-04-08 DIAGNOSIS — F10229 Alcohol dependence with intoxication, unspecified: Secondary | ICD-10-CM

## 2019-04-08 DIAGNOSIS — R740 Nonspecific elevation of levels of transaminase and lactic acid dehydrogenase [LDH]: Secondary | ICD-10-CM

## 2019-04-08 DIAGNOSIS — E559 Vitamin D deficiency, unspecified: Secondary | ICD-10-CM

## 2019-04-08 DIAGNOSIS — I10 Essential (primary) hypertension: Secondary | ICD-10-CM

## 2019-04-08 MED ORDER — AMLODIPINE BESYLATE 2.5 MG PO TABS: 2.5000 mg | ORAL_TABLET | Freq: Every day | ORAL | 3 refills | Status: DC

## 2019-04-08 MED ORDER — ALLOPURINOL 300 MG PO TABS: 300.0000 mg | ORAL_TABLET | Freq: Every day | ORAL | 6 refills | Status: DC

## 2019-04-08 NOTE — Telephone Encounter (Signed)
Pt is aware of his results and his next appointment.

## 2019-04-08 NOTE — Patient Instructions (Addendum)
F/U with MD in early  March cal if you need me before  Flu vaccine today.  PSA, uric acid level, fasting lipid , cmp and EGFR, cBC, pSA and  and vit D last week in Feb  You need to STOP alcohol as your liver is being destroyed  Please cut back on fried and fatty foods to reduce risk of heart disease  Please no more cigarettes, as that increases your risk of cancer, heart disease and stroke  Thanks for choosing [facility], we consider it a privelige to serve you.

## 2019-04-08 NOTE — Telephone Encounter (Signed)
LMOM to call.

## 2019-04-12 ENCOUNTER — Encounter: Payer: Self-pay | Admitting: Family Medicine

## 2019-04-12 NOTE — Assessment & Plan Note (Signed)
Hyperlipidemia:Low fat diet discussed and encouraged.   Lipid Panel  Lab Results  Component Value Date   CHOL 233 (H) 03/19/2019   HDL 45 03/19/2019   [place]  03/19/2019     Comment:     . LDL cholesterol not calculated. Triglyceride levels greater than 400 mg/dL invalidate calculated LDL results. . Reference range: <100 . Desirable range <100 mg/dL for primary prevention;   <70 mg/dL for patients with CHD or diabetic patients  with > or = 2 CHD risk factors. Marland Kitchen LDL-C is now calculated using the Martin-Hopkins  calculation, which is a validated novel method providing  better accuracy than the Friedewald equation in the  estimation of LDL-C.  Cresenciano Genre et al. Annamaria Helling. [id number]): 2061-2068  (http://education.[employer].com/faq/FAQ164)    TRIG 1,526 (H) 03/19/2019   CHOLHDL 5.2 (H) 03/19/2019

## 2019-04-12 NOTE — Progress Notes (Signed)
Wesley Brennan     MRN: [id number]      DOB: 05-14-1961   HPI Wesley Brennan is here for follow up and re-evaluation of chronic medical conditions, medication management and review of any available recent lab and radiology data.  Preventive health is updated, specifically  Cancer screening and Immunization.   Questions or concerns regarding consultations or procedures which the PT has had in the interim are  addressed. The PT denies any adverse reactions to current medications since the last visit.  Discussed the importance of stopping alcohol use as his liver is severeley damaged. ROS Denies recent fever or chills. Denies sinus pressure, nasal congestion, ear pain or sore throat. Denies chest congestion, productive cough or wheezing. Denies chest pains, palpitations and leg swelling Denies abdominal pain, nausea, vomiting,diarrhea or constipation.   Denies dysuria, frequency, hesitancy or incontinence. Denies joint pain, swelling and limitation in mobility. Denies headaches, seizures, numbness, or tingling. Denies depression, anxiety or insomnia. Denies skin break down or rash.   PE  BP 138/82   Pulse 80   Temp 98.6 F (37 C) (Temporal)   Resp 15   Ht 5\' 11"  (1.803 m)   Wt 143 lb (64.9 kg)   SpO2 98%   BMI 19.94 kg/m   Patient alert and oriented and in no cardiopulmonary distress.  HEENT: No facial asymmetry, EOMI,   oropharynx pink and moist.  Neck supple no JVD, no mass.  Chest: Clear to auscultation bilaterally.  CVS: S1, S2 no murmurs, no S3.Regular rate.  ABD: Soft non tender.   Ext: No edema  MS: Adequate ROM spine, shoulders, hips and knees.decreased ROM LUE  Skin: Intact, no ulcerations or rash noted.  Psych: Good eye contact, normal affect. Memory intact not anxious or depressed appearing.  CNS: CN 2-12 intact, power,  normal throughout.no focal deficits noted.   Assessment & Plan  Essential hypertension .Wesley Brennan DASH diet and commitment to daily  physical activity for a minimum of 30 minutes discussed and encouraged, as a part of hypertension management. The importance of attaining a healthy weight is also discussed.  BP/Weight 04/08/2019 03/27/2019 02/21/2019 01/08/2019 09/09/2018 08/18/2018 [id number]  Systolic BP 0000000 [id number] [id number]  Diastolic BP 82 82 90 82 89 80 89  Wt. (Lbs) 143 135 150 144.12 167.4 156 156  BMI 19.94 18.83 20.92 20.1 23.35 21.76 21.76       Tobacco use Asked:confirms currently smokes cigarettes Assess: Unwilling to quit but cutting back Advise: needs to QUIT to reduce risk of cancer, cardio and cerebrovascular disease Assist: counseled for 5 minutes and literature provided Arrange: follow up in 3 months   Transaminitis Markedly deteriorated due tpo alcohol use, has upcoming appt with gI, counselled re need to stop alcohol  Hyperlipidemia LDL goal <100 Hyperlipidemia:Low fat diet discussed and encouraged.   Lipid Panel  Lab Results  Component Value Date   CHOL 233 (H) 03/19/2019   HDL 45 03/19/2019   [place]  03/19/2019     Comment:     . LDL cholesterol not calculated. Triglyceride levels greater than 400 mg/dL invalidate calculated LDL results. . Reference range: <100 . Desirable range <100 mg/dL for primary prevention;   <70 mg/dL for patients with CHD or diabetic patients  with > or = 2 CHD risk factors. Marland Kitchen LDL-C is now calculated using the Martin-Hopkins  calculation, which is a validated novel method providing  better accuracy than the Friedewald equation in the  estimation of LDL-C.  Cresenciano Genre et al. Annamaria Helling. [id number]): 2061-2068  (http://education.[employer].com/faq/FAQ164)    TRIG 1,526 (H) 03/19/2019   CHOLHDL 5.2 (H) 03/19/2019       Alcohol dependence (HCC) Excessive alcohol use resulting in liver damage, urgent need to discontinue same discussed with pt

## 2019-04-12 NOTE — Assessment & Plan Note (Signed)
Excessive alcohol use resulting in liver damage, urgent need to discontinue same discussed with pt

## 2019-04-12 NOTE — Assessment & Plan Note (Signed)
./  con1 DASH diet and commitment to daily physical activity for a minimum of 30 minutes discussed and encouraged, as a part of hypertension management. The importance of attaining a healthy weight is also discussed.  BP/Weight 04/08/2019 03/27/2019 02/21/2019 01/08/2019 09/09/2018 08/18/2018 [id number]  Systolic BP 0000000 A999333 Q000111Q [id number] 123456 123456 A999333  Diastolic BP 82 82 90 82 89 80 89  Wt. (Lbs) 143 135 150 144.12 167.4 156 156  BMI 19.94 18.83 20.92 20.1 23.35 21.76 21.76

## 2019-04-12 NOTE — Assessment & Plan Note (Signed)
Asked:confirms currently smokes cigarettes Assess: Unwilling to quit but cutting back Advise: needs to QUIT to reduce risk of cancer, cardio and cerebrovascular disease Assist: counseled for 5 minutes and literature provided Arrange: follow up in 3 months  

## 2019-04-12 NOTE — Assessment & Plan Note (Signed)
Markedly deteriorated due tpo alcohol use, has upcoming appt with gI, counselled re need to stop alcohol

## 2019-05-06 NOTE — Progress Notes (Signed)
Referring Provider: Fayrene Helper, MD Primary Care Physician:  Fayrene Helper, MD Primary GI Physician: Dr. Oneida Alar  Chief Complaint  Patient presents with  . Follow-up    FU from colonoscopy,elevated lft's    HPI:   Wesley Brennan is a 58 y.o. male past medical history significant for HTN, hypercholesterolemia, colon polyps, alcohol and substance abuse including cocaine and marijuana.  He is presenting today for follow-up of recent colonoscopy and further evaluation of elevated LFTs.  Colonoscopy on 03/27/2019 with two 2-4 mm polyps, one 12 mm polyp with clips placed, and tortuous left colon.  Pathology with tubular adenomas.  Next colonoscopy in 3 years.   Regarding LFT elevation, he had slight elevation of AST first noted in 2014, returned to normal in 2016, then increased again in 2018 and has been steady increasing along with elevation of ALT first noted in 2019. In 2014, Hepatitis C Ab negative, hepatitis B surface antigen and hepatitis B core antibody IgM negative, hepatitis A antibody IgM nonreactive.  Most recent labs on 03/19/2019 with AST 289, ALT 169, alk phos normal, total bilirubin normal.   Right upper quadrant ultrasound on 01/15/2019 with normal gallbladder, liver within normal limits of parenchymal echogenicity, no focal liver lesions.  Today he states he is doing well. No acute complaints. No abdominal pain. No swelling in abdomen or legs. No yellowing of eyes, confusion, easy bruising or bleeding, or dark urine. No GERD or heartburn symptoms. No dysphagia, nausea or vomiting. No unintentional weight loss. BMs daily. No blood in the stool or black stool.   Drinking 1 12 oz beer a day. Started this in September 2020. Was drinking 40 oz beer a day. States it was not hard for him to decrease his alcohol intake. He wants to take care of himself. Would drink 1/5 wine a few times a day when he lived up [place]. Moved here in 11s.   Denies cocaine and marijuana use. Upon  further questioning, he admits to using cocaine in 1980s. Has snorted cocaine in the past. Denies IV drug use. No tattoos. Incarcerated back in 1980's.  No family history of liver disease. No alpha-1 antitrypsin or wilson's disease. No family history of autoimmune conditions. No thyroid disorder or celiac disease.   No smoking in 2 weeks.   Taking tylenol occasionally for a headache. This is less than once a month. No supplements.    Past Medical History:  Diagnosis Date  . Gout   . Hypercholesteremia   . Hypertension    related to drug use which he has quit  . Substance abuse ([place])    alcohol, nicotine, h/o coacaine and marijuana    Past Surgical History:  Procedure Laterality Date  . COLONOSCOPY N/A 07/02/2015   Procedure: COLONOSCOPY;  Surgeon: Danie Binder, MD;  [facility];  Service: Endoscopy;  Laterality: N/A;  1:00 PM  . COLONOSCOPY WITH PROPOFOL N/A 03/27/2019   Procedure: COLONOSCOPY WITH PROPOFOL;  Surgeon: Danie Binder, MD;  [facility];  Service: Endoscopy;  Laterality: N/A;  8:30am  . ELBOW FUSION  1993   left elbow dislocation  . ELBOW FUSION Left 1979  . HERNIA REPAIR  0712 approx   umbilical hernia  . POLYPECTOMY  03/27/2019   Procedure: POLYPECTOMY;  Surgeon: Danie Binder, MD;  [facility];  Service: Endoscopy;;    Current Outpatient Medications  Medication Sig Dispense Refill  . allopurinol (ZYLOPRIM) 300 MG tablet Take 1  tablet (300 mg total) by mouth daily. 30 tablet 6  . amLODipine (NORVASC) 2.5 MG tablet Take 1 tablet (2.5 mg total) by mouth daily. 90 tablet 3  . aspirin EC 81 MG tablet Take 81 mg by mouth daily.     No current facility-administered medications for this visit.     Allergies as of 05/07/2019  . (No Known Allergies)    Family History  Adopted: Yes  Problem Relation Age of Onset  . Cancer Mother 78       Bone   . Diabetes Mother   . Hypertension Sister   . Stroke Father 62  . Heart  disease Father 68  . Diabetes Father   . Colon cancer Neg Hx     Social History   Socioeconomic History  . Marital status: Single    Spouse name: Not on file  . Number of children: 0  . Years of education: 11  . Highest education level: 12th grade  Occupational History  . Occupation: disabled   Social Needs  . Financial resource strain: Not hard at all  . Food insecurity    Worry: Never true    Inability: Never true  . Transportation needs    Medical: No    Non-medical: No  Tobacco Use  . Smoking status: Former Smoker    Packs/day: 0.10    Years: 40.00    Pack years: 4.00    Types: Cigarettes    Quit date: 05/05/2019  . Smokeless tobacco: Never Used  . Tobacco comment: states not smoking often   Substance and Sexual Activity  . Alcohol use: Yes    Comment: 1 beer daily  . Drug use: No  . Sexual activity: Yes    Birth control/protection: Condom  Lifestyle  . Physical activity    Days per week: 7 days    Minutes per session: 60 min  . Stress: Not at all  Relationships  . Social connections    Talks on phone: More than three times a week    Gets together: Never    Attends religious service: 1 to 4 times per year    Active member of club or organization: No    Attends meetings of clubs or organizations: Never    Relationship status: Never married  Other Topics Concern  . Not on file  Social History Narrative   Lives alone     Review of Systems: Gen: Denies fever, chills, lightheadedness, dizziness, syncope, or presyncope HEENT: No cold or flu like symptoms CV: Denies chest pain, palpitations. Resp: Denies dyspnea at rest, cough GI: See HPI Derm: Denies rash Psych: Denies depression, anxiety Heme: See HPI  Physical Exam: BP (!) 155/91   Pulse 74   Temp 98.5 F (36.9 C)   Ht _0  (1.803 m)   Wt 149 lb 12.8 oz (67.9 kg)   BMI 20.89 kg/m  General:   Alert and oriented. No distress noted. Pleasant and cooperative.  Head:  Normocephalic and  atraumatic. Eyes:  Conjuctiva clear without scleral icterus. Heart:  S1, S2 present without murmurs appreciated. Lungs:  Clear to auscultation bilaterally. No wheezes, rales, or rhonchi. No distress.  Abdomen:  +BS, soft, non-tender and non-distended. No rebound or guarding. No HSM or masses noted. Msk:  Symmetrical without gross deformities. Normal posture. Extremities:  Without edema. Neurologic:  Alert and  oriented x4 Psych:  Normal mood and affect.

## 2019-05-07 ENCOUNTER — Other Ambulatory Visit: Payer: Self-pay

## 2019-05-07 ENCOUNTER — Encounter: Payer: Self-pay | Admitting: Gastroenterology

## 2019-05-07 ENCOUNTER — Ambulatory Visit (INDEPENDENT_AMBULATORY_CARE_PROVIDER_SITE_OTHER): Payer: Medicare HMO | Admitting: Gastroenterology

## 2019-05-07 DIAGNOSIS — R7989 Other specified abnormal findings of blood chemistry: Secondary | ICD-10-CM | POA: Diagnosis not present

## 2019-05-07 NOTE — Assessment & Plan Note (Addendum)
58 y.o. male  With past medical history significant for HTN, hypercholesterolemia, colon polyps, and alcohol and substance abuse including cocaine and marijuana who presents today for further evaluation of elevated LFTs. Slight elevation of AST first noted in 2014, returned to normal in 2016, then increased again in 2018 and has been steady increasing along with elevation of ALT first noted in 2019. Most recent labs on 03/19/2019 with AST 289, ALT 169, alk phos normal, total bilirubin normal. No recent CBC. RUQ upper quadrant ultrasound on 01/15/2019 with normal gallbladder, liver within normal limits of parenchymal echogenicity, no focal liver lesions. In 2014, Hepatitis C Ab, hepatitis B surface antigen, hepatitis B core antibody IgM, hepatitis A antibody IgM all negative. HIV non-reactive in 2016. He is without any upper or lower GI symptoms. No signs or symptoms of advanced liver disease. Has cut back from 40 oz beer a day to one 12 oz beer a day. Denies any drug use since the 1980's although urine drug screen positive for cocaine in 2016. No family history of liver disease or other autoimmune conditions.   With essentially 2:1 ratio of AST:ALT, I suspect elevated LFTs are secondary to alcohol abuse. However, will go ahead and update Hep C and Hep B serologies as well as check for any underlying autoimmune condition that could be playing a role.   CBC, HFP, INR, Iron panel with ferritin, ceruloplasmin, alpha-1- antitrypsin phenotype, ANA, SMA, ASMA, immunoglobulins, Hep C Ab, Hep B core Ab, Hep B surface Ab, Hep B surface Ag.  Explained the importance of abstinence and congratulated him on his current progress. Encouraged him to continue working towards abstinence.  Follow-up in 3-4 months.

## 2019-05-07 NOTE — Progress Notes (Signed)
cc'ed to pcp °

## 2019-05-07 NOTE — Patient Instructions (Signed)
1. Please have your labs completed. We will call you with results.   2. Please continue working towards abstinence of alcohol. You have made great progress already! It is important to get to the point of stopping alcohol completely to prevent further damage of your liver.   3. We will plan to see you back in 3-4 months. Call if questions or concerns prior.   Aliene Altes, PA-C [facility]

## 2019-05-08 ENCOUNTER — Telehealth: Payer: Self-pay

## 2019-05-08 DIAGNOSIS — R7989 Other specified abnormal findings of blood chemistry: Secondary | ICD-10-CM | POA: Diagnosis not present

## 2019-05-08 NOTE — Telephone Encounter (Signed)
T/C from [place] at [place] reporting PT/INR not covered by insurance. Pt was at lab at 7:00 am this morning and is coming back around 8:30 am.  Elevated LFT's will not cover. After speaking with Aliene Altes, PA, I informed [place] at [place] they can just take that lab out. They will inform pt when he returns.

## 2019-05-09 NOTE — Progress Notes (Signed)
Hemoglobin slightly low but stable at 12.3.  Platelets normal 222.  His LFTs have significantly improved with ALT now normal and AST elevated at 91 (down from 289).  I suspect this significant decrease is related to his decrease in alcohol consumption.  IgA slightly elevated at 314, IgG normal, IgM slightly low at 48.  This is nonspecific. His ferritin is quite elevated at 1530, iron percent saturation elevated at 73%, iron elevated at 215.  Would like to check hemochromatosis labs, but will wait on the rest of the results before arranging.   Further recommendations to follow.

## 2019-05-14 NOTE — Progress Notes (Signed)
He does not have hepatitis B or C. So far, the rest of the autoimmune labs we checked have been negative. Still waiting on alpha-1-antitrypsin to result.   Doris, can you go ahead and arrange for hemochromatosis labs due to patients elevated ferritin, iron, and iron percent saturations?

## 2019-05-15 ENCOUNTER — Other Ambulatory Visit: Payer: Self-pay

## 2019-05-15 DIAGNOSIS — R7989 Other specified abnormal findings of blood chemistry: Secondary | ICD-10-CM

## 2019-05-15 LAB — IRON,TIBC AND FERRITIN PANEL
%SAT: 73 % (calc) — ABNORMAL HIGH (ref 20–48)
Ferritin: 1530 ng/mL — ABNORMAL HIGH (ref 24–380)
Iron: 215 ug/dL — ABNORMAL HIGH (ref 50–180)
TIBC: 294 mcg/dL (calc) (ref 250–425)

## 2019-05-15 LAB — HEPATITIS C ANTIBODY
Hepatitis C Ab: NONREACTIVE
SIGNAL TO CUT-OFF: 0.05 (ref <1.00)

## 2019-05-15 LAB — CBC WITH DIFFERENTIAL/PLATELET
Absolute Monocytes: 596 cells/uL (ref 200–950)
Basophils Absolute: 40 cells/uL (ref 0–200)
Basophils Relative: 1 %
Eosinophils Absolute: 248 cells/uL (ref 15–500)
Eosinophils Relative: 6.2 %
HCT: 35.4 % — ABNORMAL LOW (ref 38.5–50.0)
Hemoglobin: 12.3 g/dL — ABNORMAL LOW (ref 13.2–17.1)
Lymphs Abs: 1632 cells/uL (ref 850–3900)
MCH: 35.1 pg — ABNORMAL HIGH (ref 27.0–33.0)
MCHC: 34.7 g/dL (ref 32.0–36.0)
MCV: 101.1 fL — ABNORMAL HIGH (ref 80.0–100.0)
MPV: 10.1 fL (ref 7.5–12.5)
Monocytes Relative: 14.9 %
Neutro Abs: 1484 cells/uL — ABNORMAL LOW (ref 1500–7800)
Neutrophils Relative %: 37.1 %
Platelets: 222 10*3/uL (ref 140–400)
RBC: 3.5 10*6/uL — ABNORMAL LOW (ref 4.20–5.80)
RDW: 13.1 % (ref 11.0–15.0)
Total Lymphocyte: 40.8 %
WBC: 4 10*3/uL (ref 3.8–10.8)

## 2019-05-15 LAB — HEPATIC FUNCTION PANEL
AG Ratio: 1.5 (calc) (ref 1.0–2.5)
ALT: 43 U/L (ref 9–46)
AST: 91 U/L — ABNORMAL HIGH (ref 10–35)
Albumin: 4.4 g/dL (ref 3.6–5.1)
Alkaline phosphatase (APISO): 100 U/L (ref 35–144)
Bilirubin, Direct: 0.2 mg/dL (ref 0.0–0.2)
Globulin: 2.9 g/dL (calc) (ref 1.9–3.7)
Indirect Bilirubin: 0.7 mg/dL (calc) (ref 0.2–1.2)
Total Bilirubin: 0.9 mg/dL (ref 0.2–1.2)
Total Protein: 7.3 g/dL (ref 6.1–8.1)

## 2019-05-15 LAB — ALPHA-1 ANTITRYPSIN PHENOTYPE: A-1 Antitrypsin, Ser: 123 mg/dL (ref 83–199)

## 2019-05-15 LAB — HEPATITIS B SURFACE ANTIBODY,QUALITATIVE: Hep B S Ab: NONREACTIVE

## 2019-05-15 LAB — ANA: Anti Nuclear Antibody (ANA): NEGATIVE

## 2019-05-15 LAB — MITOCHONDRIAL ANTIBODIES: Mitochondrial M2 Ab, IgG: <=20 U

## 2019-05-15 LAB — IGG, IGA, IGM
IgG (Immunoglobin G), Serum: 1161 mg/dL (ref 600–1640)
IgM, Serum: 48 mg/dL — ABNORMAL LOW (ref 50–300)
Immunoglobulin A: 314 mg/dL — ABNORMAL HIGH (ref 47–310)

## 2019-05-15 LAB — ANTI-SMOOTH MUSCLE ANTIBODY, IGG: Actin (Smooth Muscle) Antibody (IGG): <20 U (ref <20)

## 2019-05-15 LAB — CERULOPLASMIN: Ceruloplasmin: 24 mg/dL (ref 18–36)

## 2019-05-15 LAB — HEPATITIS B CORE ANTIBODY, TOTAL: Hep B Core Total Ab: NONREACTIVE

## 2019-05-15 LAB — HEPATITIS B SURFACE ANTIGEN: Hepatitis B Surface Ag: NONREACTIVE

## 2019-06-05 ENCOUNTER — Other Ambulatory Visit: Payer: Self-pay

## 2019-06-05 ENCOUNTER — Encounter: Payer: Self-pay | Admitting: Family Medicine

## 2019-06-05 ENCOUNTER — Ambulatory Visit (INDEPENDENT_AMBULATORY_CARE_PROVIDER_SITE_OTHER): Payer: Medicare HMO | Admitting: Family Medicine

## 2019-06-05 ENCOUNTER — Ambulatory Visit: Payer: Medicare Other

## 2019-06-05 VITALS — BP 138/82 | HR 74 | Resp 15 | Ht 71.0 in | Wt 149.0 lb

## 2019-06-05 DIAGNOSIS — Z0000 Encounter for general adult medical examination without abnormal findings: Secondary | ICD-10-CM

## 2019-06-05 NOTE — Patient Instructions (Signed)
Wesley Brennan , Thank you for taking time to come for your Medicare Wellness Visit. I appreciate your ongoing commitment to your health goals. Please review the following plan we discussed and let me know if I can assist you in the future.   Please continue to practice social distancing to keep you, your family, and our community safe.  If you must go out, please wear a Mask and practice good handwashing.  We hope that you have a safe, happy and healthy Holiday season! See you in the New Year!  Screening recommendations/referrals: Colonoscopy: 03/2029 Recommended yearly ophthalmology/optometry visit for glaucoma screening and checkup Recommended yearly dental visit for hygiene and checkup  Vaccinations: Influenza vaccine: Up-to-date Pneumococcal vaccine: Up-to-date  Tdap vaccine: Up-to-date  shingles vaccine: Call and check coverage  Advanced directives: HPOA is sister  Conditions/risks identified: Fall  Next appointment: 09/15/2019  Preventive Care 40-64 Years, Male Preventive care refers to lifestyle choices and visits with your health care provider that can promote health and wellness. What does preventive care include?  A yearly physical exam. This is also called an annual well check.  Dental exams once or twice a year.  Routine eye exams. Ask your health care provider how often you should have your eyes checked.  Personal lifestyle choices, including:  Daily care of your teeth and gums.  Regular physical activity.  Eating a healthy diet.  Avoiding tobacco and drug use.  Limiting alcohol use.  Practicing safe sex.  Taking low-dose aspirin every day starting at age 32. What happens during an annual well check? The services and screenings done by your health care provider during your annual well check will depend on your age, overall health, lifestyle risk factors, and family history of disease. Counseling  Your health care provider may ask you questions about your:   Alcohol use.  Tobacco use.  Drug use.  Emotional well-being.  Home and relationship well-being.  Sexual activity.  Eating habits.  Work and work Statistician. Screening  You may have the following tests or measurements:  Height, weight, and BMI.  Blood pressure.  Lipid and cholesterol levels. These may be checked every 5 years, or more frequently if you are over 32 years old.  Skin check.  Lung cancer screening. You may have this screening every year starting at age 59 if you have a 30-pack-year history of smoking and currently smoke or have quit within the past 15 years.  Fecal occult blood test (FOBT) of the stool. You may have this test every year starting at age 33.  Flexible sigmoidoscopy or colonoscopy. You may have a sigmoidoscopy every 5 years or a colonoscopy every 10 years starting at age 36.  Prostate cancer screening. Recommendations will vary depending on your family history and other risks.  Hepatitis C blood test.  Hepatitis B blood test.  Sexually transmitted disease (STD) testing.  Diabetes screening. This is done by checking your blood sugar (glucose) after you have not eaten for a while (fasting). You may have this done every 1-3 years. Discuss your test results, treatment options, and if necessary, the need for more tests with your health care provider. Vaccines  Your health care provider may recommend certain vaccines, such as:  Influenza vaccine. This is recommended every year.  Tetanus, diphtheria, and acellular pertussis (Tdap, Td) vaccine. You may need a Td booster every 10 years.  Zoster vaccine. You may need this after age 72.  Pneumococcal 13-valent conjugate (PCV13) vaccine. You may need this if you have  certain conditions and have not been vaccinated.  Pneumococcal polysaccharide (PPSV23) vaccine. You may need one or two doses if you smoke cigarettes or if you have certain conditions. Talk to your health care provider about which  screenings and vaccines you need and how often you need them. This information is not intended to replace advice given to you by your health care provider. Make sure you discuss any questions you have with your health care provider. Document Released: 07/30/2015 Document Revised: 03/22/2016 Document Reviewed: 05/04/2015 Elsevier Interactive Patient Education  2017 [place] Prevention in the Home Falls can cause injuries. They can happen to people of all ages. There are many things you can do to make your home safe and to help prevent falls. What can I do on the outside of my home?  Regularly fix the edges of walkways and driveways and fix any cracks.  Remove anything that might make you trip as you walk through a door, such as a raised step or threshold.  Trim any bushes or trees on the path to your home.  Use bright outdoor lighting.  Clear any walking paths of anything that might make someone trip, such as rocks or tools.  Regularly check to see if handrails are loose or broken. Make sure that both sides of any steps have handrails.  Any raised decks and porches should have guardrails on the edges.  Have any leaves, snow, or ice cleared regularly.  Use sand or salt on walking paths during winter.  Clean up any spills in your garage right away. This includes oil or grease spills. What can I do in the bathroom?  Use night lights.  Install grab bars by the toilet and in the tub and shower. Do not use towel bars as grab bars.  Use non-skid mats or decals in the tub or shower.  If you need to sit down in the shower, use a plastic, non-slip stool.  Keep the floor dry. Clean up any water that spills on the floor as soon as it happens.  Remove soap buildup in the tub or shower regularly.  Attach bath mats securely with double-sided non-slip rug tape.  Do not have throw rugs and other things on the floor that can make you trip. What can I do in the bedroom?  Use  night lights.  Make sure that you have a light by your bed that is easy to reach.  Do not use any sheets or blankets that are too big for your bed. They should not hang down onto the floor.  Have a firm chair that has side arms. You can use this for support while you get dressed.  Do not have throw rugs and other things on the floor that can make you trip. What can I do in the kitchen?  Clean up any spills right away.  Avoid walking on wet floors.  Keep items that you use a lot in easy-to-reach places.  If you need to reach something above you, use a strong step stool that has a grab bar.  Keep electrical cords out of the way.  Do not use floor polish or wax that makes floors slippery. If you must use wax, use non-skid floor wax.  Do not have throw rugs and other things on the floor that can make you trip. What can I do with my stairs?  Do not leave any items on the stairs.  Make sure that there are handrails on both sides of  the stairs and use them. Fix handrails that are broken or loose. Make sure that handrails are as long as the stairways.  Check any carpeting to make sure that it is firmly attached to the stairs. Fix any carpet that is loose or worn.  Avoid having throw rugs at the top or bottom of the stairs. If you do have throw rugs, attach them to the floor with carpet tape.  Make sure that you have a light switch at the top of the stairs and the bottom of the stairs. If you do not have them, ask someone to add them for you. What else can I do to help prevent falls?  Wear shoes that:  Do not have high heels.  Have rubber bottoms.  Are comfortable and fit you well.  Are closed at the toe. Do not wear sandals.  If you use a stepladder:  Make sure that it is fully opened. Do not climb a closed stepladder.  Make sure that both sides of the stepladder are locked into place.  Ask someone to hold it for you, if possible.  Clearly mark and make sure that you  can see:  Any grab bars or handrails.  First and last steps.  Where the edge of each step is.  Use tools that help you move around (mobility aids) if they are needed. These include:  Canes.  Walkers.  Scooters.  Crutches.  Turn on the lights when you go into a dark area. Replace any light bulbs as soon as they burn out.  Set up your furniture so you have a clear path. Avoid moving your furniture around.  If any of your floors are uneven, fix them.  If there are any pets around you, be aware of where they are.  Review your medicines with your doctor. Some medicines can make you feel dizzy. This can increase your chance of falling. Ask your doctor what other things that you can do to help prevent falls. This information is not intended to replace advice given to you by your health care provider. Make sure you discuss any questions you have with your health care provider. Document Released: 04/29/2009 Document Revised: 12/09/2015 Document Reviewed: 08/07/2014 Elsevier Interactive Patient Education  2017 [employer].

## 2019-06-05 NOTE — Progress Notes (Signed)
Subjective:   Wesley Brennan is a 58 y.o. male who presents for Medicare Annual/Subsequent preventive examination.  Location of Patient: Home Location of Provider: Telehealth Consent was obtain for visit to be over via telehealth.  I verified that I am speaking with the correct person using two identifiers.    Review of Systems:    Cardiac Risk Factors include: advanced age (>66men, >65 women);dyslipidemia;hypertension;male gender     Objective:    Vitals: BP 138/82   Pulse 74   Resp 15   Ht 5\' 11"  (1.803 m)   Wt 149 lb (67.6 kg)   BMI 20.78 kg/m   Body mass index is 20.78 kg/m.  Advanced Directives 03/27/2019 08/18/2018 06/03/2018 05/12/2018 01/07/2018 10/30/2016 07/02/2015  Does Patient Have a Medical Advance Directive? No No No No No Yes No  Type of Advance Directive - - - - - Press photographer;Living will -  Does patient want to make changes to medical advance directive? - - - - - No - Patient declined -  Copy of [place] in Chart? - - - - - No - copy requested -  Would patient like information on creating a medical advance directive? No - Patient declined - No - Patient declined No - Patient declined No - Patient declined - No - patient declined information    Tobacco Social History   Tobacco Use  Smoking Status Former Smoker  . Packs/day: 0.10  . Years: 40.00  . Pack years: 4.00  . Types: Cigarettes  . Quit date: 05/05/2019  . Years since quitting: 0.0  Smokeless Tobacco Never Used  Tobacco Comment   states not smoking often      Counseling given: Yes Comment: states not smoking often    Clinical Intake:  Pre-visit preparation completed: Yes  Pain : No/denies pain Pain Score: 0-No pain     BMI - recorded: 20.78 Nutritional Status: BMI of 19-24  Normal Nutritional Risks: None Diabetes: No  How often do you need to have someone help you when you read instructions, pamphlets, or other written materials from your  doctor or pharmacy?: 1 - Never What is the last grade level you completed in school?: 12  Interpreter Needed?: No     Past Medical History:  Diagnosis Date  . Gout   . Hypercholesteremia   . Hypertension    related to drug use which he has quit  . Substance abuse ([place])    alcohol, nicotine, h/o coacaine and marijuana   Past Surgical History:  Procedure Laterality Date  . COLONOSCOPY N/A 07/02/2015   Procedure: COLONOSCOPY;  Surgeon: Danie Binder, MD;  [facility];  Service: Endoscopy;  Laterality: N/A;  1:00 PM  . COLONOSCOPY WITH PROPOFOL N/A 03/27/2019   Procedure: COLONOSCOPY WITH PROPOFOL;  Surgeon: Danie Binder, MD;  Location: [facility];  Service: Endoscopy;  Laterality: N/A;  8:30am  . ELBOW FUSION  1993   left elbow dislocation  . ELBOW FUSION Left 1979  . HERNIA REPAIR  [id number] approx   umbilical hernia  . POLYPECTOMY  03/27/2019   Procedure: POLYPECTOMY;  Surgeon: Danie Binder, MD;  Location: [facility];  Service: Endoscopy;;   Family History  Adopted: Yes  Problem Relation Age of Onset  . Cancer Mother 62       Bone   . Diabetes Mother   . Hypertension Sister   . Stroke Father 27  . Heart disease Father 2  .  Diabetes Father   . Colon cancer Neg Hx    Social History   Socioeconomic History  . Marital status: Single    Spouse name: Not on file  . Number of children: 0  . Years of education: 59  . Highest education level: 12th grade  Occupational History  . Occupation: disabled   Social Needs  . Financial resource strain: Not hard at all  . Food insecurity    Worry: Never true    Inability: Never true  . Transportation needs    Medical: No    Non-medical: No  Tobacco Use  . Smoking status: Former Smoker    Packs/day: 0.10    Years: 40.00    Pack years: 4.00    Types: Cigarettes    Quit date: 05/05/2019    Years since quitting: 0.0  . Smokeless tobacco: Never Used  . Tobacco comment: states not smoking often    Substance and Sexual Activity  . Alcohol use: Yes    Comment: 1 beer daily  . Drug use: No  . Sexual activity: Yes    Birth control/protection: Condom  Lifestyle  . Physical activity    Days per week: 7 days    Minutes per session: 60 min  . Stress: Not at all  Relationships  . Social connections    Talks on phone: More than three times a week    Gets together: Never    Attends religious service: 1 to 4 times per year    Active member of club or organization: No    Attends meetings of clubs or organizations: Never    Relationship status: Never married  Other Topics Concern  . Not on file  Social History Narrative   Lives alone     Outpatient Encounter Medications as of 06/05/2019  Medication Sig  . allopurinol (ZYLOPRIM) 300 MG tablet Take 1 tablet (300 mg total) by mouth daily.  Marland Kitchen amLODipine (NORVASC) 2.5 MG tablet Take 1 tablet (2.5 mg total) by mouth daily.  Marland Kitchen aspirin EC 81 MG tablet Take 81 mg by mouth daily.   No facility-administered encounter medications on file as of 06/05/2019.     Activities of Daily Living In your present state of health, do you have any difficulty performing the following activities: 06/05/2019  Hearing? N  Vision? N  Difficulty concentrating or making decisions? N  Walking or climbing stairs? N  Dressing or bathing? N  Doing errands, shopping? N  Preparing Food and eating ? N  Using the Toilet? N  In the past six months, have you accidently leaked urine? N  Do you have problems with loss of bowel control? N  Managing your Medications? N  Managing your Finances? N  Housekeeping or managing your Housekeeping? N  Some recent data might be hidden    Patient Care Team: Fayrene Helper, MD as PCP - General [facility], Alphonse Guild, MD as PCP - Cardiology (Cardiology) Danie Binder, MD as Consulting Physician (Gastroenterology)   Assessment:   This is a routine wellness examination for Wesley Brennan.  Exercise Activities and Dietary  recommendations Current Exercise Habits: Home exercise routine, Type of exercise: Other - see comments(rides bike), Time (Minutes): > 60, Frequency (Times/Week): 7, Weekly Exercise (Minutes/Week): 0, Intensity: Moderate, Exercise limited by: None identified  Goals    . Quit Smoking     Patient down one a day     . Reduce alcohol intake to 1 servings per day  Fall Risk Fall Risk  06/05/2019 04/08/2019 02/21/2019 01/08/2019 06/03/2018  Falls in the past year? 0 0 0 0 0  Number falls in past yr: 0 0 - - -  Injury with Fall? 0 0 0 0 -  Risk Factor Category  - - - - -  Risk for fall due to : - - - - -   Is the patient's home free of loose throw rugs in walkways, pet beds, electrical cords, etc?   yes      Grab bars in the bathroom? yes      Handrails on the stairs?   yes      Adequate lighting?   yes     Depression Screen PHQ 2/9 Scores 06/05/2019 04/08/2019 02/21/2019 01/08/2019  PHQ - 2 Score 0 0 0 0  PHQ- 9 Score - - - -    Cognitive Function     6CIT Screen 06/05/2019 06/03/2018 10/30/2016  What Year? 0 points 4 points 0 points  What month? 0 points 0 points 0 points  What time? 0 points 0 points 0 points  Count back from 20 0 points 0 points 0 points  Months in reverse 4 points 4 points 0 points  Repeat phrase 2 points 0 points 0 points  Total Score 6 8 0    Immunization History  Administered Date(s) Administered  . H1N1 06/26/2008  . Influenza Split 05/01/2012  . Influenza Whole 04/11/2006  . Influenza,inj,Quad PF,6+ Mos 05/02/2013, 04/27/2014, 06/17/2015, 03/08/2016, 04/18/2017, 04/05/2018, 04/08/2019  . Pneumococcal Conjugate-13 09/09/2014  . Pneumococcal Polysaccharide-23 06/03/2018  . Td 04/17/2006  . Tdap 05/26/2016    Qualifies for Shingles Vaccine?  Checking coverage  Screening Tests Health Maintenance  Topic Date Due  . TETANUS/TDAP  05/26/2026  . COLONOSCOPY  03/26/2029  . INFLUENZA VACCINE  Completed  . Hepatitis C Screening  Completed  . HIV  Screening  Completed   Cancer Screenings: Lung: Low Dose CT Chest recommended if Age 63-80 years, 30 pack-year currently smoking OR have quit w/in 15years. Patient does qualify. Colorectal:  Due 03/2029  Additional Screenings:   Hepatitis C Screening: Completed    Plan:       1. Encounter for Medicare annual wellness exam   I have personally reviewed and noted the following in the patient's chart:   . Medical and social history . Use of alcohol, tobacco or illicit drugs  . Current medications and supplements . Functional ability and status . Nutritional status . Physical activity . Advanced directives . List of other physicians . Hospitalizations, surgeries, and ER visits in previous 12 months . Vitals . Screenings to include cognitive, depression, and falls . Referrals and appointments  In addition, I have reviewed and discussed with patient certain preventive protocols, quality metrics, and best practice recommendations. A written personalized care plan for preventive services as well as general preventive health recommendations were provided to patient.    I provided 20 minutes of non-face-to-face time during this encounter.    Perlie Mayo, NP  06/05/2019

## 2019-06-23 ENCOUNTER — Ambulatory Visit: Payer: Medicare Other

## 2019-06-25 ENCOUNTER — Other Ambulatory Visit: Payer: Self-pay

## 2019-06-25 DIAGNOSIS — M1A0710 Idiopathic chronic gout, right ankle and foot, without tophus (tophi): Secondary | ICD-10-CM

## 2019-06-25 DIAGNOSIS — I10 Essential (primary) hypertension: Secondary | ICD-10-CM

## 2019-06-25 MED ORDER — ALLOPURINOL 300 MG PO TABS: 300.0000 mg | ORAL_TABLET | Freq: Every day | ORAL | 0 refills | Status: DC

## 2019-06-25 MED ORDER — AMLODIPINE BESYLATE 2.5 MG PO TABS: 2.5000 mg | ORAL_TABLET | Freq: Every day | ORAL | 0 refills | Status: DC

## 2019-08-20 ENCOUNTER — Ambulatory Visit: Payer: Medicare HMO | Admitting: Gastroenterology

## 2019-08-21 NOTE — Progress Notes (Signed)
Referring Provider: Fayrene Helper, MD Primary Care Physician:  Fayrene Helper, MD Primary GI Physician: Dr. Oneida Alar  Chief Complaint  Patient presents with  . Elevated Hepatic Enzymes    Elevated LFT's    HPI:   Wesley Brennan is a 59 y.o. male with a history of history significant for HTN, hypercholesterolemia, colon polyps, alcohol and substance abuse including cocaine and marijuana. Colonoscopy on 03/27/2019 with two 2-4 mm polyps, one 12 mm polyp with clips placed, and tortuous left colon.  Pathology with tubular adenomas.  Next colonoscopy in 2023.   He presents today for follow-up of elevated LFTs.  Last seen in our office on 05/07/2019 for the same. Slight elevation of AST first noted in 2014, returned to normal in 2016, then increased again in 2018 and had been steady increasing along with elevation of ALT first noted in 2019.  Most recent labs at the time of his office visit from September 2020 with AST 289, ALT 169, alk phos and total bilirubin normal.  RUQ ultrasound in July 2020 within normal limits.  He reported decreasing his alcohol intake from 40 ounce beer a day and 1/5 of wine a few times a day to one 12 ounce beer a day starting in September 2020.  He denied drug use since the 1980s although UDS positive for cocaine in 2016.  Prior hepatitis panel negative in 2014.  No signs or symptoms of advanced liver disease.  With AST: ALT essentially 2:1, suspected LFTs elevation secondary to alcohol abuse.  Plan to update labs, recheck hepatitis B and C serologies and check for underlying autoimmune conditions.  Advised he continue to work towards abstinence of alcohol.  Labs completed on 05/08/2019.  LFTs were significantly improved with AST 91, ALT normal at 43.  Hepatitis B and C negative.  No immunity to hepatitis B.  ANA, AMA, ASMA, ceruloplasmin, alpha-1 antitrypsin all negative.  Immunoglobulins without significant findings.  Slight elevation of IgA at 314 (upper limit  of normal 310) and slightly low IgM at 48 (lower limit normal 50).  Ferritin elevated at 1530, iron elevated at 215, percent saturation elevated at 73%.  Labs were placed for hemochromatosis but these were not completed.  Today: Continued to work on decreasing alcohol consumption. Now drinking 16 oz can every other day. No drug use. Will use an arthritis cream as needed. No Tylenol or OTC supplements. No swelling in his abdomen or lower extremities. No yellowing of eyes. No confusion. No bruising or bleeding.   No abdominal pain, nausea, vomiting, reflux/heartburn symptoms, or dysphagia. BMs daily. No constipation or diarrhea. No blood in the stool or melena.   Denies fever, chills, cold or flulike symptoms, lightheadedness, dizziness, presyncope, syncope, chest pain, heart palpitations, shortness of breath, cough.  Past Medical History:  Diagnosis Date  . Gout   . Hypercholesteremia   . Hypertension    related to drug use which he has quit  . Substance abuse ([place])    alcohol, nicotine, h/o coacaine and marijuana    Past Surgical History:  Procedure Laterality Date  . COLONOSCOPY N/A 07/02/2015   Procedure: COLONOSCOPY;  Surgeon: Danie Binder, MD;  [facility];  Service: Endoscopy;  Laterality: N/A;  1:00 PM  . COLONOSCOPY WITH PROPOFOL N/A 03/27/2019   Procedure: COLONOSCOPY WITH PROPOFOL;  Surgeon: Danie Binder, MD;  two 2-4 mm polyps, one 12 mm polyp with clips placed, and tortuous left colon.  Pathology with tubular adenomas.  Next colonoscopy in 2023.   [place]   left elbow dislocation  . ELBOW FUSION Left 1979  . HERNIA REPAIR  [id number] approx   umbilical hernia  . POLYPECTOMY  03/27/2019   Procedure: POLYPECTOMY;  Surgeon: Danie Binder, MD;  [facility];  Service: Endoscopy;;    Current Outpatient Medications  Medication Sig Dispense Refill  . allopurinol (ZYLOPRIM) 300 MG tablet Take 1 tablet (300 mg total) by mouth daily. 90 tablet 0   . amLODipine (NORVASC) 2.5 MG tablet Take 1 tablet (2.5 mg total) by mouth daily. 90 tablet 0  . aspirin EC 81 MG tablet Take 81 mg by mouth daily.     No current facility-administered medications for this visit.    Allergies as of 08/22/2019  . (No Known Allergies)    Family History  Adopted: Yes  Problem Relation Age of Onset  . Cancer Mother 62       Bone   . Diabetes Mother   . Hypertension Sister   . Stroke Father 55  . Heart disease Father 12  . Diabetes Father   . Colon cancer Neg Hx     Social History   Socioeconomic History  . Marital status: Single    Spouse name: Not on file  . Number of children: 0  . Years of education: 18  . Highest education level: 12th grade  Occupational History  . Occupation: disabled   Tobacco Use  . Smoking status: Former Smoker    Packs/day: 0.10    Years: 40.00    Pack years: 4.00    Types: Cigarettes    Quit date: 05/05/2019    Years since quitting: 0.2  . Smokeless tobacco: Never Used  . Tobacco comment: states not smoking often   Substance and Sexual Activity  . Alcohol use: Yes    Comment: 1 beer every other day  . Drug use: No  . Sexual activity: Yes    Birth control/protection: Condom  Other Topics Concern  . Not on file  Social History Narrative   Lives alone    Social Determinants of Health   Financial Resource Strain:   . Difficulty of Paying Living Expenses: Not on file  Food Insecurity:   . Worried About Charity fundraiser in the Last Year: Not on file  . Ran Out of Food in the Last Year: Not on file  Transportation Needs:   . Lack of Transportation (Medical): Not on file  . Lack of Transportation (Non-Medical): Not on file  Physical Activity:   . Days of Exercise per Week: Not on file  . Minutes of Exercise per Session: Not on file  Stress:   . Feeling of Stress : Not on file  Social Connections:   . Frequency of Communication with Friends and Family: Not on file  . Frequency of Social  Gatherings with Friends and Family: Not on file  . Attends Religious Services: Not on file  . Active Member of Clubs or Organizations: Not on file  . Attends Archivist Meetings: Not on file  . Marital Status: Not on file    Review of Systems: Gen: See HPI CV: See HPI Resp: See HPI GI: See HPI Derm: Denies rash Psych: Denies depression or anxiety Heme: See HPI  Physical Exam: BP 130/81   Pulse (!) 106   Temp (!) 96.6 F (35.9 C) (Temporal)   Ht '5\' 11"'$  (1.803 m)   Wt 146 lb  6.4 oz (66.4 kg)   BMI 20.42 kg/m  General:   Alert and oriented. No distress noted. Pleasant and cooperative.  Head:  Normocephalic and atraumatic. Eyes:  Conjuctiva clear without scleral icterus. Heart:  S1, S2 present without murmurs appreciated. Lungs:  Clear to auscultation bilaterally. No wheezes, rales, or rhonchi. No distress.  Abdomen:  +BS, soft, non-tender and non-distended. No rebound or guarding. No HSM or masses noted. Msk:  Symmetrical without gross deformities. Normal posture. Extremities:  Without edema. Neurologic:  Alert and  oriented x4 Psych: Normal mood and affect.

## 2019-08-22 ENCOUNTER — Encounter: Payer: Self-pay | Admitting: Gastroenterology

## 2019-08-22 ENCOUNTER — Ambulatory Visit (INDEPENDENT_AMBULATORY_CARE_PROVIDER_SITE_OTHER): Payer: Medicare HMO | Admitting: Gastroenterology

## 2019-08-22 ENCOUNTER — Other Ambulatory Visit: Payer: Self-pay

## 2019-08-22 VITALS — BP 130/81 | HR 106 | Temp 96.6°F | Ht 71.0 in | Wt 146.4 lb

## 2019-08-22 DIAGNOSIS — R7989 Other specified abnormal findings of blood chemistry: Secondary | ICD-10-CM | POA: Diagnosis not present

## 2019-08-22 NOTE — Progress Notes (Signed)
Cc'ed to pcp °

## 2019-08-22 NOTE — Assessment & Plan Note (Signed)
59 year old male with past medical history of HTN, HLD, colon polyps, and alcohol and substance abuse including cocaine and marijuana who presents today to follow-up on elevated LFTs.  Intermittent elevation of AST since 2014, ALT first elevated in 2019. RUQ ultrasound in July 2020 within normal limits.  Labs completed in October 2020 with AST 91 (down from 289), ALT normal at 43 (down from 169), alk phos and total bilirubin normal.  Platelets normal. Hepatitis B and hepatitis C negative.  ANA, AMA, ASMA, ceruloplasmin, alpha-1 antitrypsin all negative.  Immunoglobulins without significant findings. Ferritin elevated at 1530, iron elevated at 215, percent saturation elevated at 73%.  Hemochromatosis labs were ordered but not completed.  He is without signs or symptoms of advanced liver disease.  No significant upper or lower GI symptoms.  He has cut back from 40 ounces of beer a day and 1/5 of wine few times a day to 16 ounces beer every other day.  Denies recent drug use.  No Tylenol or over-the-counter supplements.  As AST: ALT was essentially 2:1 ratio in September 2020 and have now significantly improved with decreasing alcohol intake, I suspect LFT elevation was secondary to chronic alcohol use.  However, with significantly elevated ferritin, percent saturation ratio, and total iron, we need to evaluate for hemochromatosis.  Update HFP and complete hemochromatosis labs. Counseled on importance of working towards abstinence of alcohol and congratulated him on his progress. Advised to continue to avoid Tylenol and over-the-counter supplements. Plan to follow-up in 6 months.

## 2019-08-22 NOTE — Patient Instructions (Signed)
Please have blood work completed on Monday.  You are doing a great job on decreasing your alcohol intake.  Please continue to work towards abstinence as I suspect this is likely the cause of your elevated liver numbers.  We will plan to follow-up with you in the office in 6 months.  Should she have questions or concerns prior, do not hesitate to call.  Aliene Altes, PA-C [facility]

## 2019-08-22 NOTE — Assessment & Plan Note (Signed)
Addressed under elevated LFTs. 

## 2019-08-26 DIAGNOSIS — I10 Essential (primary) hypertension: Secondary | ICD-10-CM | POA: Diagnosis not present

## 2019-08-26 DIAGNOSIS — E785 Hyperlipidemia, unspecified: Secondary | ICD-10-CM | POA: Diagnosis not present

## 2019-08-26 DIAGNOSIS — Z125 Encounter for screening for malignant neoplasm of prostate: Secondary | ICD-10-CM | POA: Diagnosis not present

## 2019-08-26 DIAGNOSIS — E790 Hyperuricemia without signs of inflammatory arthritis and tophaceous disease: Secondary | ICD-10-CM | POA: Diagnosis not present

## 2019-08-26 DIAGNOSIS — E559 Vitamin D deficiency, unspecified: Secondary | ICD-10-CM | POA: Diagnosis not present

## 2019-08-26 DIAGNOSIS — R7989 Other specified abnormal findings of blood chemistry: Secondary | ICD-10-CM | POA: Diagnosis not present

## 2019-08-26 NOTE — Progress Notes (Signed)
LFTs have increased again. AST 366, ALT 170, Alk phos 154, and total bilirubin 1.5. Spoke with patient.  He admits to drinking a 40 ounce beer daily.  Denies abdominal pain, nausea, or vomiting.  Explained the importance of stopping all alcohol at this time due to concerns for developing alcoholic hepatitis if he were to continue.  Patient voiced understanding and stated he would stop drinking alcohol.   Doris: Please arrange for HFP in 1 week. (09/02/2019).

## 2019-08-27 ENCOUNTER — Telehealth: Payer: Self-pay | Admitting: Cardiology

## 2019-08-27 ENCOUNTER — Other Ambulatory Visit: Payer: Self-pay | Admitting: Family Medicine

## 2019-08-27 DIAGNOSIS — E782 Mixed hyperlipidemia: Secondary | ICD-10-CM

## 2019-08-27 LAB — COMPLETE METABOLIC PANEL WITH GFR
AG Ratio: 1.2 (calc) (ref 1.0–2.5)
ALT: 173 U/L — ABNORMAL HIGH (ref 9–46)
AST: 366 U/L — ABNORMAL HIGH (ref 10–35)
Albumin: 3.7 g/dL (ref 3.6–5.1)
Alkaline phosphatase (APISO): 157 U/L — ABNORMAL HIGH (ref 35–144)
BUN: 20 mg/dL (ref 7–25)
CO2: 24 mmol/L (ref 20–32)
Calcium: 9.3 mg/dL (ref 8.6–10.3)
Chloride: 100 mmol/L (ref 98–110)
Creat: 1.11 mg/dL (ref 0.70–1.25)
GFR, Est African American: 83 mL/min/{1.73_m2} (ref >=60)
GFR, Est Non African American: 72 mL/min/{1.73_m2} (ref >=60)
Globulin: 3 g/dL (calc) (ref 1.9–3.7)
Glucose, Bld: 88 mg/dL (ref 65–99)
Potassium: 4.6 mmol/L (ref 3.5–5.3)
Sodium: 134 mmol/L — ABNORMAL LOW (ref 135–146)
Total Bilirubin: 1.5 mg/dL — ABNORMAL HIGH (ref 0.2–1.2)
Total Protein: 6.7 g/dL (ref 6.1–8.1)

## 2019-08-27 LAB — URIC ACID: Uric Acid, Serum: 5 mg/dL (ref 4.0–8.0)

## 2019-08-27 LAB — CBC
HCT: 40.4 % (ref 38.5–50.0)
Hemoglobin: 14.2 g/dL (ref 13.2–17.1)
MCH: 35.2 pg — ABNORMAL HIGH (ref 27.0–33.0)
MCHC: 35.1 g/dL (ref 32.0–36.0)
MCV: 100.2 fL — ABNORMAL HIGH (ref 80.0–100.0)
MPV: 11 fL (ref 7.5–12.5)
Platelets: 152 10*3/uL (ref 140–400)
RBC: 4.03 10*6/uL — ABNORMAL LOW (ref 4.20–5.80)
RDW: 11.8 % (ref 11.0–15.0)
WBC: 5.2 10*3/uL (ref 3.8–10.8)

## 2019-08-27 LAB — LIPID PANEL
Cholesterol: 367 mg/dL — ABNORMAL HIGH (ref <200)
HDL: 21 mg/dL — ABNORMAL LOW (ref >=40)
Non-HDL Cholesterol (Calc): 346 mg/dL (calc) — ABNORMAL HIGH (ref <130)
Total CHOL/HDL Ratio: 17.5 (calc) — ABNORMAL HIGH (ref <5.0)
Triglycerides: 2246 mg/dL — ABNORMAL HIGH (ref <150)

## 2019-08-27 LAB — PSA: PSA: 1.5 ng/mL (ref <=4.0)

## 2019-08-27 LAB — VITAMIN D 25 HYDROXY (VIT D DEFICIENCY, FRACTURES): Vit D, 25-Hydroxy: 26 ng/mL — ABNORMAL LOW (ref 30–100)

## 2019-08-27 NOTE — Telephone Encounter (Signed)

## 2019-08-28 ENCOUNTER — Other Ambulatory Visit: Payer: Self-pay

## 2019-08-28 DIAGNOSIS — R7989 Other specified abnormal findings of blood chemistry: Secondary | ICD-10-CM

## 2019-08-28 NOTE — Progress Notes (Signed)
hepatic 

## 2019-09-01 LAB — HEPATIC FUNCTION PANEL
AG Ratio: 1.3 (calc) (ref 1.0–2.5)
ALT: 170 U/L — ABNORMAL HIGH (ref 9–46)
AST: 366 U/L — ABNORMAL HIGH (ref 10–35)
Albumin: 3.8 g/dL (ref 3.6–5.1)
Alkaline phosphatase (APISO): 154 U/L — ABNORMAL HIGH (ref 35–144)
Bilirubin, Direct: 0.6 mg/dL — ABNORMAL HIGH (ref 0.0–0.2)
Globulin: 2.9 g/dL (calc) (ref 1.9–3.7)
Indirect Bilirubin: 0.9 mg/dL (calc) (ref 0.2–1.2)
Total Bilirubin: 1.5 mg/dL — ABNORMAL HIGH (ref 0.2–1.2)
Total Protein: 6.7 g/dL (ref 6.1–8.1)

## 2019-09-01 LAB — HEMOCHROMATOSIS DNA-PCR(C282Y,H63D)

## 2019-09-02 ENCOUNTER — Other Ambulatory Visit: Payer: Self-pay

## 2019-09-02 ENCOUNTER — Telehealth: Payer: Self-pay | Admitting: Gastroenterology

## 2019-09-02 DIAGNOSIS — R7989 Other specified abnormal findings of blood chemistry: Secondary | ICD-10-CM

## 2019-09-02 NOTE — Telephone Encounter (Signed)
See result note: I have called pt and left Vm for a return call.

## 2019-09-02 NOTE — Telephone Encounter (Signed)
Patient returned call, please call back  

## 2019-09-03 ENCOUNTER — Encounter: Payer: Self-pay | Admitting: Cardiology

## 2019-09-03 ENCOUNTER — Telehealth (INDEPENDENT_AMBULATORY_CARE_PROVIDER_SITE_OTHER): Payer: Medicare HMO | Admitting: Cardiology

## 2019-09-03 ENCOUNTER — Telehealth: Payer: Self-pay | Admitting: Licensed Clinical Social Worker

## 2019-09-03 DIAGNOSIS — R0789 Other chest pain: Secondary | ICD-10-CM | POA: Diagnosis not present

## 2019-09-03 DIAGNOSIS — E782 Mixed hyperlipidemia: Secondary | ICD-10-CM

## 2019-09-03 MED ORDER — ICOSAPENT ETHYL 1 G PO CAPS: 2.0000 g | ORAL_CAPSULE | Freq: Two times a day (BID) | ORAL | 3 refills | Status: DC

## 2019-09-03 NOTE — Addendum Note (Signed)
Addended byDebbora Lacrosse R on: 09/03/2019 11:26 AM   Modules accepted: Orders

## 2019-09-03 NOTE — Progress Notes (Signed)
Virtual Visit via Telephone Note   This visit type was conducted due to national recommendations for restrictions regarding the COVID-19 Pandemic (e.g. social distancing) in an effort to limit this patient's exposure and mitigate transmission in our community.  Due to his co-morbid illnesses, this patient is at least at moderate risk for complications without adequate follow up.  This format is felt to be most appropriate for this patient at this time.  The patient did not have access to video technology/had technical difficulties with video requiring transitioning to audio format only (telephone).  All issues noted in this document were discussed and addressed.  No physical exam could be performed with this format.  Please refer to the patient's chart for his  consent to telehealth for [facility].   Date:  09/03/2019   ID:  Wesley Brennan, DOB 1960-11-27, MRN [id number]  Patient Location: Home Provider Location: Office  PCP:  Fayrene Helper, MD  Cardiologist:  Carlyle Dolly, MD  Electrophysiologist:  None   Evaluation Performed:  Follow-Up Visit  Chief Complaint:  Follow up  History of Present Illness:    Wesley Brennan is a 59 y.o. male with seen today for follow up of the following medical problems.    1. Chest pain - admit 12/2017 with atypical chest pain.   - described at that time as sharp pain left chest, can be up to 10/10. Can have some chills associated, no other symptoms. Can last up to 2-3 hours constantly. Can be positional, worst with laying on left side, better with laying on right side - very physically active, rides his bike up to 2 miles a day, had no exertional symptoms. Uses pushing mower without symptoms.  - trops negative, EKG nonspecific changes - 12/2017 nuclear stress suggests inferior infarct though may be artifact, no ischemia. LVEF 34% - 12/2017 echo LVEF 50%, no WMAs   - no recent symptoms.  - riding bike regularly up to 2 miles a day.     2. Abnormal LFTs - followed by GI, thought to be EtOH related    3. Hyperlipidemia - elevated TGs 2200, collected 2/9 at 730AM. I have confimred with him this was a fasting sample - working to wean [facility], previously was using regularly.  03/2019 TC 233 HDL 45 TG 1500     The patient does not have symptoms concerning for COVID-19 infection (fever, chills, cough, or new shortness of breath).    Past Medical History:  Diagnosis Date  . Gout   . Hypercholesteremia   . Hypertension    related to drug use which he has quit  . Substance abuse ([place])    alcohol, nicotine, h/o coacaine and marijuana   Past Surgical History:  Procedure Laterality Date  . COLONOSCOPY N/A 07/02/2015   Procedure: COLONOSCOPY;  Surgeon: Danie Binder, MD;  [facility];  Service: Endoscopy;  Laterality: N/A;  1:00 PM  . COLONOSCOPY WITH PROPOFOL N/A 03/27/2019   Procedure: COLONOSCOPY WITH PROPOFOL;  Surgeon: Danie Binder, MD;  two 2-4 mm polyps, one 12 mm polyp with clips placed, and tortuous left colon.  Pathology with tubular adenomas.  Next colonoscopy in 2023.   [place]   left elbow dislocation  . ELBOW FUSION Left 1979  . HERNIA REPAIR  [id number] approx   umbilical hernia  . POLYPECTOMY  03/27/2019   Procedure: POLYPECTOMY;  Surgeon: Danie Binder, MD;  [facility];  Service: Endoscopy;;  No outpatient medications have been marked as taking for the 09/03/19 encounter (Appointment) with Arnoldo Lenis, MD.     Allergies:   Patient has no known allergies.   Social History   Tobacco Use  . Smoking status: Former Smoker    Packs/day: 0.10    Years: 40.00    Pack years: 4.00    Types: Cigarettes    Quit date: 05/05/2019    Years since quitting: 0.3  . Smokeless tobacco: Never Used  . Tobacco comment: states not smoking often   Substance Use Topics  . Alcohol use: Yes    Comment: 1 beer every other day  . Drug use: No     Family Hx: The  patient's family history includes Cancer (age of onset: 76) in his mother; Diabetes in his father and mother; Heart disease (age of onset: 39) in his father; Hypertension in his sister; Stroke (age of onset: 46) in his father. There is no history of Colon cancer. He was adopted.  ROS:   Please see the history of present illness.     All other systems reviewed and are negative.   Prior CV studies:   The following studies were reviewed today:  Labs/Other Tests and Data Reviewed:    EKG:  No ECG reviewed.  Recent Labs: 01/08/2019: TSH 1.19 08/26/2019: ALT 173; BUN 20; Creat 1.11; Hemoglobin 14.2; Platelets 152; Potassium 4.6; Sodium 134   Recent Lipid Panel Lab Results  Component Value Date/Time   CHOL 367 (H) 08/26/2019 07:30 AM   TRIG 2,246 (H) 08/26/2019 07:30 AM   HDL 21 (L) 08/26/2019 07:30 AM   CHOLHDL 17.5 (H) 08/26/2019 07:30 AM   LDLCALC  08/26/2019 07:30 AM     Comment:     . LDL cholesterol not calculated. Triglyceride levels greater than 400 mg/dL invalidate calculated LDL results. . Reference range: <100 . Desirable range <100 mg/dL for primary prevention;   <70 mg/dL for patients with CHD or diabetic patients  with > or = 2 CHD risk factors. Marland Kitchen LDL-C is now calculated using the Martin-Hopkins  calculation, which is a validated novel method providing  better accuracy than the Friedewald equation in the  estimation of LDL-C.  Cresenciano Genre et al. Annamaria Helling. [id number]): 2061-2068  (http://education.[facility].com/faq/FAQ164)     Wt Readings from Last 3 Encounters:  08/22/19 146 lb 6.4 oz (66.4 kg)  06/05/19 149 lb (67.6 kg)  05/07/19 149 lb 12.8 oz (67.9 kg)     Objective:    Vital Signs:  There were no vitals filed for this visit. There is no height or weight on file to calculate BMI.  NOmral affect. Normal speech pattern and tone. Comfortable, no apparent distress. No audible signs of SOB or wheezing.   ASSESSMENT & PLAN:    1. Chest pain - atypical  symptoms that have resolved. Probable artifact on nuclear stress test, no evidence of ischemia. Echo with normal LVEF and no WMAs. Tolerates high levels of exertion daily without symptoms - no recurrent symptoms since last visit, continue to monitor   2. Hyperlipidemia - likely related to heavy EtOH use he is weaning - severely elevated TGs to the level where treatment is indicated. Would avoid fenofibrate given his elevated LFTs. Would start vascepa 2g bid.    F/u 2 months, recheck lipids at that time.   COVID-19 Education: The signs and symptoms of COVID-19 were discussed with the patient and how to seek care for testing (follow up with PCP or arrange E-visit).  The importance of social distancing was discussed today.  Time:   Today, I have spent 20 minutes with the patient with telehealth technology discussing the above problems.     Medication Adjustments/Labs and Tests Ordered: Current medicines are reviewed at length with the patient today.  Concerns regarding medicines are outlined above.   Tests Ordered: No orders of the defined types were placed in this encounter.   Medication Changes: No orders of the defined types were placed in this encounter.   Follow Up:  Virtual Visit  in 2 month(s)  Signed, Carlyle Dolly, MD  09/03/2019 9:38 AM    [facility]

## 2019-09-03 NOTE — Telephone Encounter (Signed)
CSW referred to assist patient with obtaining a scale and BP cuff. CSW contacted patient to inform scale and cuff will be delivered to home. Patient grateful for support and assistance. CSW available as needed. Jackie Awesome Jared, LCSW, CCSW-MCS [phone]  

## 2019-09-03 NOTE — Patient Instructions (Signed)
Medication Instructions:  Start vascepa 2 g- two times daily   Labwork: none  Testing/Procedures: none  Follow-Up: Your physician recommends that you schedule a follow-up appointment in: 2 months    Any Other Special Instructions Will Be Listed Below (If Applicable).     If you need a refill on your cardiac medications before your next appointment, please call your pharmacy.

## 2019-09-10 ENCOUNTER — Encounter (HOSPITAL_COMMUNITY): Payer: Self-pay | Admitting: *Deleted

## 2019-09-11 ENCOUNTER — Ambulatory Visit (HOSPITAL_COMMUNITY): Payer: Medicare HMO | Admitting: Hematology

## 2019-09-15 ENCOUNTER — Other Ambulatory Visit: Payer: Self-pay

## 2019-09-15 ENCOUNTER — Encounter: Payer: Self-pay | Admitting: Family Medicine

## 2019-09-15 ENCOUNTER — Ambulatory Visit (INDEPENDENT_AMBULATORY_CARE_PROVIDER_SITE_OTHER): Payer: Medicare HMO | Admitting: Family Medicine

## 2019-09-15 VITALS — BP 120/78 | HR 71 | Temp 97.3°F | Ht 71.0 in | Wt 157.0 lb

## 2019-09-15 DIAGNOSIS — F10229 Alcohol dependence with intoxication, unspecified: Secondary | ICD-10-CM | POA: Diagnosis not present

## 2019-09-15 DIAGNOSIS — I10 Essential (primary) hypertension: Secondary | ICD-10-CM | POA: Diagnosis not present

## 2019-09-15 DIAGNOSIS — E785 Hyperlipidemia, unspecified: Secondary | ICD-10-CM

## 2019-09-15 DIAGNOSIS — R7989 Other specified abnormal findings of blood chemistry: Secondary | ICD-10-CM

## 2019-09-15 NOTE — Patient Instructions (Addendum)
F/u in office with mD in 4 months, call if you need me sooner  As I discussed with you and your sister via telephone today, you NEED to go into rehab, you stated to me you are ready, and then when we diiscussed further you stated NOT TODAY You also state that in patient rehab is the only treatment that works  Please DECEIDE to get your needed inpatient rehab no later than this week  I am referring  You back to hematology re elevated iron and ferritin  LFT;s today

## 2019-09-15 NOTE — Progress Notes (Signed)
   Wesley Brennan     MRN: [id number]      DOB: [birth year]   HPI Wesley Brennan is here for follow up and re-evaluation of chronic medical conditions, medication management and review of any available recent lab and radiology data.  Preventive health is updated, specifically  Cancer screening and Immunization.   Questions or concerns regarding consultations or procedures which the PT has had in the interim are  addressed. The PT denies any adverse reactions to current medications since the last visit.  There are no new concerns.  There are no specific complaints   ROS Denies recent fever or chills. Denies sinus pressure, nasal congestion, ear pain or sore throat. Denies chest congestion, productive cough or wheezing. Denies chest pains, palpitations and leg swelling Denies abdominal pain, nausea, vomiting,diarrhea or constipation.   Denies dysuria, frequency, hesitancy or incontinence. Denies joint pain, swelling and limitation in mobility. Denies headaches, seizures, numbness, or tingling.  Denies skin break down or rash.   PE  BP (!) 144/82 (BP Location: Left Arm, Patient Position: Sitting)   Pulse 71   Temp (!) 97.3 F (36.3 C) (Temporal)   Ht 5\' 11"  (1.803 m)   Wt 157 lb (71.2 kg)   SpO2 95%   BMI 21.90 kg/m   Patient alert and oriented and in no cardiopulmonary distress.Became increasingly agitated after discussion with sister re going to rehab and that the decision is his  HEENT: No facial asymmetry, EOMI,     Neck supple .  Chest: Clear to auscultation bilaterally.  CVS: S1, S2 no murmurs, no S3.Regular rate.  ABD: Soft non tender.   Ext: No edema  MS: Adequate ROM spine, shoulders, hips and knees.  Skin: Intact, no ulcerations or rash noted.  Psych: Good eye contact, normal affect. Memory intact not anxious or depressed appearing.  CNS: CN 2-12 intact, power,  normal throughout.no focal deficits noted.   Assessment & Plan  Essential hypertension  Uncontrolled Still has alcohol addiction  Alcohol dependence [facility]) Direct contact made with his sister , Deboraha Sprang, [address] [place], [place] [address] [phone] Pt stated he knows he needs in patient rehab, and then backed away from this , he is aware that he has severe liver disease because of alcohol addiction  Elevated ferritin Refer back to hematology, elevated ferritin level , needs hematology eval, he has agreed  Hyperlipidemia LDL goal <100 Hyperlipidemia:Low fat diet discussed and encouraged.   Lipid Panel  Lab Results  Component Value Date   CHOL 367 (H) 08/26/2019   HDL 21 (L) 08/26/2019   [place]  08/26/2019     Comment:     . LDL cholesterol not calculated. Triglyceride levels greater than 400 mg/dL invalidate calculated LDL results. . Reference range: <100 . Desirable range <100 mg/dL for primary prevention;   <70 mg/dL for patients with CHD or diabetic patients  with > or = 2 CHD risk factors. Marland Kitchen LDL-C is now calculated using the Martin-Hopkins  calculation, which is a validated novel method providing  better accuracy than the Friedewald equation in the  estimation of LDL-C.  Cresenciano Genre et al. Annamaria Helling. [id number]): 2061-2068  (http://education.[facility].com/faq/FAQ164)    TRIG 2,246 (H) 08/26/2019   CHOLHDL 17.5 (H) 08/26/2019     Managed by Cardiology  Elevated LFTs Related to excess alcohol, needs to go to rehab, still resisting

## 2019-09-15 NOTE — Assessment & Plan Note (Addendum)
Refer back to hematology, elevated ferritin level , needs hematology eval, he has agreed

## 2019-09-15 NOTE — Assessment & Plan Note (Addendum)
Direct contact made with his sister , Deboraha Sprang, [address], [place], [place] [address] [phone] Pt stated he knows he needs in patient rehab, and then backed away from this , he is aware that he has severe liver disease because of alcohol addiction

## 2019-09-15 NOTE — Assessment & Plan Note (Signed)
Uncontrolled Still has alcohol addiction

## 2019-09-20 ENCOUNTER — Encounter: Payer: Self-pay | Admitting: Family Medicine

## 2019-09-20 NOTE — Assessment & Plan Note (Signed)
Hyperlipidemia:Low fat diet discussed and encouraged.   Lipid Panel  Lab Results  Component Value Date   CHOL 367 (H) 08/26/2019   HDL 21 (L) 08/26/2019   [place]  08/26/2019     Comment:     . LDL cholesterol not calculated. Triglyceride levels greater than 400 mg/dL invalidate calculated LDL results. . Reference range: <100 . Desirable range <100 mg/dL for primary prevention;   <70 mg/dL for patients with CHD or diabetic patients  with > or = 2 CHD risk factors. Marland Kitchen LDL-C is now calculated using the Martin-Hopkins  calculation, which is a validated novel method providing  better accuracy than the Friedewald equation in the  estimation of LDL-C.  Cresenciano Genre et al. Annamaria Helling. [id number]): 2061-2068  (http://education.[facility].com/faq/FAQ164)    TRIG 2,246 (H) 08/26/2019   CHOLHDL 17.5 (H) 08/26/2019     Managed by Cardiology

## 2019-09-20 NOTE — Assessment & Plan Note (Signed)
Related to excess alcohol, needs to go to rehab, still resisting

## 2019-09-25 ENCOUNTER — Other Ambulatory Visit: Payer: Self-pay

## 2019-09-25 ENCOUNTER — Emergency Department (HOSPITAL_COMMUNITY)
Admission: EM | Admit: 2019-09-25 | Discharge: 2019-09-25 | Disposition: A | Discharge status: Home / Self Care | Payer: Medicare HMO | Attending: Emergency Medicine | Admitting: Emergency Medicine

## 2019-09-25 ENCOUNTER — Encounter (HOSPITAL_COMMUNITY): Payer: Self-pay | Admitting: Emergency Medicine

## 2019-09-25 DIAGNOSIS — F17210 Nicotine dependence, cigarettes, uncomplicated: Secondary | ICD-10-CM | POA: Insufficient documentation

## 2019-09-25 DIAGNOSIS — R52 Pain, unspecified: Secondary | ICD-10-CM | POA: Diagnosis not present

## 2019-09-25 DIAGNOSIS — I10 Essential (primary) hypertension: Secondary | ICD-10-CM | POA: Diagnosis not present

## 2019-09-25 DIAGNOSIS — R2241 Localized swelling, mass and lump, right lower limb: Secondary | ICD-10-CM | POA: Diagnosis present

## 2019-09-25 DIAGNOSIS — Z79899 Other long term (current) drug therapy: Secondary | ICD-10-CM | POA: Diagnosis not present

## 2019-09-25 DIAGNOSIS — Z7982 Long term (current) use of aspirin: Secondary | ICD-10-CM | POA: Diagnosis not present

## 2019-09-25 DIAGNOSIS — R5381 Other malaise: Secondary | ICD-10-CM | POA: Diagnosis not present

## 2019-09-25 DIAGNOSIS — M10071 Idiopathic gout, right ankle and foot: Secondary | ICD-10-CM | POA: Diagnosis not present

## 2019-09-25 DIAGNOSIS — R000 Tachycardia, unspecified: Secondary | ICD-10-CM | POA: Diagnosis not present

## 2019-09-25 MED ORDER — KETOROLAC TROMETHAMINE 60 MG/2ML IM SOLN
60.0000 mg | Freq: Once | INTRAMUSCULAR | Status: AC
  Administered 2019-09-25: 06:00:00 60 mg via INTRAMUSCULAR
  Filled 2019-09-25: qty 2

## 2019-09-25 MED ORDER — PREDNISONE 50 MG PO TABS
60.0000 mg | ORAL_TABLET | Freq: Once | ORAL | Status: AC
  Administered 2019-09-25: 60 mg via ORAL
  Filled 2019-09-25: qty 1

## 2019-09-25 MED ORDER — HYDROCODONE-ACETAMINOPHEN 5-325 MG PO TABS: 1.0000 | ORAL_TABLET | ORAL | 0 refills | Status: DC | PRN

## 2019-09-25 MED ORDER — PREDNISONE 50 MG PO TABS: 50.0000 mg | ORAL_TABLET | Freq: Every day | ORAL | 0 refills | Status: DC

## 2019-09-25 NOTE — ED Provider Notes (Signed)
[facility] EMERGENCY DEPARTMENT Provider Note   CSN: [id number] Arrival date & time: 09/25/19  0450   History Chief Complaint  Patient presents with  . Gout    SIDH Wesley Brennan is a 59 y.o. male.  The history is provided by the patient.  Has history of hypertension, hyperlipidemia, gout and comes in complaining of pain, redness,'s swelling of his right great toe typical of his gout flareups.  He rates pain at 8/10.  He has been taking acetaminophen at home without relief.  Pain started about 1 week ago and it is getting worse.  He denies fever or chills.  He denies any trauma.  Past Medical History:  Diagnosis Date  . Gout   . Hypercholesteremia   . Hypertension    related to drug use which he has quit  . Substance abuse ([place])    alcohol, nicotine, h/o coacaine and marijuana    Patient Active Problem List   Diagnosis Date Noted  . Elevated ferritin 08/22/2019  . Elevated LFTs 05/07/2019  . Transaminitis 01/12/2019  . History of colonic polyps 09/09/2018  . Heel pain 04/24/2018  . Smoking greater than 30 pack years 04/24/2018  . Essential hypertension 01/06/2018  . Gout 01/06/2018  . Chest pain 01/06/2018  . Tobacco use 12/13/2016  . Left knee pain 12/13/2016  . Tubular adenoma of colon 07/08/2015  . Vision loss, bilateral 04/27/2014  . Allergic rhinitis 12/15/2013  . Hyperlipidemia LDL goal <100 06/26/2008  . Alcohol dependence ([place]) 06/26/2008  . NICOTINE ADDICTION 11/27/2007  . MENTAL RETARDATION 11/27/2007    Past Surgical History:  Procedure Laterality Date  . COLONOSCOPY N/A 07/02/2015   Procedure: COLONOSCOPY;  Surgeon: Danie Binder, MD;  Location: [facility];  Service: Endoscopy;  Laterality: N/A;  1:00 PM  . COLONOSCOPY WITH PROPOFOL N/A 03/27/2019   Procedure: COLONOSCOPY WITH PROPOFOL;  Surgeon: Danie Binder, MD;  two 2-4 mm polyps, one 12 mm polyp with clips placed, and tortuous left colon.  Pathology with tubular adenomas.  Next colonoscopy in  2023.   [place]   left elbow dislocation  . ELBOW FUSION Left 1979  . HERNIA REPAIR  [id number] approx   umbilical hernia  . POLYPECTOMY  03/27/2019   Procedure: POLYPECTOMY;  Surgeon: Danie Binder, MD;  Location: [facility];  Service: Endoscopy;;       Family History  Adopted: Yes  Problem Relation Age of Onset  . Cancer Mother 88       Bone   . Diabetes Mother   . Hypertension Sister   . Stroke Father 81  . Heart disease Father 68  . Diabetes Father   . Colon cancer Neg Hx     Social History   Tobacco Use  . Smoking status: Current Some Day Smoker    Packs/day: 0.10    Years: 40.00    Pack years: 4.00    Types: Cigarettes    Last attempt to quit: 05/05/2019    Years since quitting: 0.3  . Smokeless tobacco: Never Used  . Tobacco comment: states not smoking often   Substance Use Topics  . Alcohol use: Yes    Comment: 1 beer every other day  . Drug use: No    Home Medications Prior to Admission medications   Medication Sig Start Date End Date Taking? Authorizing Provider  allopurinol (ZYLOPRIM) 300 MG tablet Take 1 tablet (300 mg total) by mouth daily. 06/25/19   Fayrene Helper,  MD  amLODipine (NORVASC) 2.5 MG tablet Take 1 tablet (2.5 mg total) by mouth daily. 06/25/19   Fayrene Helper, MD  aspirin EC 81 MG tablet Take 81 mg by mouth daily.    [provider]  icosapent Ethyl (VASCEPA) 1 g capsule Take 2 capsules (2 g total) by mouth 2 (two) times daily. 09/03/19   Arnoldo Lenis, MD    Allergies    Patient has no known allergies.  Review of Systems   Review of Systems  All other systems reviewed and are negative.   Physical Exam Updated Vital Signs BP (!) 142/90 (BP Location: Right Arm)   Pulse 79   Temp 97.9 F (36.6 C) (Oral)   Resp 15   Ht 5\' 11"  (1.803 m)   Wt 71.2 kg   SpO2 99%   BMI 21.90 kg/m   Physical Exam Vitals and nursing note reviewed.   59 year old male, resting comfortably and in no acute  distress. Vital signs are significant for borderline elevated blood pressure. Oxygen saturation is 99%, which is normal. Head is normocephalic and atraumatic. PERRLA, EOMI. Oropharynx is clear. Neck is nontender and supple without adenopathy or JVD. Back is nontender and there is no CVA tenderness. Lungs are clear without rales, wheezes, or rhonchi. Chest is nontender. Heart has regular rate and rhythm without murmur. Abdomen is soft, flat, nontender without masses or hepatosplenomegaly and peristalsis is normoactive. Extremities: There is erythema, warmth, tenderness over the right first MTP joint.  Remainder of extremity exam is unremarkable. Skin is warm and dry without rash. Neurologic: Mental status is normal, cranial nerves are intact, there are no motor or sensory deficits.  ED Results / Procedures / Treatments    Procedures Procedures  Medications Ordered in ED Medications  predniSONE (DELTASONE) tablet 60 mg (has no administration in time range)  ketorolac (TORADOL) injection 60 mg (has no administration in time range)    ED Course  I have reviewed the triage vital signs and the nursing notes.  MDM Rules/Calculators/A&P Gout involving the right first MTP joint.  Old records are reviewed, and he does have prior ED visits for gout.  He is given a dose of prednisone and ketorolac and he is discharged with instructions to use over-the-counter NSAIDs, prescription given for prednisone.  He is also given a take-home pack of hydrocodone-acetaminophen.  Follow-up with PCP.  Final Clinical Impression(s) / ED Diagnoses Final diagnoses:  None    Rx / DC Orders ED Discharge Orders         Ordered    HYDROcodone-acetaminophen (NORCO) 5-325 MG tablet  Every 4 hours PRN     09/25/19 0515    predniSONE (DELTASONE) 50 MG tablet  Daily     09/25/19 [id number]           Delora Fuel, MD [id number] [phone]

## 2019-09-25 NOTE — ED Triage Notes (Signed)
Pt c/o right great toe pain x one week.

## 2019-09-25 NOTE — Discharge Instructions (Addendum)
Take ibuprofen or naproxen as needed for pain.   You may add acetaminophen as needed for additional pain relief.  Reserve the hydrocodone-acetaminophen for severe pain.

## 2019-09-26 MED FILL — Hydrocodone-Acetaminophen Tab 5-325 MG: ORAL | Qty: 6 | Status: AC

## 2019-10-01 ENCOUNTER — Inpatient Hospital Stay (HOSPITAL_COMMUNITY): Payer: Medicare HMO | Attending: Hematology | Admitting: Hematology

## 2019-10-01 ENCOUNTER — Inpatient Hospital Stay (HOSPITAL_COMMUNITY): Payer: Medicare HMO

## 2019-10-01 ENCOUNTER — Encounter (HOSPITAL_COMMUNITY): Payer: Self-pay | Admitting: Hematology

## 2019-10-01 ENCOUNTER — Other Ambulatory Visit: Payer: Self-pay

## 2019-10-01 VITALS — BP 131/94 | HR 83 | Temp 97.8°F | Resp 18 | Ht 71.0 in | Wt 164.0 lb

## 2019-10-01 DIAGNOSIS — I10 Essential (primary) hypertension: Secondary | ICD-10-CM | POA: Diagnosis not present

## 2019-10-01 DIAGNOSIS — F17210 Nicotine dependence, cigarettes, uncomplicated: Secondary | ICD-10-CM | POA: Insufficient documentation

## 2019-10-01 DIAGNOSIS — R7989 Other specified abnormal findings of blood chemistry: Secondary | ICD-10-CM | POA: Insufficient documentation

## 2019-10-01 DIAGNOSIS — K7030 Alcoholic cirrhosis of liver without ascites: Secondary | ICD-10-CM | POA: Insufficient documentation

## 2019-10-01 LAB — CBC WITH DIFFERENTIAL/PLATELET
Abs Immature Granulocytes: 0.06 10*3/uL (ref 0.00–0.07)
Basophils Absolute: 0 10*3/uL (ref 0.0–0.1)
Basophils Relative: 0 %
Eosinophils Absolute: 0.2 10*3/uL (ref 0.0–0.5)
Eosinophils Relative: 3 %
HCT: 37.4 % — ABNORMAL LOW (ref 39.0–52.0)
Hemoglobin: 12.3 g/dL — ABNORMAL LOW (ref 13.0–17.0)
Immature Granulocytes: 1 %
Lymphocytes Relative: 48 %
Lymphs Abs: 3.3 10*3/uL (ref 0.7–4.0)
MCH: 34.3 pg — ABNORMAL HIGH (ref 26.0–34.0)
MCHC: 32.9 g/dL (ref 30.0–36.0)
MCV: 104.2 fL — ABNORMAL HIGH (ref 80.0–100.0)
Monocytes Absolute: 0.6 10*3/uL (ref 0.1–1.0)
Monocytes Relative: 8 %
Neutro Abs: 2.9 10*3/uL (ref 1.7–7.7)
Neutrophils Relative %: 40 %
Platelets: 266 10*3/uL (ref 150–400)
RBC: 3.59 MIL/uL — ABNORMAL LOW (ref 4.22–5.81)
RDW: 12.9 % (ref 11.5–15.5)
WBC: 7.1 10*3/uL (ref 4.0–10.5)
nRBC: 0 % (ref 0.0–0.2)

## 2019-10-01 LAB — LACTATE DEHYDROGENASE: LDH: 130 U/L (ref 98–192)

## 2019-10-01 LAB — COMPREHENSIVE METABOLIC PANEL
ALT: 44 U/L (ref 0–44)
AST: 41 U/L (ref 15–41)
Albumin: 3.6 g/dL (ref 3.5–5.0)
Alkaline Phosphatase: 77 U/L (ref 38–126)
Anion gap: 9 (ref 5–15)
BUN: 20 mg/dL (ref 6–20)
CO2: 24 mmol/L (ref 22–32)
Calcium: 9.2 mg/dL (ref 8.9–10.3)
Chloride: 106 mmol/L (ref 98–111)
Creatinine, Ser: 0.76 mg/dL (ref 0.61–1.24)
GFR calc Af Amer: >60 mL/min (ref >60)
GFR calc non Af Amer: >60 mL/min (ref >60)
Glucose, Bld: 99 mg/dL (ref 70–99)
Potassium: 4.1 mmol/L (ref 3.5–5.1)
Sodium: 139 mmol/L (ref 135–145)
Total Bilirubin: 0.5 mg/dL (ref 0.3–1.2)
Total Protein: 6.9 g/dL (ref 6.5–8.1)

## 2019-10-01 LAB — VITAMIN B12: Vitamin B-12: 221 pg/mL (ref 180–914)

## 2019-10-01 LAB — IRON AND TIBC
Iron: 91 ug/dL (ref 45–182)
Saturation Ratios: 30 % (ref 17.9–39.5)
TIBC: 300 ug/dL (ref 250–450)
UIBC: 209 ug/dL

## 2019-10-01 LAB — SEDIMENTATION RATE: Sed Rate: 20 mm/hr — ABNORMAL HIGH (ref 0–16)

## 2019-10-01 LAB — HEPATITIS B SURFACE ANTIBODY,QUALITATIVE: Hep B S Ab: NONREACTIVE

## 2019-10-01 LAB — HEPATITIS B CORE ANTIBODY, IGM: Hep B C IgM: NONREACTIVE

## 2019-10-01 LAB — FERRITIN: Ferritin: 489 ng/mL — ABNORMAL HIGH (ref 24–336)

## 2019-10-01 LAB — HEPATITIS B SURFACE ANTIGEN: Hepatitis B Surface Ag: NONREACTIVE

## 2019-10-01 LAB — C-REACTIVE PROTEIN: CRP: 5 mg/dL — ABNORMAL HIGH (ref <1.0)

## 2019-10-01 LAB — VITAMIN D 25 HYDROXY (VIT D DEFICIENCY, FRACTURES): Vit D, 25-Hydroxy: 25.08 ng/mL — ABNORMAL LOW (ref 30–100)

## 2019-10-01 NOTE — Assessment & Plan Note (Addendum)
1.  Elevated ferritin levels: -Evaluated at the request of Dr. Moshe Cipro for elevated ferritin levels of 1530 on 05/08/2019.  Percent saturation was 73. -He has elevated AST and ALT from alcoholic liver disease. -Ferritin in August 2017 was 410.  Recent hep C antibody was negative. -No family history of hemochromatosis. -Patient drinks beer daily for the last 25 years.  He used to drink up to 4 cans of 40 ounce beer every day, now cut it down to one 40 ounce beer daily. -Ultrasound of the liver on 01/15/2019 did not show any focal lesions.  Normal parenchymal echogenicity. -We will repeat his ferritin and iron panel today.  We will send for hemochromatosis testing.  We will also check for hepatitis serology. -We will see him back in 2 to 3 weeks to discuss results.  2.  Macrocytosis: -His MCV is elevated anywhere between 102 and 104. -We will check for nutritional deficiencies including [id number] and folic acid.  This is most likely coming from liver disease.  3.  Elevated LFTs: -His labs in February showed AST in 300 range. -We will repeat his LFTs today.  Liver disease can also be contributing to his elevated ferritin.

## 2019-10-01 NOTE — Patient Instructions (Signed)
[facility] at [facility] Discharge Instructions  Follow up in 2-3 weeks   Thank you for choosing [facility] at [facility] to provide your oncology and hematology care.  To afford each patient quality time with our provider, please arrive at least 15 minutes before your scheduled appointment time.   If you have a lab appointment with the Cancer Center please come in thru the Main Entrance and check in at the main information desk.  You need to re-schedule your appointment should you arrive 10 or more minutes late.  We strive to give you quality time with our providers, and arriving late affects you and other patients whose appointments are after yours.  Also, if you no show three or more times for appointments you may be dismissed from the clinic at the providers discretion.     Again, thank you for choosing [facility].  Our hope is that these requests will decrease the amount of time that you wait before being seen by our physicians.       _____________________________________________________________  Should you have questions after your visit to  Cancer Center, please contact our office at [phone] between the hours of 8:00 a.m. and 4:30 p.m.  Voicemails left after 4:00 p.m. will not be returned until the following business day.  For prescription refill requests, have your pharmacy contact our office and allow 72 hours.    Due to Covid, you will need to wear a mask upon entering the hospital. If you do not have a mask, a mask will be given to you at the Main Entrance upon arrival. For doctor visits, patients may have 1 support person with them. For treatment visits, patients can not have anyone with them due to social distancing guidelines and our immunocompromised population.      

## 2019-10-01 NOTE — Progress Notes (Signed)
CONSULT NOTE  Patient Care Team: Fayrene Helper, MD as PCP - General Branch, Alphonse Guild, MD as PCP - Cardiology (Cardiology) Danie Binder, MD as Consulting Physician (Gastroenterology)  CHIEF COMPLAINTS/PURPOSE OF CONSULTATION: Elevated ferritin  HISTORY OF PRESENTING ILLNESS:  Wesley Brennan 59 y.o. male was sent here by his PCP for his elevated ferritin.  Patient had labs done a month ago which showed his ferritin levels 1530.  Patient also has elevated liver enzymes due to alcohol induced cirrhosis of the liver.  He is being followed by his PCP for his elevated liver enzymes which has been high on and off since 2014.  Patient reports he usually drinks four 40s a day.  He has recently cut it back to one 40 a day.  He reports he is not interested in rehab.  He will stop on his own.  Patient denies a history of hepatitis or RA.  Patient denies starting any new medications.  Patient reports he does cook and cast iron pans a couple times a week.  He also reports he takes a men's One-A-Day vitamin that has vitamin C and D in it.  Patient denies any iron supplementation.  Patient denies any raw seafood consumption.  He reports that he had a blood clot in his foot a couple years ago which he was on anticoagulation for 6 months.  He has not had any further blood clots since then.  Patient denies ever needing a blood transfusion.  Patient denies any recent chest pain on exertion, shortness of breath on minimal exertion, presyncopal episodes, or palpitations.  He has not noticed any recent bleeding such as epistasis, hematuria or hematochezia.  He has no prior history or diagnosis of cancer.  He denies any pica. He has a family history with a mother with bone cancer.  He had 2 paternal aunts and uncles with unknown type cancer.  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   MEDICAL HISTORY:  Past Medical History:  Diagnosis Date  . Gout   . Hypercholesteremia   . Hypertension     related to drug use which he has quit  . Substance abuse ([place])    alcohol, nicotine, h/o coacaine and marijuana    SURGICAL HISTORY: Past Surgical History:  Procedure Laterality Date  . COLONOSCOPY N/A 07/02/2015   Procedure: COLONOSCOPY;  Surgeon: Danie Binder, MD;  [facility];  Service: Endoscopy;  Laterality: N/A;  1:00 PM  . COLONOSCOPY WITH PROPOFOL N/A 03/27/2019   Procedure: COLONOSCOPY WITH PROPOFOL;  Surgeon: Danie Binder, MD;  two 2-4 mm polyps, one 12 mm polyp with clips placed, and tortuous left colon.  Pathology with tubular adenomas.  Next colonoscopy in 2023.   [facility]   left elbow dislocation  . ELBOW FUSION Left 1979  . HERNIA REPAIR  [id number] approx   umbilical hernia  . POLYPECTOMY  03/27/2019   Procedure: POLYPECTOMY;  Surgeon: Danie Binder, MD;  [facility];  Service: Endoscopy;;    SOCIAL HISTORY: Social History   Socioeconomic History  . Marital status: Single    Spouse name: Not on file  . Number of children: 0  . Years of education: 72  . Highest education level: 12th grade  Occupational History  . Occupation: disabled   Tobacco Use  . Smoking status: Current Some Day Smoker    Packs/day: 0.10    Years: 40.00    Pack years: 4.00  Types: Cigarettes    Last attempt to quit: 05/05/2019    Years since quitting: 0.4  . Smokeless tobacco: Never Used  . Tobacco comment: states not smoking often   Substance and Sexual Activity  . Alcohol use: Yes    Comment: 1 beer every other day  . Drug use: No  . Sexual activity: Yes    Birth control/protection: Condom  Other Topics Concern  . Not on file  Social History Narrative   Lives alone    Social Determinants of Health   Financial Resource Strain:   . Difficulty of Paying Living Expenses:   Food Insecurity:   . Worried About Charity fundraiser in the Last Year:   . Arboriculturist in the Last Year:   Transportation Needs:   . Film/video editor  (Medical):   Marland Kitchen Lack of Transportation (Non-Medical):   Physical Activity:   . Days of Exercise per Week:   . Minutes of Exercise per Session:   Stress:   . Feeling of Stress :   Social Connections:   . Frequency of Communication with Friends and Family:   . Frequency of Social Gatherings with Friends and Family:   . Attends Religious Services:   . Active Member of Clubs or Organizations:   . Attends Archivist Meetings:   Marland Kitchen Marital Status:   Intimate Partner Violence:   . Fear of Current or Ex-Partner:   . Emotionally Abused:   Marland Kitchen Physically Abused:   . Sexually Abused:     FAMILY HISTORY: Family History  Adopted: Yes  Problem Relation Age of Onset  . Cancer Mother 77       Bone   . Diabetes Mother   . Hypertension Sister   . Stroke Father 55  . Heart disease Father 51  . Diabetes Father   . Colon cancer Neg Hx     ALLERGIES:  has No Known Allergies.  MEDICATIONS:  Current Outpatient Medications  Medication Sig Dispense Refill  . allopurinol (ZYLOPRIM) 300 MG tablet Take 1 tablet (300 mg total) by mouth daily. 90 tablet 0  . amLODipine (NORVASC) 2.5 MG tablet Take 1 tablet (2.5 mg total) by mouth daily. 90 tablet 0  . aspirin EC 81 MG tablet Take 81 mg by mouth daily.    Marland Kitchen ezetimibe (ZETIA) 10 MG tablet Take 10 mg by mouth daily.    Marland Kitchen HYDROcodone-acetaminophen (NORCO) 5-325 MG tablet Take 1 tablet by mouth every 4 (four) hours as needed for moderate pain. (Patient not taking: Reported on 10/01/2019) 6 tablet 0  . icosapent Ethyl (VASCEPA) 1 g capsule Take 2 capsules (2 g total) by mouth 2 (two) times daily. 360 capsule 3   No current facility-administered medications for this visit.    REVIEW OF SYSTEMS:   Constitutional: Denies fevers, chills or abnormal night sweats Respiratory: Denies cough, dyspnea or wheezes Cardiovascular: Denies palpitation, chest discomfort or lower extremity swelling Gastrointestinal:  Denies nausea, heartburn or change in bowel  habits Skin: Denies abnormal skin rashes Lymphatics: Denies new lymphadenopathy or easy bruising Neurological:Denies numbness, tingling or new weaknesses Behavioral/Psych: Mood is stable, no new changes  All other systems were reviewed with the patient and are negative.  PHYSICAL EXAMINATION: ECOG PERFORMANCE STATUS: 0 - Asymptomatic  Vitals:   10/01/19 1302  BP: (!) 131/94  Pulse: 83  Resp: 18  Temp: 97.8 F (36.6 C)  SpO2: 96%   Filed Weights   10/01/19 1302  Weight: 164 lb (  74.4 kg)    GENERAL:alert, no distress and comfortable SKIN: skin color, texture, turgor are normal, no rashes or significant lesions NECK: supple, thyroid normal size, non-tender, without nodularity LYMPH:  no palpable lymphadenopathy in the cervical, axillary or inguinal LUNGS: clear to auscultation and percussion with normal breathing effort HEART: regular rate & rhythm and no murmurs and no lower extremity edema ABDOMEN:abdomen soft, non-tender and normal bowel sounds Musculoskeletal:no cyanosis of digits and no clubbing  PSYCH: alert & oriented x 3 with fluent speech NEURO: no focal motor/sensory deficits  LABORATORY DATA:  I have reviewed the data as listed Recent Results (from the past 2160 hour(s))  Hepatic function panel     Status: Abnormal   Collection Time: 08/26/19  7:27 AM  Result Value Ref Range   Total Protein 6.7 6.1 - 8.1 g/dL   Albumin 3.8 3.6 - 5.1 g/dL   Globulin 2.9 1.9 - 3.7 g/dL (calc)   AG Ratio 1.3 1.0 - 2.5 (calc)   Total Bilirubin 1.5 (H) 0.2 - 1.2 mg/dL   Bilirubin, Direct 0.6 (H) 0.0 - 0.2 mg/dL   Indirect Bilirubin 0.9 0.2 - 1.2 mg/dL (calc)   Alkaline phosphatase (APISO) 154 (H) 35 - 144 U/L   AST 366 (H) 10 - 35 U/L   ALT 170 (H) 9 - 46 U/L  Hemochromatosis DNA-PCR(c282y,h63d)     Status: None   Collection Time: 08/26/19  7:27 AM  Result Value Ref Range   DNA MUTATION ANALYSIS SEE NOTE     Comment: RESULT: NEGATIVE . Interpretation: DNA testing indicates  that this individual is negative for the C282Y and H63D pathogenic variants in the HFE gene. This negative result significantly reduces the likelihood of hereditary hemochromatosis (HH) in this individual. However, it does not rule out the presence of other pathogenic variants within the HFE gene or a diagnosis of HH. The risk of this individual to carry an HFE pathogenic variant other than those tested in this assay depends greatly on family and clinical history as well as ethnicity. This assay does not test for other primary or secondary iron overload disorders. . Laboratory results and submitted clinical information reviewed by Ellwood Dense, Ph.D., [facility], [place], [place]. Marland Kitchen DETAILED ASSAY INFORMATION: Hereditary hemochromatosis (HH) is an autosomal recessive disorder of iron metabolism that can result in iron overload and potential organ failure. It is one of the most common genetic disorders in individuals  of European-Caucasian ancestry, with an estimated carrier frequency of 10%. HH is caused by pathogenic variants in the HFE gene. Most individuals with HH (60-90%) are homozygous for the C282Y pathogenic variant. A smaller percentage of affected individuals are either compound heterozygous for the C282Y and H63D pathogenic variants (3%-8%), or homozygous for the H63D pathogenic variant (approximately 1%). . METHODOLOGY: This assay detects two pathogenic variants in the HFE gene, C282Y (NM [id number]: c.845G>A, p.Cys282Tyr) and H63D (NM [id number]: c.187C>G, p.His63Asp), that are commonly associated with HH. These variants are detected by multiplex-polymerase chain reaction (PCR) amplification, followed by restriction enzyme digestion and capillary electrophoresis. Marland Kitchen LIMITATIONS: This assay does not detect other pathogenic variants in the HFE gene that may be associated with HH. Although rare, false positive or false negative results may occur. All results should be inter preted  in the context of clinical findings, relevant history, and other laboratory data. . Health care providers, please contact your local [place]' genetic counselor or call [phone] for assistance with the interpretation of these results. . This test was developed and  its analytical performance characteristics have been determined by [facility]. It has not been cleared or approved by FDA. This assay has been validated pursuant to the CLIA regulations and is used for clinical purposes. . For more information, please refer to http://education.questdiagnostics.com/faq/hemochromatos (This link is being provided for informational/educational purposes only.)   PSA     Status: None   Collection Time: 08/26/19  7:30 AM  Result Value Ref Range   PSA 1.5 < OR = 4.0 ng/mL    Comment: The total PSA value from this assay system is  standardized against the WHO standard. The test  result will be approximately 20% lower when compared  to the equimolar-standardized total PSA (Beckman  Coulter). Comparison of serial PSA results should be  interpreted with this fact in mind. . This test was performed using the Siemens  chemiluminescent method. Values obtained from  different assay methods cannot be used interchangeably. PSA levels, regardless of value, should not be interpreted as absolute evidence of the presence or absence of disease.   Uric acid     Status: None   Collection Time: 08/26/19  7:30 AM  Result Value Ref Range   Uric Acid, Serum 5.0 4.0 - 8.0 mg/dL    Comment: Therapeutic target for gout patients: <6.0 mg/dL .   Lipid panel     Status: Abnormal   Collection Time: 08/26/19  7:30 AM  Result Value Ref Range   Cholesterol 367 (H) <200 mg/dL   HDL 21 (L) > OR = 40 mg/dL   Triglycerides 2,246 (H) <150 mg/dL    Comment: Verified by repeat analysis. Marland Kitchen . If a non-fasting specimen was collected,  consider repeat triglyceride testing on a fasting specimen if clinically indicated.  Yates Decamp et al. J. of Clin. Lipidol. [id number]. . . There is increased risk of pancreatitis when the  triglyceride concentration is very high  (> or = 500 mg/dL, especially if > or = 1000 mg/dL).  Yates Decamp et al. J. of Clin. Lipidol. [id number]. Marland Kitchen    LDL Cholesterol (Calc)  mg/dL (calc)    Comment: . LDL cholesterol not calculated. Triglyceride levels greater than 400 mg/dL invalidate calculated LDL results. . Reference range: <100 . Desirable range <100 mg/dL for primary prevention;   <70 mg/dL for patients with CHD or diabetic patients  with > or = 2 CHD risk factors. Marland Kitchen LDL-C is now calculated using the Martin-Hopkins  calculation, which is a validated novel method providing  better accuracy than the Friedewald equation in the  estimation of LDL-C.  Cresenciano Genre et al. Annamaria Helling. [id number]): 2061-2068  (http://education.QuestDiagnostics.com/faq/FAQ164)    Total CHOL/HDL Ratio 17.5 (H) <5.0 (calc)   Non-HDL Cholesterol (Calc) 346 (H) <130 mg/dL (calc)    Comment: Non-HDL level > or = 220 is very high and may indicate  genetic familial hypercholesterolemia (FH). Clinical  assessment and measurement of blood lipid levels  should be considered for all first-degree relatives  of patients with an FH diagnosis. . For patients with diabetes plus 1 major ASCVD risk  factor, treating to a non-HDL-C goal of <100 mg/dL  (LDL-C of <70 mg/dL) is considered a therapeutic  option.   COMPLETE METABOLIC PANEL WITH GFR     Status: Abnormal   Collection Time: 08/26/19  7:30 AM  Result Value Ref Range   Glucose, Bld 88 65 - 99 mg/dL    Comment: .            Fasting reference interval .  BUN 20 7 - 25 mg/dL   Creat 1.11 0.70 - 1.25 mg/dL    Comment: For patients >36 years of age, the reference limit for Creatinine is approximately 13% higher for people identified as African-American. .     GFR, Est Non African American 72 > OR = 60 mL/min/1.74m2   GFR, Est African American 83 > OR = 60 mL/min/1.75m2   BUN/Creatinine Ratio NOT APPLICABLE 6 - 22 (calc)   Sodium 134 (L) 135 - 146 mmol/L   Potassium 4.6 3.5 - 5.3 mmol/L   Chloride 100 98 - 110 mmol/L   CO2 24 20 - 32 mmol/L   Calcium 9.3 8.6 - 10.3 mg/dL   Total Protein 6.7 6.1 - 8.1 g/dL   Albumin 3.7 3.6 - 5.1 g/dL   Globulin 3.0 1.9 - 3.7 g/dL (calc)   AG Ratio 1.2 1.0 - 2.5 (calc)   Total Bilirubin 1.5 (H) 0.2 - 1.2 mg/dL   Alkaline phosphatase (APISO) 157 (H) 35 - 144 U/L   AST 366 (H) 10 - 35 U/L   ALT 173 (H) 9 - 46 U/L  CBC     Status: Abnormal   Collection Time: 08/26/19  7:30 AM  Result Value Ref Range   WBC 5.2 3.8 - 10.8 Thousand/uL   RBC 4.03 (L) 4.20 - 5.80 Million/uL   Hemoglobin 14.2 13.2 - 17.1 g/dL   HCT 40.4 38.5 - 50.0 %   MCV 100.2 (H) 80.0 - 100.0 fL   MCH 35.2 (H) 27.0 - 33.0 pg   MCHC 35.1 32.0 - 36.0 g/dL   RDW 11.8 11.0 - 15.0 %   Platelets 152 140 - 400 Thousand/uL   MPV 11.0 7.5 - 12.5 fL  VITAMIN D 25 Hydroxy (Vit-D Deficiency, Fractures)     Status: Abnormal   Collection Time: 08/26/19  7:30 AM  Result Value Ref Range   Vit D, 25-Hydroxy 26 (L) 30 - 100 ng/mL    Comment: Vitamin D Status         25-OH Vitamin D: . Deficiency:                    <20 ng/mL Insufficiency:             20 - 29 ng/mL Optimal:                 > or = 30 ng/mL . For 25-OH Vitamin D testing on patients on  D2-supplementation and patients for whom quantitation  of D2 and D3 fractions is required, the QuestAssureD(TM) 25-OH VIT D, (D2,D3), LC/MS/MS is recommended: order  code [phone] (patients >41yrs). See Note 1 . Note 1 . For additional information, please refer to  http://education.QuestDiagnostics.com/faq/FAQ199  (This link is being provided for informational/ educational purposes only.)   Sedimentation rate     Status: Abnormal   Collection Time: 10/01/19  2:37 PM  Result Value Ref Range   Sed  Rate 20 (H) 0 - 16 mm/hr    Comment: Performed at [facility], [address] [place], [place] [address]  C-reactive protein     Status: Abnormal   Collection Time: 10/01/19  2:37 PM  Result Value Ref Range   CRP 5.0 (H) <1.0 mg/dL    Comment: RESULTS CONFIRMED BY MANUAL DILUTION Performed at [facility], [address] [place], [place] [address]   Hepatitis B core antibody, IgM     Status: None   Collection Time: 10/01/19  2:37 PM  Result Value  Ref Range   Hep B C IgM NON REACTIVE NON REACTIVE    Comment: Performed at [facility] [address] [place], [place] [address]  Hepatitis B surface antibody     Status: None   Collection Time: 10/01/19  2:37 PM  Result Value Ref Range   Hep B S Ab NON REACTIVE NON REACTIVE    Comment: (NOTE) Inconsistent with immunity, less than 10 mIU/mL. Performed at [facility], [place] [address] [place], [place] [address]   VITAMIN D 25 Hydroxy (Vit-D Deficiency, Fractures)     Status: Abnormal   Collection Time: 10/01/19  2:37 PM  Result Value Ref Range   Vit D, 25-Hydroxy 25.08 (L) 30 - 100 ng/mL    Comment: (NOTE) Vitamin D deficiency has been defined by the Institute of Medicine  and an Endocrine Society practice guideline as a level of serum 25-OH  vitamin D less than 20 ng/mL (1,2). The Endocrine Society went on to  further define vitamin D insufficiency as a level between 21 and 29  ng/mL (2). 1. IOM (Institute of Medicine). 2010. Dietary reference intakes for  calcium and D. [place]: The [employer]. 2. Holick MF, Binkley [place], Bischoff-Ferrari HA, et al. Evaluation,  treatment, and prevention of vitamin D deficiency: an Endocrine  Society clinical practice guideline, JCEM. 2011 Jul; 96(7): 1911-30. Performed at [facility], [place] [address] [place], [place] [address]   Vitamin B12     Status: None   Collection Time: 10/01/19  2:37 PM  Result Value Ref Range   Vitamin B-12 221 180 - 914 pg/mL     Comment: (NOTE) This assay is not validated for testing neonatal or myeloproliferative syndrome specimens for Vitamin B12 levels. Performed at [facility], [address], [place], [place] [address]   Iron and TIBC     Status: None   Collection Time: 10/01/19  2:37 PM  Result Value Ref Range   Iron 91 45 - 182 ug/dL   TIBC 300 250 - 450 ug/dL   Saturation Ratios 30 17.9 - 39.5 %   UIBC 209 ug/dL    Comment: Performed at [facility], [address], [place], [place] [address]  Ferritin     Status: Abnormal   Collection Time: 10/01/19  2:37 PM  Result Value Ref Range   Ferritin 489 (H) 24 - 336 ng/mL    Comment: Performed at [facility], [address], [place], [place] [address]  Comprehensive metabolic panel     Status: None   Collection Time: 10/01/19  2:37 PM  Result Value Ref Range   Sodium 139 135 - 145 mmol/L   Potassium 4.1 3.5 - 5.1 mmol/L   Chloride 106 98 - 111 mmol/L   CO2 24 22 - 32 mmol/L   Glucose, Bld 99 70 - 99 mg/dL    Comment: Glucose reference range applies only to samples taken after fasting for at least 8 hours.   BUN 20 6 - 20 mg/dL   Creatinine, Ser 0.76 0.61 - 1.24 mg/dL   Calcium 9.2 8.9 - 10.3 mg/dL   Total Protein 6.9 6.5 - 8.1 g/dL   Albumin 3.6 3.5 - 5.0 g/dL   AST 41 15 - 41 U/L   ALT 44 0 - 44 U/L   Alkaline Phosphatase 77 38 - 126 U/L   Total Bilirubin 0.5 0.3 - 1.2 mg/dL   GFR calc non Af Amer >60 >60 mL/min   GFR  calc Af Amer >60 >60 mL/min   Anion gap 9 5 - 15    Comment: Performed at [facility], [address] [place], [place] [address]  CBC with Differential/Platelet     Status: Abnormal   Collection Time: 10/01/19  2:37 PM  Result Value Ref Range   WBC 7.1 4.0 - 10.5 K/uL   RBC 3.59 (L) 4.22 - 5.81 MIL/uL   Hemoglobin 12.3 (L) 13.0 - 17.0 g/dL   HCT 37.4 (L) 39.0 - 52.0 %   MCV 104.2 (H) 80.0 - 100.0 fL   MCH 34.3 (H) 26.0 - 34.0 pg   MCHC 32.9 30.0 - 36.0 g/dL   RDW 12.9 11.5 - 15.5 %   Platelets 266 150 - 400 K/uL   nRBC 0.0  0.0 - 0.2 %   Neutrophils Relative % 40 %   Neutro Abs 2.9 1.7 - 7.7 K/uL   Lymphocytes Relative 48 %   Lymphs Abs 3.3 0.7 - 4.0 K/uL   Monocytes Relative 8 %   Monocytes Absolute 0.6 0.1 - 1.0 K/uL   Eosinophils Relative 3 %   Eosinophils Absolute 0.2 0.0 - 0.5 K/uL   Basophils Relative 0 %   Basophils Absolute 0.0 0.0 - 0.1 K/uL   Immature Granulocytes 1 %   Abs Immature Granulocytes 0.06 0.00 - 0.07 K/uL    Comment: Performed at [facility], [address], [place], [place] [address]  Lactate dehydrogenase     Status: None   Collection Time: 10/01/19  2:37 PM  Result Value Ref Range   LDH 130 98 - 192 U/L    Comment: Performed at [facility], [address] [place], [place] [address]  Hepatitis B surface antigen     Status: None   Collection Time: 10/01/19  2:38 PM  Result Value Ref Range   Hepatitis B Surface Ag NON REACTIVE NON REACTIVE    Comment: Performed at [facility], [place] [address], [place], [place] [address]    RADIOGRAPHIC STUDIES: I have personally reviewed the radiological images as listed and agreed with the findings in the report. I have independently examined and elicited history from this patient.  I agree with HPI written by my nurse practitioner Wenda Low, FNP.  I have independently formulated my assessment and plan.  ASSESSMENT & PLAN:  Elevated ferritin 1.  Elevated ferritin levels: -Evaluated at the request of Dr. Moshe Cipro for elevated ferritin levels of 1530 on 05/08/2019.  Percent saturation was 73. -He has elevated AST and ALT from alcoholic liver disease. -Ferritin in August 2017 was 410.  Recent hep C antibody was negative. -No family history of hemochromatosis. -Patient drinks beer daily for the last 25 years.  He used to drink up to 4 cans of 40 ounce beer every day, now cut it down to one 40 ounce beer daily. -Ultrasound of the liver on 01/15/2019 did not show any focal lesions.  Normal parenchymal echogenicity. -We will repeat his  ferritin and iron panel today.  We will send for hemochromatosis testing.  We will also check for hepatitis serology. -We will see him back in 2 to 3 weeks to discuss results.  2.  Macrocytosis: -His MCV is elevated anywhere between 102 and 104. -We will check for nutritional deficiencies including [id number] and folic acid.  This is most likely coming from liver disease.  3.  Elevated LFTs: -His labs in February showed AST in 300 range. -We will repeat his LFTs today.  Liver disease can  also be contributing to his elevated ferritin.     All questions were answered. The patient knows to call the clinic with any problems, questions or concerns.      Derek Jack, MD 10/04/19 4:15 PM

## 2019-10-04 ENCOUNTER — Encounter (HOSPITAL_COMMUNITY): Payer: Self-pay | Admitting: Hematology

## 2019-10-06 LAB — HEMOCHROMATOSIS DNA-PCR(C282Y,H63D)

## 2019-10-07 LAB — METHYLMALONIC ACID, SERUM: Methylmalonic Acid, Quantitative: 152 nmol/L (ref 0–378)

## 2019-10-09 ENCOUNTER — Ambulatory Visit: Payer: Medicare HMO

## 2019-10-14 ENCOUNTER — Other Ambulatory Visit (HOSPITAL_COMMUNITY): Payer: Self-pay | Admitting: *Deleted

## 2019-10-14 DIAGNOSIS — R7989 Other specified abnormal findings of blood chemistry: Secondary | ICD-10-CM

## 2019-10-22 ENCOUNTER — Inpatient Hospital Stay (HOSPITAL_COMMUNITY): Payer: Medicare HMO | Attending: Hematology | Admitting: Nurse Practitioner

## 2019-11-04 ENCOUNTER — Encounter: Payer: Self-pay | Admitting: Cardiology

## 2019-11-04 ENCOUNTER — Telehealth (INDEPENDENT_AMBULATORY_CARE_PROVIDER_SITE_OTHER): Payer: Medicare HMO | Admitting: Cardiology

## 2019-11-04 ENCOUNTER — Telehealth: Payer: Self-pay | Admitting: Licensed Clinical Social Worker

## 2019-11-04 VITALS — Ht 71.0 in | Wt 154.0 lb

## 2019-11-04 DIAGNOSIS — E782 Mixed hyperlipidemia: Secondary | ICD-10-CM | POA: Diagnosis not present

## 2019-11-04 DIAGNOSIS — R0789 Other chest pain: Secondary | ICD-10-CM

## 2019-11-04 NOTE — Patient Instructions (Signed)
Medication Instructions:  Your physician recommends that you continue on your current medications as directed. Please refer to the Current Medication list given to you today.    Labwork: cmet Fasting lipid (dont eat or drink anything after midnight the night before you get this lab done)   Testing/Procedures: none  Follow-Up: Your physician wants you to follow-up in: 6 months. You will receive a reminder letter in the mail two months in advance. If you don't receive a letter, please call our office to schedule the follow-up appointment.    Any Other Special Instructions Will Be Listed Below (If Applicable).     If you need a refill on your cardiac medications before your next appointment, please call your pharmacy.

## 2019-11-04 NOTE — Telephone Encounter (Signed)
CSW referred to assist patient with obtaining a BP cuff and scale. CSW contacted patient to inform cuff and scale will be delivered to home. Patient grateful for support and assistance. CSW available as needed. Raquel Sarna, [place], [place]

## 2019-11-04 NOTE — Addendum Note (Signed)
Addended by: Debbora Lacrosse R on: 11/04/2019 10:18 AM   Modules accepted: Orders

## 2019-11-04 NOTE — Progress Notes (Signed)
Virtual Visit via Telephone Note   This visit type was conducted due to national recommendations for restrictions regarding the COVID-19 Pandemic (e.g. social distancing) in an effort to limit this patient's exposure and mitigate transmission in our community.  Due to his co-morbid illnesses, this patient is at least at moderate risk for complications without adequate follow up.  This format is felt to be most appropriate for this patient at this time.  The patient did not have access to video technology/had technical difficulties with video requiring transitioning to audio format only (telephone).  All issues noted in this document were discussed and addressed.  No physical exam could be performed with this format.  Please refer to the patient's chart for his  consent to telehealth for [facility].   The patient was identified using 2 identifiers.  Date:  11/04/2019   ID:  Wesley Brennan, DOB 03/23/1961, MRN [id number]  Patient Location: Home Provider Location: Office  PCP:  Fayrene Helper, MD  Cardiologist:  Carlyle Dolly, MD  Electrophysiologist:  None   Evaluation Performed:  Follow-Up Visit  Chief Complaint:  Follow up visit  History of Present Illness:    Wesley Brennan is a 59 y.o. male seen today for follow up of the following medical problems.    1. Chest pain - admit 12/2017 with atypical chest pain.  - described at that time as sharp pain left chest, can be up to 10/10. Can have some chills associated, no other symptoms. Can last up to 2-3 hours constantly. Can be positional, worst with laying on left side, better with laying on right side - very physically active, rides his bike up to 2 miles a day, had no exertional symptoms. Uses pushing mower without symptoms. - trops negative, EKG nonspecific changes - 12/2017 nuclear stress suggests inferior infarct though may be artifact, no ischemia. LVEF 34% - 12/2017 echo LVEF 50%, no WMAs   - denies any recent  symptoms   2. Abnormal LFTs - followed by GI, thought to be EtOH related - by 09/2019 labs LFTs had normalized.     3. Hyperlipidemia - elevated TGs 2200, collected 2/9 at 730AM. I have confimred with him this was a fasting sample - working to wean [facility], previously was using regularly.  03/2019 TC 233 HDL 45 TG 1500  - last visit started vascepa 2g bid. Avoided fenofibrate due to elevated LFTs at the time.  - continuing to drink though he reports lower amounts.      The patient does not have symptoms concerning for COVID-19 infection (fever, chills, cough, or new shortness of breath).    Past Medical History:  Diagnosis Date  . Gout   . Hypercholesteremia   . Hypertension    related to drug use which he has quit  . Substance abuse ([place])    alcohol, nicotine, h/o coacaine and marijuana   Past Surgical History:  Procedure Laterality Date  . COLONOSCOPY N/A 07/02/2015   Procedure: COLONOSCOPY;  Surgeon: Danie Binder, MD;  [facility];  Service: Endoscopy;  Laterality: N/A;  1:00 PM  . COLONOSCOPY WITH PROPOFOL N/A 03/27/2019   Procedure: COLONOSCOPY WITH PROPOFOL;  Surgeon: Danie Binder, MD;  two 2-4 mm polyps, one 12 mm polyp with clips placed, and tortuous left colon.  Pathology with tubular adenomas.  Next colonoscopy in 2023.   [place]   left elbow dislocation  . ELBOW FUSION Left 1979  . HERNIA REPAIR  [id number] approx   umbilical hernia  . POLYPECTOMY  03/27/2019   Procedure: POLYPECTOMY;  Surgeon: Danie Binder, MD;  [facility];  Service: Endoscopy;;     No outpatient medications have been marked as taking for the 11/04/19 encounter (Appointment) with Arnoldo Lenis, MD.     Allergies:   Patient has no known allergies.   Social History   Tobacco Use  . Smoking status: Current Some Day Smoker    Packs/day: 0.10    Years: 40.00    Pack years: 4.00    Types: Cigarettes    Last attempt to quit: 05/05/2019     Years since quitting: 0.5  . Smokeless tobacco: Never Used  . Tobacco comment: states not smoking often   Substance Use Topics  . Alcohol use: Yes    Comment: 1 beer every other day  . Drug use: No     Family Hx: The patient's family history includes Cancer (age of onset: 29) in his mother; Diabetes in his father and mother; Heart disease (age of onset: 55) in his father; Hypertension in his sister; Stroke (age of onset: 48) in his father. There is no history of Colon cancer. He was adopted.  ROS:   Please see the history of present illness.     All other systems reviewed and are negative.   Prior CV studies:   The following studies were reviewed today:    Labs/Other Tests and Data Reviewed:    EKG:  No ECG reviewed.  Recent Labs: 01/08/2019: TSH 1.19 10/01/2019: ALT 44; BUN 20; Creatinine, Ser 0.76; Hemoglobin 12.3; Platelets 266; Potassium 4.1; Sodium 139   Recent Lipid Panel Lab Results  Component Value Date/Time   CHOL 367 (H) 08/26/2019 07:30 AM   TRIG 2,246 (H) 08/26/2019 07:30 AM   HDL 21 (L) 08/26/2019 07:30 AM   CHOLHDL 17.5 (H) 08/26/2019 07:30 AM   LDLCALC  08/26/2019 07:30 AM     Comment:     . LDL cholesterol not calculated. Triglyceride levels greater than 400 mg/dL invalidate calculated LDL results. . Reference range: <100 . Desirable range <100 mg/dL for primary prevention;   <70 mg/dL for patients with CHD or diabetic patients  with > or = 2 CHD risk factors. Marland Kitchen LDL-C is now calculated using the Martin-Hopkins  calculation, which is a validated novel method providing  better accuracy than the Friedewald equation in the  estimation of LDL-C.  Cresenciano Genre et al. Annamaria Helling. [id number]): 2061-2068  (http://education.[employer].com/faq/FAQ164)     Wt Readings from Last 3 Encounters:  10/01/19 164 lb (74.4 kg)  09/25/19 157 lb (71.2 kg)  09/15/19 157 lb (71.2 kg)     Objective:    Vital Signs:   Today's Vitals   11/04/19 0930  Weight: 154  lb (69.9 kg)  Height: 5\' 11"  (1.803 m)   Body mass index is 21.48 kg/m. Normal affect. Normal speech pattern and tone. Comofortable, no apparent distress. No audible signs of sob or wheezing.   ASSESSMENT & PLAN:    1. Chest pain - atypical symptoms that have resolved. Probable artifact on nuclear stress test, no evidence of ischemia. Echo with normal LVEF and no WMAs. Tolerates high levels of exertion - no recurrent symptoms, continue to monitor.    2. Hyperlipidemia - likely related to heavy EtOH use he is weaning - severely elevated TGs, started on vascepa - repeat lipid panel. If LFTs remain normal could add fenofibrate.     COVID-19 Education: The  signs and symptoms of COVID-19 were discussed with the patient and how to seek care for testing (follow up with PCP or arrange E-visit).  The importance of social distancing was discussed today.  Time:   Today, I have spent 15 minutes with the patient with telehealth technology discussing the above problems.     Medication Adjustments/Labs and Tests Ordered: Current medicines are reviewed at length with the patient today.  Concerns regarding medicines are outlined above.   Tests Ordered: No orders of the defined types were placed in this encounter.   Medication Changes: No orders of the defined types were placed in this encounter.   Follow Up:  Either In Person or Virtual in 6 month(s)  Signed, Carlyle Dolly, MD  11/04/2019 8:55 AM    [place]

## 2019-12-12 DIAGNOSIS — H5210 Myopia, unspecified eye: Secondary | ICD-10-CM | POA: Diagnosis not present

## 2019-12-18 ENCOUNTER — Other Ambulatory Visit: Payer: Self-pay

## 2019-12-18 ENCOUNTER — Emergency Department (HOSPITAL_COMMUNITY)
Admission: EM | Admit: 2019-12-18 | Discharge: 2019-12-18 | Disposition: A | Discharge status: Home / Self Care | Payer: Medicare HMO | Attending: Emergency Medicine | Admitting: Emergency Medicine

## 2019-12-18 ENCOUNTER — Emergency Department (HOSPITAL_COMMUNITY): Payer: Medicare HMO

## 2019-12-18 ENCOUNTER — Encounter (HOSPITAL_COMMUNITY): Payer: Self-pay | Admitting: Emergency Medicine

## 2019-12-18 DIAGNOSIS — R079 Chest pain, unspecified: Secondary | ICD-10-CM | POA: Diagnosis not present

## 2019-12-18 DIAGNOSIS — Z5321 Procedure and treatment not carried out due to patient leaving prior to being seen by health care provider: Secondary | ICD-10-CM | POA: Diagnosis not present

## 2019-12-18 LAB — BASIC METABOLIC PANEL
Anion gap: 11 (ref 5–15)
BUN: 22 mg/dL (ref 8–23)
CO2: 29 mmol/L (ref 22–32)
Calcium: 9.3 mg/dL (ref 8.9–10.3)
Chloride: 99 mmol/L (ref 98–111)
Creatinine, Ser: 1.14 mg/dL (ref 0.61–1.24)
GFR calc Af Amer: >60 mL/min (ref >60)
GFR calc non Af Amer: >60 mL/min (ref >60)
Glucose, Bld: 98 mg/dL (ref 70–99)
Potassium: 4.2 mmol/L (ref 3.5–5.1)
Sodium: 139 mmol/L (ref 135–145)

## 2019-12-18 LAB — CBC
HCT: 43.5 % (ref 39.0–52.0)
Hemoglobin: 14.2 g/dL (ref 13.0–17.0)
MCH: 33.4 pg (ref 26.0–34.0)
MCHC: 32.6 g/dL (ref 30.0–36.0)
MCV: 102.4 fL — ABNORMAL HIGH (ref 80.0–100.0)
Platelets: 194 10*3/uL (ref 150–400)
RBC: 4.25 MIL/uL (ref 4.22–5.81)
RDW: 11.9 % (ref 11.5–15.5)
WBC: 4.6 10*3/uL (ref 4.0–10.5)
nRBC: 0 % (ref 0.0–0.2)

## 2019-12-18 LAB — TROPONIN I (HIGH SENSITIVITY): Troponin I (High Sensitivity): 11 ng/L

## 2019-12-18 MED ORDER — SODIUM CHLORIDE 0.9% FLUSH: 3.0000 mL | Freq: Once | INTRAVENOUS | Status: DC

## 2019-12-18 NOTE — ED Notes (Signed)
Pt left without being seen.

## 2019-12-18 NOTE — ED Triage Notes (Signed)
Pt c/o of left sided cp since this morning.

## 2020-01-15 ENCOUNTER — Ambulatory Visit: Payer: Medicare HMO | Admitting: Family Medicine

## 2020-02-10 ENCOUNTER — Ambulatory Visit: Payer: Medicare HMO | Admitting: Family Medicine

## 2020-02-18 ENCOUNTER — Ambulatory Visit: Payer: Medicare HMO | Admitting: Family Medicine

## 2020-02-19 NOTE — Progress Notes (Deleted)
Referring Provider: Fayrene Helper, MD Primary Care Physician:  Fayrene Helper, MD Primary GI Physician: Dr. Rayne Du chief complaint on file.   HPI:   Wesley Brennan is a 59 y.o. male presenting today for follow-up of elevated LFTs. history significant for HTN, hypercholesterolemia, colon polyps, alcohol and substance abuse including cocaine and marijuana. Colonoscopy on 03/27/2019 with two2-4 mm polyps, one12 mm polyp with clips placed,and tortuous left colon. Pathology with tubular adenomas. Next colonoscopy in 2023.   Slight elevation of AST first noted in 2014,returned to normal in 2016, then increased again in 2018 and had been steady increasing along with elevation of ALT first noted in 2019.  RUQ ultrasound in July 2020 within normal limits.  Labs completed in October 2020 with AST 91 (down from 289), ALT normal at 43 (down from 169), alk phos and total bilirubin normal.  Platelets normal. Hepatitis B negative with no immunity and hepatitis C negative.  ANA, AMA, ASMA, ceruloplasmin, alpha-1 antitrypsin all negative.  Immunoglobulins without significant findings. Ferritin elevated at 1530, iron elevated at 215, percent saturation elevated at 73%. Denied drug use since the 1980s although UDS positive for cocaine in 2016.   At his last office visit in February 2021, he reported decreasing alcohol to 16 ounce beer every other day, denied drug use, no Tylenol, no over-the-counter supplements.  Plans to recheck LFTs and hemochromatosis DNA.   Labs completed 08/26/2019: AST 366, ALT 170, alk phos 154, total bilirubin 1.5.  Hemochromatosis DNA negative for C282Y and H63D pathogenic variants.  Patient admitted to drinking 40 ounces of beer daily.  Denies abdominal pain, nausea, vomiting.  Explained the importance of stopping all alcohol due to concerns for developing alcoholic hepatitis.  Plans to repeat HFP in 1 week and refer to hematology for elevated ferritin.  Patient did not  complete labs I have ordered.  He did have labs completed with Dr. Delton Coombes 10/01/2019.  AST, ALT, alk phos, total bilirubin all within normal limits.  Today:  Past Medical History:  Diagnosis Date  . Gout   . Hypercholesteremia   . Hypertension    related to drug use which he has quit  . Substance abuse ([place])    alcohol, nicotine, h/o coacaine and marijuana    Past Surgical History:  Procedure Laterality Date  . COLONOSCOPY N/A 07/02/2015   Procedure: COLONOSCOPY;  Surgeon: Danie Binder, MD;  [facility];  Service: Endoscopy;  Laterality: N/A;  1:00 PM  . COLONOSCOPY WITH PROPOFOL N/A 03/27/2019   Procedure: COLONOSCOPY WITH PROPOFOL;  Surgeon: Danie Binder, MD;  two 2-4 mm polyps, one 12 mm polyp with clips placed, and tortuous left colon.  Pathology with tubular adenomas.  Next colonoscopy in 2023.   [place]   left elbow dislocation  . ELBOW FUSION Left 1979  . HERNIA REPAIR  [id number] approx   umbilical hernia  . POLYPECTOMY  03/27/2019   Procedure: POLYPECTOMY;  Surgeon: Danie Binder, MD;  [facility];  Service: Endoscopy;;    Current Outpatient Medications  Medication Sig Dispense Refill  . allopurinol (ZYLOPRIM) 300 MG tablet Take 1 tablet (300 mg total) by mouth daily. 90 tablet 0  . amLODipine (NORVASC) 2.5 MG tablet Take 1 tablet (2.5 mg total) by mouth daily. 90 tablet 0  . aspirin EC 81 MG tablet Take 81 mg by mouth daily.    Marland Kitchen ezetimibe (ZETIA) 10 MG tablet Take 10 mg  by mouth daily.    Marland Kitchen HYDROcodone-acetaminophen (NORCO) 5-325 MG tablet Take 1 tablet by mouth every 4 (four) hours as needed for moderate pain. 6 tablet 0  . icosapent Ethyl (VASCEPA) 1 g capsule Take 2 capsules (2 g total) by mouth 2 (two) times daily. 360 capsule 3   No current facility-administered medications for this visit.    Allergies as of 02/20/2020  . (No Known Allergies)    Family History  Adopted: Yes  Problem Relation Age of Onset  . Cancer  Mother 10       Bone   . Diabetes Mother   . Hypertension Sister   . Stroke Father 15  . Heart disease Father 57  . Diabetes Father   . Colon cancer Neg Hx     Social History   Socioeconomic History  . Marital status: Single    Spouse name: Not on file  . Number of children: 0  . Years of education: 22  . Highest education level: 12th grade  Occupational History  . Occupation: disabled   Tobacco Use  . Smoking status: Current Some Day Smoker    Packs/day: 0.10    Years: 40.00    Pack years: 4.00    Types: Cigarettes    Last attempt to quit: 05/05/2019    Years since quitting: 0.7  . Smokeless tobacco: Never Used  . Tobacco comment: states not smoking often   Vaping Use  . Vaping Use: Never used  Substance and Sexual Activity  . Alcohol use: Yes    Comment: 1 beer every other day  . Drug use: No  . Sexual activity: Yes    Birth control/protection: Condom  Other Topics Concern  . Not on file  Social History Narrative   Lives alone    Social Determinants of Health   Financial Resource Strain:   . Difficulty of Paying Living Expenses:   Food Insecurity:   . Worried About Charity fundraiser in the Last Year:   . Arboriculturist in the Last Year:   Transportation Needs:   . Film/video editor (Medical):   Marland Kitchen Lack of Transportation (Non-Medical):   Physical Activity:   . Days of Exercise per Week:   . Minutes of Exercise per Session:   Stress:   . Feeling of Stress :   Social Connections:   . Frequency of Communication with Friends and Family:   . Frequency of Social Gatherings with Friends and Family:   . Attends Religious Services:   . Active Member of Clubs or Organizations:   . Attends Archivist Meetings:   Marland Kitchen Marital Status:     Review of Systems: Gen: Denies fever, chills, anorexia. Denies fatigue, weakness, weight loss.  CV: Denies chest pain, palpitations, syncope, peripheral edema, and claudication. Resp: Denies dyspnea at rest,  cough, wheezing, coughing up blood, and pleurisy. GI: Denies vomiting blood, jaundice, and fecal incontinence.   Denies dysphagia or odynophagia. Derm: Denies rash, itching, dry skin Psych: Denies depression, anxiety, memory loss, confusion. No homicidal or suicidal ideation.  Heme: Denies bruising, bleeding, and enlarged lymph nodes.  Physical Exam: There were no vitals taken for this visit. General:   Alert and oriented. No distress noted. Pleasant and cooperative.  Head:  Normocephalic and atraumatic. Eyes:  Conjuctiva clear without scleral icterus. Mouth:  Oral mucosa pink and moist. Good dentition. No lesions. Heart:  S1, S2 present without murmurs appreciated. Lungs:  Clear to auscultation bilaterally. No wheezes,  rales, or rhonchi. No distress.  Abdomen:  +BS, soft, non-tender and non-distended. No rebound or guarding. No HSM or masses noted. Msk:  Symmetrical without gross deformities. Normal posture. Extremities:  Without edema. Neurologic:  Alert and  oriented x4 Psych:  Alert and cooperative. Normal mood and affect.

## 2020-02-20 ENCOUNTER — Ambulatory Visit: Payer: Medicare HMO | Admitting: Gastroenterology

## 2020-02-20 ENCOUNTER — Encounter: Payer: Self-pay | Admitting: *Deleted

## 2020-03-03 ENCOUNTER — Ambulatory Visit: Payer: Medicare HMO | Admitting: Family Medicine

## 2020-04-12 ENCOUNTER — Other Ambulatory Visit: Payer: Self-pay

## 2020-04-12 ENCOUNTER — Encounter (HOSPITAL_COMMUNITY): Payer: Self-pay | Admitting: Emergency Medicine

## 2020-04-12 ENCOUNTER — Emergency Department (HOSPITAL_COMMUNITY)
Admission: EM | Admit: 2020-04-12 | Discharge: 2020-04-13 | Disposition: A | Discharge status: Home / Self Care | Payer: Medicare HMO | Attending: Emergency Medicine | Admitting: Emergency Medicine

## 2020-04-12 DIAGNOSIS — Z79899 Other long term (current) drug therapy: Secondary | ICD-10-CM | POA: Insufficient documentation

## 2020-04-12 DIAGNOSIS — F10121 Alcohol abuse with intoxication delirium: Secondary | ICD-10-CM | POA: Insufficient documentation

## 2020-04-12 DIAGNOSIS — F10921 Alcohol use, unspecified with intoxication delirium: Secondary | ICD-10-CM

## 2020-04-12 DIAGNOSIS — R404 Transient alteration of awareness: Secondary | ICD-10-CM | POA: Diagnosis not present

## 2020-04-12 DIAGNOSIS — Z7982 Long term (current) use of aspirin: Secondary | ICD-10-CM | POA: Diagnosis not present

## 2020-04-12 DIAGNOSIS — R410 Disorientation, unspecified: Secondary | ICD-10-CM | POA: Diagnosis present

## 2020-04-12 DIAGNOSIS — F17210 Nicotine dependence, cigarettes, uncomplicated: Secondary | ICD-10-CM | POA: Diagnosis not present

## 2020-04-12 DIAGNOSIS — Y908 Blood alcohol level of 240 mg/100 ml or more: Secondary | ICD-10-CM | POA: Insufficient documentation

## 2020-04-12 DIAGNOSIS — I10 Essential (primary) hypertension: Secondary | ICD-10-CM | POA: Insufficient documentation

## 2020-04-12 DIAGNOSIS — F10929 Alcohol use, unspecified with intoxication, unspecified: Secondary | ICD-10-CM | POA: Diagnosis not present

## 2020-04-12 DIAGNOSIS — W19XXXA Unspecified fall, initial encounter: Secondary | ICD-10-CM | POA: Diagnosis not present

## 2020-04-12 LAB — ETHANOL: Alcohol, Ethyl (B): 304 mg/dL (ref <10)

## 2020-04-12 MED ORDER — HALOPERIDOL LACTATE 5 MG/ML IJ SOLN
5.0000 mg | Freq: Once | INTRAMUSCULAR | Status: AC
  Administered 2020-04-12: 5 mg via INTRAVENOUS
  Filled 2020-04-12: qty 1

## 2020-04-12 MED ORDER — DROPERIDOL 2.5 MG/ML IJ SOLN: 2.5000 mg | Freq: Once | INTRAMUSCULAR | Status: DC

## 2020-04-12 NOTE — ED Notes (Signed)
CRITICAL VALUE ALERT  Critical Value:  304 ETOH  Date & Time Notied:  04/12/2020 2301  Provider Notified: Dr. Vanita Panda  Orders Received/Actions taken: see chart

## 2020-04-12 NOTE — ED Triage Notes (Signed)
[facility] - pt found down behind a dumpster. VSS. 18 R AC. ETOH on board

## 2020-04-12 NOTE — ED Provider Notes (Signed)
[facility] EMERGENCY DEPARTMENT Provider Note   CSN: [id number] Arrival date & time: 04/12/20  2036     History Chief Complaint  Patient presents with   Alcohol Intoxication    Wesley Brennan is a 59 y.o. male.  HPI    Patient presents after being found by bystanders next to a dumpster. Patient himself denies pain, is awake, acknowledges drinking alcohol, states that he is drunk. He arrives via EMS, who notes no substantial abnormalities, no hemodynamic instability. Patient denies drug use, as above acknowledges alcohol, denies other complaints.  Level 5 caveat secondary to cognitive impairment (presumbably due to alcohol.)   Past Medical History:  Diagnosis Date   Gout    Hypercholesteremia    Hypertension    related to drug use which he has quit   Substance abuse ([place])    alcohol, nicotine, h/o coacaine and marijuana    Patient Active Problem List   Diagnosis Date Noted   Elevated ferritin 08/22/2019   Elevated LFTs 05/07/2019   Transaminitis 01/12/2019   History of colonic polyps 09/09/2018   Heel pain 04/24/2018   Smoking greater than 30 pack years 04/24/2018   Essential hypertension 01/06/2018   Gout 01/06/2018   Chest pain 01/06/2018   Tobacco use 12/13/2016   Left knee pain 12/13/2016   Tubular adenoma of colon 07/08/2015   Vision loss, bilateral 04/27/2014   Allergic rhinitis 12/15/2013   Hyperlipidemia LDL goal <100 06/26/2008   Alcohol dependence ([place]) 06/26/2008   NICOTINE ADDICTION 11/27/2007   MENTAL RETARDATION 11/27/2007    Past Surgical History:  Procedure Laterality Date   COLONOSCOPY N/A 07/02/2015   Procedure: COLONOSCOPY;  Surgeon: Danie Binder, MD;  [facility];  Service: Endoscopy;  Laterality: N/A;  1:00 PM   COLONOSCOPY WITH PROPOFOL N/A 03/27/2019   Procedure: COLONOSCOPY WITH PROPOFOL;  Surgeon: Danie Binder, MD;  two 2-4 mm polyps, one 12 mm polyp with clips placed, and tortuous left  colon.  Pathology with tubular adenomas.  Next colonoscopy in 2023.    ELBOW FUSION  1993   left elbow dislocation   ELBOW FUSION Left 1979   HERNIA REPAIR  [id number] approx   umbilical hernia   POLYPECTOMY  03/27/2019   Procedure: POLYPECTOMY;  Surgeon: Danie Binder, MD;  [facility];  Service: Endoscopy;;       Family History  Adopted: Yes  Problem Relation Age of Onset   Cancer Mother 67       Bone    Diabetes Mother    Hypertension Sister    Stroke Father 74   Heart disease Father 21   Diabetes Father    Colon cancer Neg Hx     Social History   Tobacco Use   Smoking status: Current Some Day Smoker    Packs/day: 0.10    Years: 40.00    Pack years: 4.00    Types: Cigarettes    Last attempt to quit: 05/05/2019    Years since quitting: 0.9   Smokeless tobacco: Never Used   Tobacco comment: states not smoking often   Vaping Use   Vaping Use: Never used  Substance Use Topics   Alcohol use: Yes    Comment: 1 beer every other day   Drug use: No    Home Medications Prior to Admission medications   Medication Sig Start Date End Date Taking? Authorizing Provider  allopurinol (ZYLOPRIM) 300 MG tablet Take 1 tablet (300 mg total) by mouth daily.  06/25/19   Fayrene Helper, MD  amLODipine (NORVASC) 2.5 MG tablet Take 1 tablet (2.5 mg total) by mouth daily. 06/25/19   Fayrene Helper, MD  aspirin EC 81 MG tablet Take 81 mg by mouth daily.    [provider]  ezetimibe (ZETIA) 10 MG tablet Take 10 mg by mouth daily. 06/25/19   [provider]  HYDROcodone-acetaminophen (NORCO) 5-325 MG tablet Take 1 tablet by mouth every 4 (four) hours as needed for moderate pain. 5/63/89   Delora Fuel, MD  icosapent Ethyl (VASCEPA) 1 g capsule Take 2 capsules (2 g total) by mouth 2 (two) times daily. 09/03/19   Arnoldo Lenis, MD    Allergies    Patient has no known allergies.  Review of Systems   Review of Systems  Unable to  perform ROS: Other  Clinically intoxicated  Physical Exam Updated Vital Signs BP 119/80    Pulse 82    Temp 97.7 F (36.5 C) (Axillary)    Resp 16    Ht 5\' 11"  (1.803 m)    Wt 74.4 kg    SpO2 96%    BMI 22.88 kg/m   Physical Exam Vitals and nursing note reviewed.  Constitutional:      Comments: Thin adult male resting comfortably, speaking very quietly, but understandably.  HENT:     Head: Atraumatic.     Comments: Edentulous Cardiovascular:     Rate and Rhythm: Normal rate and regular rhythm.  Pulmonary:     Effort: No respiratory distress.  Abdominal:     General: There is no distension.     Tenderness: There is no abdominal tenderness. There is no guarding.  Musculoskeletal:        General: No deformity.  Neurological:     General: No focal deficit present.     Mental Status: He is oriented to person, place, and time.     Motor: Atrophy present.  Psychiatric:        Behavior: Behavior is slowed and withdrawn.     ED Results / Procedures / Treatments    Procedures Procedures (including critical care time)  Medications Ordered in ED Medications - No data to display  ED Course  I have reviewed the triage vital signs and the nursing notes.  Pertinent labs & imaging results that were available during my care of the patient were reviewed by me and considered in my medical decision making (see chart for details).   This adult male presents after being found by bystanders next to a dumpster. Patient self acknowledges alcohol use, denies drug use. He is withdrawn, sleepy, but has a patent airway, has no complaints of physical pain, has no notable physical deformities on exam, and presentation most consistent with acute alcohol intoxication.  Patient will require additional monitoring to return to sobriety.    11:01 PM Patient has been uncooperative, climbing from his gurney, verbal, disagreeable, patient has received sedative.     Dr. Tomi Bamberger is aware of the  patient.   Final Clinical Impression(s) / ED Diagnoses Final diagnoses:  Acute alcoholic intoxication with delirium [facility])     [name], MD 04/12/20 2302

## 2020-04-13 NOTE — ED Provider Notes (Signed)
Patient was left at change of shift after presenting to the emergency department when he was found next to the dumpster.  Patient's alcohol is over 300.  I have been observing the patient several times as I walk around the emergency department.  He was awake for a little while around midnight but then he slept most of the night.  5:00 AM nurses report when patient went to the bathroom he was still very unsteady and appeared to still be intoxicated.  7:00 AM Dr. Dorie Rank was made aware of patient and that he could be discharged once he is sober enough to be safe to go home.  Rolland Porter, MD, Barbette Or, MD 04/13/20 [phone]

## 2020-04-13 NOTE — Discharge Instructions (Addendum)
Avoid drinking alcohol in excess.   Follow up with your primary care doctor

## 2020-04-13 NOTE — ED Provider Notes (Signed)
Patient is awake.  He would like to have something for breakfast.  Will give pt breakfast then anticipate discharge   Dorie Rank, MD 04/13/20 [phone]

## 2020-04-15 ENCOUNTER — Other Ambulatory Visit: Payer: Self-pay

## 2020-04-15 DIAGNOSIS — M79672 Pain in left foot: Secondary | ICD-10-CM | POA: Insufficient documentation

## 2020-04-15 DIAGNOSIS — Z5321 Procedure and treatment not carried out due to patient leaving prior to being seen by health care provider: Secondary | ICD-10-CM | POA: Diagnosis not present

## 2020-04-15 NOTE — ED Triage Notes (Signed)
Pt. States they got a piece of glass in their left foot. Pt. States they stepped on the broken glass in their apartment.  Pt. Denies any bleeding.

## 2020-04-16 ENCOUNTER — Emergency Department (HOSPITAL_COMMUNITY): Payer: Medicare HMO

## 2020-04-16 ENCOUNTER — Emergency Department (HOSPITAL_COMMUNITY)
Admission: EM | Admit: 2020-04-16 | Discharge: 2020-04-16 | Disposition: A | Discharge status: Home / Self Care | Payer: Medicare HMO | Attending: Emergency Medicine | Admitting: Emergency Medicine

## 2020-04-30 ENCOUNTER — Ambulatory Visit: Payer: Medicare HMO | Admitting: Cardiology

## 2020-05-06 ENCOUNTER — Emergency Department (HOSPITAL_COMMUNITY)
Admission: EM | Admit: 2020-05-06 | Discharge: 2020-05-06 | Disposition: A | Discharge status: Home / Self Care | Payer: Medicare HMO | Attending: Emergency Medicine | Admitting: Emergency Medicine

## 2020-05-06 ENCOUNTER — Emergency Department (HOSPITAL_COMMUNITY): Payer: Medicare HMO

## 2020-05-06 ENCOUNTER — Encounter (HOSPITAL_COMMUNITY): Payer: Self-pay | Admitting: *Deleted

## 2020-05-06 ENCOUNTER — Other Ambulatory Visit: Payer: Self-pay

## 2020-05-06 DIAGNOSIS — T148XXA Other injury of unspecified body region, initial encounter: Secondary | ICD-10-CM

## 2020-05-06 DIAGNOSIS — S99922A Unspecified injury of left foot, initial encounter: Secondary | ICD-10-CM | POA: Diagnosis not present

## 2020-05-06 DIAGNOSIS — F17210 Nicotine dependence, cigarettes, uncomplicated: Secondary | ICD-10-CM | POA: Insufficient documentation

## 2020-05-06 DIAGNOSIS — Z79899 Other long term (current) drug therapy: Secondary | ICD-10-CM | POA: Diagnosis not present

## 2020-05-06 DIAGNOSIS — S90852A Superficial foreign body, left foot, initial encounter: Secondary | ICD-10-CM | POA: Diagnosis not present

## 2020-05-06 DIAGNOSIS — I10 Essential (primary) hypertension: Secondary | ICD-10-CM | POA: Insufficient documentation

## 2020-05-06 DIAGNOSIS — Z1881 Retained glass fragments: Secondary | ICD-10-CM

## 2020-05-06 DIAGNOSIS — Z7982 Long term (current) use of aspirin: Secondary | ICD-10-CM | POA: Insufficient documentation

## 2020-05-06 DIAGNOSIS — M7732 Calcaneal spur, left foot: Secondary | ICD-10-CM | POA: Diagnosis not present

## 2020-05-06 DIAGNOSIS — M795 Residual foreign body in soft tissue: Secondary | ICD-10-CM | POA: Insufficient documentation

## 2020-05-06 NOTE — Discharge Instructions (Signed)
Please keep the area clean and dry. Return to the ER for any worsening signs of infection which includes worsening redness, swelling, pain, pus discharge, etc.

## 2020-05-06 NOTE — ED Provider Notes (Signed)
[facility] Provider Note   CSN: [id number] Arrival date & time: 05/06/20  1308     History Chief Complaint  Patient presents with  . Foreign Body in [place] [facility] is a 59 y.o. male.  HPI 59 year old male with a history of gout, hypertension, hypercholesteremia, substance abuse presents to the ER with complaints of glass in his foot.  States he stepped on some broken glass proximately 10 days ago.  Has been having a small red area around the area on the bottom of his foot where the glass was.  He is requesting that it be taken out.  Denies any fevers or chills.  No numbness or tingling.  He also notes a blister on the inside of his toe and has asked me to look at it.    Past Medical History:  Diagnosis Date  . Gout   . Hypercholesteremia   . Hypertension    related to drug use which he has quit  . Substance abuse ([place])    alcohol, nicotine, h/o coacaine and marijuana    Patient Active Problem List   Diagnosis Date Noted  . Elevated ferritin 08/22/2019  . Elevated LFTs 05/07/2019  . Transaminitis 01/12/2019  . History of colonic polyps 09/09/2018  . Heel pain 04/24/2018  . Smoking greater than 30 pack years 04/24/2018  . Essential hypertension 01/06/2018  . Gout 01/06/2018  . Chest pain 01/06/2018  . Tobacco use 12/13/2016  . Left knee pain 12/13/2016  . Tubular adenoma of colon 07/08/2015  . Vision loss, bilateral 04/27/2014  . Allergic rhinitis 12/15/2013  . Hyperlipidemia LDL goal <100 06/26/2008  . Alcohol dependence ([place]) 06/26/2008  . NICOTINE ADDICTION 11/27/2007  . MENTAL RETARDATION 11/27/2007    Past Surgical History:  Procedure Laterality Date  . COLONOSCOPY N/A 07/02/2015   Procedure: COLONOSCOPY;  Surgeon: Danie Binder, MD;  [facility];  Service: Endoscopy;  Laterality: N/A;  1:00 PM  . COLONOSCOPY WITH PROPOFOL N/A 03/27/2019   Procedure: COLONOSCOPY WITH PROPOFOL;  Surgeon: Danie Binder, MD;  two  2-4 mm polyps, one 12 mm polyp with clips placed, and tortuous left colon.  Pathology with tubular adenomas.  Next colonoscopy in 2023.   [place]   left elbow dislocation  . ELBOW FUSION Left 1979  . HERNIA REPAIR  9211 approx   umbilical hernia  . POLYPECTOMY  03/27/2019   Procedure: POLYPECTOMY;  Surgeon: Danie Binder, MD;  Location: [facility];  Service: Endoscopy;;       Family History  Adopted: Yes  Problem Relation Age of Onset  . Cancer Mother 68       Bone   . Diabetes Mother   . Hypertension Sister   . Stroke Father 68  . Heart disease Father 61  . Diabetes Father   . Colon cancer Neg Hx     Social History   Tobacco Use  . Smoking status: Current Some Day Smoker    Packs/day: 0.10    Years: 40.00    Pack years: 4.00    Types: Cigarettes    Last attempt to quit: 05/05/2019    Years since quitting: 1.0  . Smokeless tobacco: Never Used  . Tobacco comment: states not smoking often   Vaping Use  . Vaping Use: Never used  Substance Use Topics  . Alcohol use: Yes    Comment: 1 beer every other day  . Drug use: No  Home Medications Prior to Admission medications   Medication Sig Start Date End Date Taking? Authorizing Provider  allopurinol (ZYLOPRIM) 300 MG tablet Take 1 tablet (300 mg total) by mouth daily. 06/25/19   Fayrene Helper, MD  amLODipine (NORVASC) 2.5 MG tablet Take 1 tablet (2.5 mg total) by mouth daily. 06/25/19   Fayrene Helper, MD  aspirin EC 81 MG tablet Take 81 mg by mouth daily.    [provider]  ezetimibe (ZETIA) 10 MG tablet Take 10 mg by mouth daily. 06/25/19   [provider]  HYDROcodone-acetaminophen (NORCO) 5-325 MG tablet Take 1 tablet by mouth every 4 (four) hours as needed for moderate pain. 0/17/49   Delora Fuel, MD  icosapent Ethyl (VASCEPA) 1 g capsule Take 2 capsules (2 g total) by mouth 2 (two) times daily. 09/03/19   Arnoldo Lenis, MD    Allergies    Patient has no known  allergies.  Review of Systems   Review of Systems  Constitutional: Negative for chills and fever.  Skin: Positive for color change and wound. Negative for pallor.  Neurological: Negative for weakness and numbness.    Physical Exam Updated Vital Signs BP (!) 144/97 (BP Location: Right Arm)   Pulse 74   Temp 98.8 F (37.1 C) (Oral)   Resp 16   Ht 5\' 11"  (1.803 m)   Wt 76.7 kg   SpO2 92%   BMI 23.57 kg/m   Physical Exam Vitals reviewed.  Constitutional:      Appearance: Normal appearance.  HENT:     Head: Normocephalic and atraumatic.  Eyes:     General:        Right eye: No discharge.        Left eye: No discharge.     Extraocular Movements: Extraocular movements intact.     Conjunctiva/sclera: Conjunctivae normal.  Musculoskeletal:        General: No swelling. Normal range of motion.  Skin:    Comments: Small erythematous area the size of a dime with evidence of a small piece of glass protruding from the left pad of his foot.  No excessive erythema, fluctuance, drainage.  Full range of motion of toes.  No evidence of gangrene.  He also has a blister on the medial inner aspect of his third toe.  No evidence of erythema, drainage, swelling, gangrene.  Neurological:     General: No focal deficit present.     Mental Status: He is alert and oriented to person, place, and time.  Psychiatric:        Mood and Affect: Mood normal.        Behavior: Behavior normal.     ED Results / Procedures / Treatments   Labs (all labs ordered are listed, but only abnormal results are displayed) Labs Reviewed - No data to display  EKG None  Radiology No results found.  Procedures Procedures (including critical care time)  Medications Ordered in ED Medications - No data to display  ED Course  I have reviewed the triage vital signs and the nursing notes.  Pertinent labs & imaging results that were available during my care of the patient were reviewed by me and considered in my  medical decision making (see chart for details).    MDM Rules/Calculators/A&P                          59 year old male requesting a small piece of glass removed from  his foot.  No overt signs of infection, abscess, gangrene.  I was able to make a small incision in the ball of his foot and remove the small piece of glass.  Patient also noted to blister to the medial third toe on his left foot.  No signs of infection to the toe.  Patient does not have any history of diabetes.  Instructed the patient to wrap his toe and protected from repeated rubbing.  Instructed to keep the area with the removed glass clean and dry.  Return precautions discussed.  He voiced understanding and is agreeable.  At this stage in the ED course, the patient is medically screened and stable for discharge. Final Clinical Impression(s) / ED Diagnoses Final diagnoses:  Glass foreign body in skin    Rx / DC Orders ED Discharge Orders    None       Garald Balding, PA-C 05/06/20 1400    Dorie Rank, MD 05/06/20 [phone]

## 2020-05-06 NOTE — ED Triage Notes (Signed)
Pt with FB in left foot, states he has a piece of glass that has been there for 2-3 days.  Pt asks immediately when arrived in triage about "how long is it gonna take" and that he has to go to work.

## 2020-05-31 ENCOUNTER — Encounter: Payer: Self-pay | Admitting: Nurse Practitioner

## 2020-05-31 ENCOUNTER — Ambulatory Visit (INDEPENDENT_AMBULATORY_CARE_PROVIDER_SITE_OTHER): Payer: Medicare HMO

## 2020-05-31 ENCOUNTER — Other Ambulatory Visit: Payer: Self-pay

## 2020-05-31 ENCOUNTER — Ambulatory Visit (INDEPENDENT_AMBULATORY_CARE_PROVIDER_SITE_OTHER): Payer: Medicare HMO | Admitting: Nurse Practitioner

## 2020-05-31 VITALS — BP 139/78 | HR 88 | Temp 98.8°F | Resp 20 | Ht 71.0 in | Wt 156.0 lb

## 2020-05-31 DIAGNOSIS — L989 Disorder of the skin and subcutaneous tissue, unspecified: Secondary | ICD-10-CM | POA: Diagnosis not present

## 2020-05-31 DIAGNOSIS — B353 Tinea pedis: Secondary | ICD-10-CM

## 2020-05-31 DIAGNOSIS — Z23 Encounter for immunization: Secondary | ICD-10-CM

## 2020-05-31 MED ORDER — TOLNAFTATE 1 % EX POWD: Freq: Two times a day (BID) | CUTANEOUS | Status: AC

## 2020-05-31 NOTE — Progress Notes (Signed)
Acute Office Visit  Subjective:    Patient ID: [facility], male    DOB: 1961/05/04, 59 y.o.   MRN: [id number]  Chief Complaint  Patient presents with  . Foot Pain    L foot; pt had glass in foot 4 weeks ago. Went to the ER and still having pain.     HPI Patient is in today for left foot pain.  He stepped on glass about 3-4 weeks ago, and was seen in the ED for removal of the foreign object.  Today, he has some skin thickening and pain at the site of his injury.  He also has some itching, peeling skin near his 4th and fifth toes on his left foot.  He also has a wound to his left 5th toe of his left foot that is painful.  Past Medical History:  Diagnosis Date  . Gout   . Hypercholesteremia   . Hypertension    related to drug use which he has quit  . Substance abuse ([place])    alcohol, nicotine, h/o coacaine and marijuana    Past Surgical History:  Procedure Laterality Date  . COLONOSCOPY N/A 07/02/2015   Procedure: COLONOSCOPY;  Surgeon: Danie Binder, MD;  [facility];  Service: Endoscopy;  Laterality: N/A;  1:00 PM  . COLONOSCOPY WITH PROPOFOL N/A 03/27/2019   Procedure: COLONOSCOPY WITH PROPOFOL;  Surgeon: Danie Binder, MD;  two 2-4 mm polyps, one 12 mm polyp with clips placed, and tortuous left colon.  Pathology with tubular adenomas.  Next colonoscopy in 2023.   [place]   left elbow dislocation  . ELBOW FUSION Left 1979  . HERNIA REPAIR  3734 approx   umbilical hernia  . POLYPECTOMY  03/27/2019   Procedure: POLYPECTOMY;  Surgeon: Danie Binder, MD;  [facility];  Service: Endoscopy;;    Family History  Adopted: Yes  Problem Relation Age of Onset  . Cancer Mother 61       Bone   . Diabetes Mother   . Hypertension Sister   . Stroke Father 69  . Heart disease Father 61  . Diabetes Father   . Colon cancer Neg Hx     Social History   Socioeconomic History  . Marital status: Single    Spouse name: Not on file  .  Number of children: 0  . Years of education: 67  . Highest education level: 12th grade  Occupational History  . Occupation: disabled   Tobacco Use  . Smoking status: Current Some Day Smoker    Packs/day: 0.10    Years: 40.00    Pack years: 4.00    Types: Cigarettes    Last attempt to quit: 05/05/2019    Years since quitting: 1.0  . Smokeless tobacco: Never Used  . Tobacco comment: states not smoking often   Vaping Use  . Vaping Use: Never used  Substance and Sexual Activity  . Alcohol use: Yes    Comment: 1 beer every other day  . Drug use: No  . Sexual activity: Yes    Birth control/protection: Condom  Other Topics Concern  . Not on file  Social History Narrative   Lives alone    Social Determinants of Health   Financial Resource Strain:   . Difficulty of Paying Living Expenses: Not on file  Food Insecurity:   . Worried About Charity fundraiser in the Last Year: Not on file  . Ran  Out of Food in the Last Year: Not on file  Transportation Needs:   . Lack of Transportation (Medical): Not on file  . Lack of Transportation (Non-Medical): Not on file  Physical Activity:   . Days of Exercise per Week: Not on file  . Minutes of Exercise per Session: Not on file  Stress:   . Feeling of Stress : Not on file  Social Connections:   . Frequency of Communication with Friends and Family: Not on file  . Frequency of Social Gatherings with Friends and Family: Not on file  . Attends Religious Services: Not on file  . Active Member of Clubs or Organizations: Not on file  . Attends Archivist Meetings: Not on file  . Marital Status: Not on file  Intimate Partner Violence:   . Fear of Current or Ex-Partner: Not on file  . Emotionally Abused: Not on file  . Physically Abused: Not on file  . Sexually Abused: Not on file    Outpatient Medications Prior to Visit  Medication Sig Dispense Refill  . allopurinol (ZYLOPRIM) 300 MG tablet Take 1 tablet (300 mg total) by mouth  daily. 90 tablet 0  . amLODipine (NORVASC) 2.5 MG tablet Take 1 tablet (2.5 mg total) by mouth daily. 90 tablet 0  . aspirin EC 81 MG tablet Take 81 mg by mouth daily.    Marland Kitchen ezetimibe (ZETIA) 10 MG tablet Take 10 mg by mouth daily.    Marland Kitchen HYDROcodone-acetaminophen (NORCO) 5-325 MG tablet Take 1 tablet by mouth every 4 (four) hours as needed for moderate pain. 6 tablet 0  . icosapent Ethyl (VASCEPA) 1 g capsule Take 2 capsules (2 g total) by mouth 2 (two) times daily. 360 capsule 3   No facility-administered medications prior to visit.    No Known Allergies  Review of Systems  Skin:       Left foot has multiple issues per HPI       Objective:    Physical Exam Skin:    Comments: Left foot has multiple issues 1) Peeling skin to left 4th and fifth toes 2) Hyperkeratinized lesion to sole of left foot, has central pin point evidence of trauma (small blood drop); on transillumination, no evidence of glass/retained foreign body 3) Wound to side of left 5th toe (side facing his 4th toe); skin is wet, and oily- pt states he has been using neosporin copiously)     BP 139/78   Pulse 88   Temp 98.8 F (37.1 C)   Resp 20   Ht 5\' 11"  (1.803 m)   Wt 156 lb (70.8 kg)   SpO2 97%   BMI 21.76 kg/m  Wt Readings from Last 3 Encounters:  05/31/20 156 lb (70.8 kg)  05/06/20 169 lb (76.7 kg)  04/15/20 163 lb (73.9 kg)    There are no preventive care reminders to display for this patient.  There are no preventive care reminders to display for this patient.   Lab Results  Component Value Date   TSH 1.19 01/08/2019   Lab Results  Component Value Date   WBC 4.6 12/18/2019   HGB 14.2 12/18/2019   HCT 43.5 12/18/2019   MCV 102.4 (H) 12/18/2019   PLT 194 12/18/2019   Lab Results  Component Value Date   NA 139 12/18/2019   K 4.2 12/18/2019   CO2 29 12/18/2019   GLUCOSE 98 12/18/2019   BUN 22 12/18/2019   CREATININE 1.14 12/18/2019   BILITOT 0.5 10/01/2019  ALKPHOS 77 10/01/2019    AST 41 10/01/2019   ALT 44 10/01/2019   PROT 6.9 10/01/2019   ALBUMIN 3.6 10/01/2019   CALCIUM 9.3 12/18/2019   ANIONGAP 11 12/18/2019   Lab Results  Component Value Date   CHOL 367 (H) 08/26/2019   Lab Results  Component Value Date   HDL 21 (L) 08/26/2019   Lab Results  Component Value Date   [facility]  08/26/2019     Comment:     . LDL cholesterol not calculated. Triglyceride levels greater than 400 mg/dL invalidate calculated LDL results. . Reference range: <100 . Desirable range <100 mg/dL for primary prevention;   <70 mg/dL for patients with CHD or diabetic patients  with > or = 2 CHD risk factors. Marland Kitchen LDL-C is now calculated using the Martin-Hopkins  calculation, which is a validated novel method providing  better accuracy than the Friedewald equation in the  estimation of LDL-C.  Cresenciano Genre et al. Annamaria Helling. 0626;948(54): 2061-2068  (http://education.[employer].com/faq/FAQ164)    Lab Results  Component Value Date   TRIG 2,246 (H) 08/26/2019   Lab Results  Component Value Date   CHOLHDL 17.5 (H) 08/26/2019   Lab Results  Component Value Date   HGBA1C 5.0 03/19/2019       Assessment & Plan:   Problem List Items Addressed This Visit    None    Visit Diagnoses    Tinea pedis of left foot    -  Primary   Relevant Medications   tolnaftate (TINACTIN) 1 % powder (Start on 05/31/2020 11:45 AM)   Skin lesion           Meds ordered this encounter  Medications  . tolnaftate (TINACTIN) 1 % powder   Tinea Pedis (aka Athlete's Foot) -ordered tolnaftate spray twice a day for 3 weeks -if he has improvement but isn't 100% cured, he can use this for another 3 weeks -if no improvement at all, return to clinic  Skin lesions to left foot -for the lesion between his toes, he can decrease the use of neosporin to let the skin dry out -it is a little macerated today and will heal better if it dries up some -use a corn/callus pad between his toes to reduce  friction -for the lesion on the sole of his foot, we scraped the lesion with a 11 blade and transilluminated, no evidence of retained foreign body with transillumination or by feel with blade of scalpel -use soft-soled shoes to promote healing -if no improvement, will consider podiatry consult or imaging  Follow-up in 1-2 weeks if no improvement    Noreene Larsson, NP

## 2020-05-31 NOTE — Patient Instructions (Addendum)
Tinea Pedis (aka Athlete's Foot) -ordered tolnaftate spray twice a day for 3 weeks -if he has improvement but isn't 100% cured, he can use this for another 3 weeks -if no improvement at all, return to clinic  Skin lesions to left foot -for the lesion between his toes, he can decrease the use of neosporin to let the skin dry out -it is a little macerated today and will heal better if it dries up some -use a corn/callus pad between his toes to reduce friction -for the lesion on the sole of his foot, we scraped the lesion with a 11 blade and transilluminated, no evidence of retained foreign body with transillumination or by feel with blade of scalpel -use soft-soled shoes to promote healing -if no improvement, will consider podiatry consult or imaging  Follow-up in 1-2 weeks if no improvement

## 2020-06-07 ENCOUNTER — Encounter: Payer: Self-pay | Admitting: Nurse Practitioner

## 2020-06-07 ENCOUNTER — Ambulatory Visit (INDEPENDENT_AMBULATORY_CARE_PROVIDER_SITE_OTHER): Payer: Medicare HMO | Admitting: Nurse Practitioner

## 2020-06-07 ENCOUNTER — Other Ambulatory Visit: Payer: Self-pay

## 2020-06-07 VITALS — BP 119/81 | HR 94 | Temp 98.4°F | Resp 16 | Ht 71.0 in | Wt 155.0 lb

## 2020-06-07 DIAGNOSIS — M79672 Pain in left foot: Secondary | ICD-10-CM | POA: Diagnosis not present

## 2020-06-07 NOTE — Progress Notes (Signed)
Acute Office Visit  Subjective:    Patient ID: [facility], male    DOB: 31-May-1961, 59 y.o.   MRN: [id number]  Chief Complaint  Patient presents with  . Foot Pain    1 week follow up; foot still sore     HPI Patient is in today for for recurrent left foot pain.  He states that he stepped on glass about a month ago, and he is still having pain at the site.  At last visit, we scrapped the site of his pain with a #11 blade and there was no glass apparent by transillumination or glass felt when scraping his foot.  The lesion to his sole was hyperkerratotic.  Around his 4th and fifth toe of his left foot, he had peeling and macerated skin, and he was prescribed an antifungal medication, but he states he could no afford this.  Past Medical History:  Diagnosis Date  . Gout   . Hypercholesteremia   . Hypertension    related to drug use which he has quit  . Substance abuse ([place])    alcohol, nicotine, h/o coacaine and marijuana    Past Surgical History:  Procedure Laterality Date  . COLONOSCOPY N/A 07/02/2015   Procedure: COLONOSCOPY;  Surgeon: Danie Binder, MD;  [facility];  Service: Endoscopy;  Laterality: N/A;  1:00 PM  . COLONOSCOPY WITH PROPOFOL N/A 03/27/2019   Procedure: COLONOSCOPY WITH PROPOFOL;  Surgeon: Danie Binder, MD;  two 2-4 mm polyps, one 12 mm polyp with clips placed, and tortuous left colon.  Pathology with tubular adenomas.  Next colonoscopy in 2023.   [place]   left elbow dislocation  . ELBOW FUSION Left 1979  . HERNIA REPAIR  5093 approx   umbilical hernia  . POLYPECTOMY  03/27/2019   Procedure: POLYPECTOMY;  Surgeon: Danie Binder, MD;  [facility];  Service: Endoscopy;;    Family History  Adopted: Yes  Problem Relation Age of Onset  . Cancer Mother 34       Bone   . Diabetes Mother   . Hypertension Sister   . Stroke Father 26  . Heart disease Father 92  . Diabetes Father   . Colon cancer Neg Hx      Social History   Socioeconomic History  . Marital status: Single    Spouse name: Not on file  . Number of children: 0  . Years of education: 49  . Highest education level: 12th grade  Occupational History  . Occupation: disabled   Tobacco Use  . Smoking status: Current Some Day Smoker    Packs/day: 0.10    Years: 40.00    Pack years: 4.00    Types: Cigarettes    Last attempt to quit: 05/05/2019    Years since quitting: 1.0  . Smokeless tobacco: Never Used  . Tobacco comment: states not smoking often   Vaping Use  . Vaping Use: Never used  Substance and Sexual Activity  . Alcohol use: Yes    Comment: 1 beer every other day  . Drug use: No  . Sexual activity: Yes    Birth control/protection: Condom  Other Topics Concern  . Not on file  Social History Narrative   Lives alone    Social Determinants of Health   Financial Resource Strain:   . Difficulty of Paying Living Expenses: Not on file  Food Insecurity:   . Worried About Charity fundraiser  in the Last Year: Not on file  . Ran Out of Food in the Last Year: Not on file  Transportation Needs:   . Lack of Transportation (Medical): Not on file  . Lack of Transportation (Non-Medical): Not on file  Physical Activity:   . Days of Exercise per Week: Not on file  . Minutes of Exercise per Session: Not on file  Stress:   . Feeling of Stress : Not on file  Social Connections:   . Frequency of Communication with Friends and Family: Not on file  . Frequency of Social Gatherings with Friends and Family: Not on file  . Attends Religious Services: Not on file  . Active Member of Clubs or Organizations: Not on file  . Attends Archivist Meetings: Not on file  . Marital Status: Not on file  Intimate Partner Violence:   . Fear of Current or Ex-Partner: Not on file  . Emotionally Abused: Not on file  . Physically Abused: Not on file  . Sexually Abused: Not on file    Outpatient Medications Prior to Visit   Medication Sig Dispense Refill  . allopurinol (ZYLOPRIM) 300 MG tablet Take 1 tablet (300 mg total) by mouth daily. 90 tablet 0  . amLODipine (NORVASC) 2.5 MG tablet Take 1 tablet (2.5 mg total) by mouth daily. 90 tablet 0  . aspirin EC 81 MG tablet Take 81 mg by mouth daily.    Marland Kitchen ezetimibe (ZETIA) 10 MG tablet Take 10 mg by mouth daily.    Marland Kitchen HYDROcodone-acetaminophen (NORCO) 5-325 MG tablet Take 1 tablet by mouth every 4 (four) hours as needed for moderate pain. 6 tablet 0  . icosapent Ethyl (VASCEPA) 1 g capsule Take 2 capsules (2 g total) by mouth 2 (two) times daily. 360 capsule 3   Facility-Administered Medications Prior to Visit  Medication Dose Route Frequency Provider Last Rate Last Admin  . tolnaftate (TINACTIN) 1 % powder   Topical BID Noreene Larsson, NP        No Known Allergies  Review of Systems  Skin:       Painful lesion to sole of left foot       Objective:    Physical Exam HENT:     Mouth/Throat:     Pharynx: Posterior oropharyngeal erythema: plantar wart.  Skin:    Comments: Peeling and maceration of left 4-5th toes are much better. Wound between those toes has closed and started healing.  Lesion to sole of left foot looks like plantar wart.     BP 119/81   Pulse 94   Temp 98.4 F (36.9 C)   Resp 16   Ht 5\' 11"  (1.803 m)   Wt 155 lb (70.3 kg)   SpO2 95%   BMI 21.62 kg/m  Wt Readings from Last 3 Encounters:  06/07/20 155 lb (70.3 kg)  05/31/20 156 lb (70.8 kg)  05/06/20 169 lb (76.7 kg)    There are no preventive care reminders to display for this patient.  There are no preventive care reminders to display for this patient.   Lab Results  Component Value Date   TSH 1.19 01/08/2019   Lab Results  Component Value Date   WBC 4.6 12/18/2019   HGB 14.2 12/18/2019   HCT 43.5 12/18/2019   MCV 102.4 (H) 12/18/2019   PLT 194 12/18/2019   Lab Results  Component Value Date   NA 139 12/18/2019   K 4.2 12/18/2019   CO2 29 12/18/2019  GLUCOSE 98 12/18/2019   BUN 22 12/18/2019   CREATININE 1.14 12/18/2019   BILITOT 0.5 10/01/2019   ALKPHOS 77 10/01/2019   AST 41 10/01/2019   ALT 44 10/01/2019   PROT 6.9 10/01/2019   ALBUMIN 3.6 10/01/2019   CALCIUM 9.3 12/18/2019   ANIONGAP 11 12/18/2019   Lab Results  Component Value Date   CHOL 367 (H) 08/26/2019   Lab Results  Component Value Date   HDL 21 (L) 08/26/2019   Lab Results  Component Value Date   [facility]  08/26/2019     Comment:     . LDL cholesterol not calculated. Triglyceride levels greater than 400 mg/dL invalidate calculated LDL results. . Reference range: <100 . Desirable range <100 mg/dL for primary prevention;   <70 mg/dL for patients with CHD or diabetic patients  with > or = 2 CHD risk factors. Marland Kitchen LDL-C is now calculated using the Martin-Hopkins  calculation, which is a validated novel method providing  better accuracy than the Friedewald equation in the  estimation of LDL-C.  Cresenciano Genre et al. Annamaria Helling. 4765;465(03): 2061-2068  (http://education.[facility].com/faq/FAQ164)    Lab Results  Component Value Date   TRIG 2,246 (H) 08/26/2019   Lab Results  Component Value Date   CHOLHDL 17.5 (H) 08/26/2019   Lab Results  Component Value Date   HGBA1C 5.0 03/19/2019       Assessment & Plan:   Problem List Items Addressed This Visit    None    Visit Diagnoses    Left foot pain    -  Primary   Relevant Orders   Ambulatory referral to Podiatry     Left foot pain -doubtful there is retained foreign body as none was visible on transillumination and he was treated for this at ED already -will let specialist decide if x-rays are warranted -lesion to sole of his foot looks like plantar wart to me, but if he was unable to afford OTC tinactin spray.. likely will have difficulty affording plantar wart treatments that are available OTC -discussed OTC plantar wart treatment like compound W -referred to podiatry  No orders of the  defined types were placed in this encounter.    Noreene Larsson, NP

## 2020-06-07 NOTE — Patient Instructions (Signed)
Plantar Warts Plantar warts are small growths on the bottom of the foot (sole). Warts are caused by a type of germ (virus). Most warts are not painful, and they usually do not cause problems. Sometimes, plantar warts can cause pain when you walk. Warts often go away on their own in time. They can also spread to other areas of the body. Treatments may be done if needed. What are the causes?  Plantar warts are caused by a germ that is called human papillomavirus (HPV). ? Walking barefoot can cause exposure to the germ, especially if your feet are wet. ? Warts happen when HPV attacks a break in the skin of the foot. What increases the risk?  Being between 30-58 years of age.  Using public showers or locker rooms.  Having a weakened body defense system (immune system). What are the signs or symptoms?    Flat or slightly raised growths that have a rough surface and look like a callus.  Pain when you use your foot to support your body weight. How is this treated? In many cases, warts do not need treatment. Without treatment, they often go away with time. If treatment is needed or wanted, options may include:  Applying medicated solutions, creams, or patches to the wart. These make the skin soft so that layers will slowly shed away.  Freezing the wart with liquid nitrogen (cryotherapy).  Burning the wart with: ? Laser treatment. ? An electrified probe (electrocautery).  Injecting a medicine (Candida antigen) into the wart to help the body's defense system fight off the wart.  Having surgery to remove the wart.  Putting duct tape over the top of the wart (occlusion). You will leave the tape in place for as long as told by your doctor. Then you will replace it with a new strip of tape. This is done until the wart goes away. Repeat treatment may be needed if you choose to remove warts. Warts sometimes go away and come back again. Follow these instructions at home: General  instructions  Apply creams or solutions only as told by your doctor. Follow these steps if your doctor tells you to do so: ? Soak your foot in warm water. ? Remove the top layer of softened skin before you apply the medicine. You can use a pumice stone to remove the skin. ? After you apply the medicine, put a bandage over the area of the wart. ? Repeat the process every day or as told by your doctor.  Do not scratch or pick at a wart.  Wash your hands after you touch a wart.  If a wart hurts, try covering it with a bandage that has a hole in the middle.  Keep all follow-up visits as told by your doctor. This is important. How is this prevented?    Wear shoes and socks. Change your socks every day.  Keep your feet clean and dry.  Check your feet often.  Do not walk barefoot in: ? Shared locker rooms. ? Shower areas. ? Swimming pools.  Avoid direct contact with warts on other people. Contact a doctor if:  Your warts do not improve after treatment.  You have redness, swelling, or pain at the site of a wart.  You have bleeding from a wart, and the bleeding does not stop when you put light pressure on the wart.  You have diabetes and you get a wart. Summary  Warts are small growths on the skin.  When warts happen on the  bottom of the foot (sole), they are called plantar warts.  In many cases, warts do not need treatment.  Apply creams or solutions only as told by your doctor.  Do not scratch or pick at a wart. Wash your hands after you touch a wart. This information is not intended to replace advice given to you by your health care provider. Make sure you discuss any questions you have with your health care provider. Document Revised: 04/11/2018 Document Reviewed: 04/11/2018 Elsevier Patient Education  2020 [place] with Tape Occlusion Method  Tape occlusion is an inexpensive method of wart removal that involves covering the wart with tape.  It is often called the "duct tape" method.  Cut a piece of duct tape as close to the size of the wart as possible. Leave the tape in place for 6 days. If the tape falls off, put on a new piece. After 6 days, remove the tape and soak the area in water. Then gently rub the wart surface down with an emery board or pumice stone. Leave the tape off overnight. Repeat this process until the wart is gone, but not longer than 2 months. Covering a wart with tape may help it go away. However, there isn't proof that using duct tape helps to clear up warts more quickly than leaving them alone.  Using duct tape for warts is probably safe. But if your skin is very thin or fragile, be very careful when removing the tape.  If you're not sure whether a skin growth is a wart, it's best to see a health professional.

## 2020-06-08 ENCOUNTER — Ambulatory Visit (INDEPENDENT_AMBULATORY_CARE_PROVIDER_SITE_OTHER): Payer: Medicare HMO | Admitting: Family Medicine

## 2020-06-08 ENCOUNTER — Encounter: Payer: Self-pay | Admitting: Family Medicine

## 2020-06-08 VITALS — BP 119/81 | Ht 71.0 in | Wt 155.0 lb

## 2020-06-08 DIAGNOSIS — Z0000 Encounter for general adult medical examination without abnormal findings: Secondary | ICD-10-CM | POA: Diagnosis not present

## 2020-06-08 NOTE — Patient Instructions (Addendum)
Wesley Brennan , Thank you for taking time to come for your Medicare Wellness Visit. I appreciate your ongoing commitment to your health goals. Please review the following plan we discussed and let me know if I can assist you in the future.   Screening recommendations/referrals: Colonoscopy: 05/26/26 Recommended yearly ophthalmology/optometry visit for glaucoma screening and checkup Recommended yearly dental visit for hygiene and checkup  Vaccinations: Influenza vaccine: Fall 2022  Pneumococcal vaccine: Complete Tdap vaccine: 05/26/2026 Shingles vaccine: Due  Advanced directives: No  Conditions/risks identified: None  Next appointment: 06/21/20 @ 8am.  Preventive Care 40-64 Years, Male Preventive care refers to lifestyle choices and visits with your health care provider that can promote health and wellness. What does preventive care include?  A yearly physical exam. This is also called an annual well check.  Dental exams once or twice a year.  Routine eye exams. Ask your health care provider how often you should have your eyes checked.  Personal lifestyle choices, including:  Daily care of your teeth and gums.  Regular physical activity.  Eating a healthy diet.  Avoiding tobacco and drug use.  Limiting alcohol use.  Practicing safe sex.  Taking low-dose aspirin every day starting at age 44. What happens during an annual well check? The services and screenings done by your health care provider during your annual well check will depend on your age, overall health, lifestyle risk factors, and family history of disease. Counseling  Your health care provider may ask you questions about your:  Alcohol use.  Tobacco use.  Drug use.  Emotional well-being.  Home and relationship well-being.  Sexual activity.  Eating habits.  Work and work Statistician. Screening  You may have the following tests or measurements:  Height, weight, and BMI.  Blood pressure.  Lipid  and cholesterol levels. These may be checked every 5 years, or more frequently if you are over 73 years old.  Skin check.  Lung cancer screening. You may have this screening every year starting at age 65 if you have a 30-pack-year history of smoking and currently smoke or have quit within the past 15 years.  Fecal occult blood test (FOBT) of the stool. You may have this test every year starting at age 70.  Flexible sigmoidoscopy or colonoscopy. You may have a sigmoidoscopy every 5 years or a colonoscopy every 10 years starting at age 60.  Prostate cancer screening. Recommendations will vary depending on your family history and other risks.  Hepatitis C blood test.  Hepatitis B blood test.  Sexually transmitted disease (STD) testing.  Diabetes screening. This is done by checking your blood sugar (glucose) after you have not eaten for a while (fasting). You may have this done every 1-3 years. Discuss your test results, treatment options, and if necessary, the need for more tests with your health care provider. Vaccines  Your health care provider may recommend certain vaccines, such as:  Influenza vaccine. This is recommended every year.  Tetanus, diphtheria, and acellular pertussis (Tdap, Td) vaccine. You may need a Td booster every 10 years.  Zoster vaccine. You may need this after age 51.  Pneumococcal 13-valent conjugate (PCV13) vaccine. You may need this if you have certain conditions and have not been vaccinated.  Pneumococcal polysaccharide (PPSV23) vaccine. You may need one or two doses if you smoke cigarettes or if you have certain conditions. Talk to your health care provider about which screenings and vaccines you need and how often you need them. This information is not  intended to replace advice given to you by your health care provider. Make sure you discuss any questions you have with your health care provider. Document Released: 07/30/2015 Document Revised: 03/22/2016  Document Reviewed: 05/04/2015 Elsevier Interactive Patient Education  2017 [place] Prevention in the Home Falls can cause injuries. They can happen to people of all ages. There are many things you can do to make your home safe and to help prevent falls. What can I do on the outside of my home?  Regularly fix the edges of walkways and driveways and fix any cracks.  Remove anything that might make you trip as you walk through a door, such as a raised step or threshold.  Trim any bushes or trees on the path to your home.  Use bright outdoor lighting.  Clear any walking paths of anything that might make someone trip, such as rocks or tools.  Regularly check to see if handrails are loose or broken. Make sure that both sides of any steps have handrails.  Any raised decks and porches should have guardrails on the edges.  Have any leaves, snow, or ice cleared regularly.  Use sand or salt on walking paths during winter.  Clean up any spills in your garage right away. This includes oil or grease spills. What can I do in the bathroom?  Use night lights.  Install grab bars by the toilet and in the tub and shower. Do not use towel bars as grab bars.  Use non-skid mats or decals in the tub or shower.  If you need to sit down in the shower, use a plastic, non-slip stool.  Keep the floor dry. Clean up any water that spills on the floor as soon as it happens.  Remove soap buildup in the tub or shower regularly.  Attach bath mats securely with double-sided non-slip rug tape.  Do not have throw rugs and other things on the floor that can make you trip. What can I do in the bedroom?  Use night lights.  Make sure that you have a light by your bed that is easy to reach.  Do not use any sheets or blankets that are too big for your bed. They should not hang down onto the floor.  Have a firm chair that has side arms. You can use this for support while you get dressed.  Do not  have throw rugs and other things on the floor that can make you trip. What can I do in the kitchen?  Clean up any spills right away.  Avoid walking on wet floors.  Keep items that you use a lot in easy-to-reach places.  If you need to reach something above you, use a strong step stool that has a grab bar.  Keep electrical cords out of the way.  Do not use floor polish or wax that makes floors slippery. If you must use wax, use non-skid floor wax.  Do not have throw rugs and other things on the floor that can make you trip. What can I do with my stairs?  Do not leave any items on the stairs.  Make sure that there are handrails on both sides of the stairs and use them. Fix handrails that are broken or loose. Make sure that handrails are as long as the stairways.  Check any carpeting to make sure that it is firmly attached to the stairs. Fix any carpet that is loose or worn.  Avoid having throw rugs at the  top or bottom of the stairs. If you do have throw rugs, attach them to the floor with carpet tape.  Make sure that you have a light switch at the top of the stairs and the bottom of the stairs. If you do not have them, ask someone to add them for you. What else can I do to help prevent falls?  Wear shoes that:  Do not have high heels.  Have rubber bottoms.  Are comfortable and fit you well.  Are closed at the toe. Do not wear sandals.  If you use a stepladder:  Make sure that it is fully opened. Do not climb a closed stepladder.  Make sure that both sides of the stepladder are locked into place.  Ask someone to hold it for you, if possible.  Clearly mark and make sure that you can see:  Any grab bars or handrails.  First and last steps.  Where the edge of each step is.  Use tools that help you move around (mobility aids) if they are needed. These include:  Canes.  Walkers.  Scooters.  Crutches.  Turn on the lights when you go into a dark area. Replace  any light bulbs as soon as they burn out.  Set up your furniture so you have a clear path. Avoid moving your furniture around.  If any of your floors are uneven, fix them.  If there are any pets around you, be aware of where they are.  Review your medicines with your doctor. Some medicines can make you feel dizzy. This can increase your chance of falling. Ask your doctor what other things that you can do to help prevent falls. This information is not intended to replace advice given to you by your health care provider. Make sure you discuss any questions you have with your health care provider. Document Released: 04/29/2009 Document Revised: 12/09/2015 Document Reviewed: 08/07/2014 Elsevier Interactive Patient Education  2017 [employer].

## 2020-06-08 NOTE — Progress Notes (Addendum)
Subjective:   Wesley Brennan is a 59 y.o. male who presents for Medicare Annual/Subsequent preventive examination.  Method of visit: Telephone  Location of Patient: Home Location of Provider/Nurse: Office Consent was obtain for visit over the telephone. Services rendered by provider: Visit was performed via telephone I verified that I am speaking with the correct person using two identifiers.  Review of Systems Cardiac Risk Factors include: advanced age (>27men, >29 women);smoking/ tobacco exposure;hypertension     Objective:    Today's Vitals   06/08/20 0941 06/08/20 0943  BP: 119/81   Weight: 155 lb (70.3 kg)   Height: 5\' 11"  (1.803 m)   PainSc: 0-No pain 0-No pain   Body mass index is 21.62 kg/m.  Advanced Directives 06/08/2020 04/15/2020 12/18/2019 10/01/2019 09/25/2019 09/10/2019 03/27/2019  Does Patient Have a Medical Advance Directive? No No No No No No No  Type of Advance Directive - - - - - - -  Does patient want to make changes to medical advance directive? - - - - - - -  Copy of [place] in Chart? - - - - - - -  Would patient like information on creating a medical advance directive? No - Patient declined No - Patient declined - No - Patient declined No - Patient declined No - Patient declined No - Patient declined    Current Medications (verified) Outpatient Encounter Medications as of 06/08/2020  Medication Sig  . allopurinol (ZYLOPRIM) 300 MG tablet Take 1 tablet (300 mg total) by mouth daily.  Marland Kitchen amLODipine (NORVASC) 2.5 MG tablet Take 1 tablet (2.5 mg total) by mouth daily.  Marland Kitchen aspirin EC 81 MG tablet Take 81 mg by mouth daily.  Marland Kitchen ezetimibe (ZETIA) 10 MG tablet Take 10 mg by mouth daily.  Marland Kitchen HYDROcodone-acetaminophen (NORCO) 5-325 MG tablet Take 1 tablet by mouth every 4 (four) hours as needed for moderate pain.  Marland Kitchen icosapent Ethyl (VASCEPA) 1 g capsule Take 2 capsules (2 g total) by mouth 2 (two) times daily.   Facility-Administered Encounter  Medications as of 06/08/2020  Medication  . tolnaftate (TINACTIN) 1 % powder    Allergies (verified) Patient has no known allergies.   History: Past Medical History:  Diagnosis Date  . Gout   . Hypercholesteremia   . Hypertension    related to drug use which he has quit  . Substance abuse ([employer])    alcohol, nicotine, h/o coacaine and marijuana   Past Surgical History:  Procedure Laterality Date  . COLONOSCOPY N/A 07/02/2015   Procedure: COLONOSCOPY;  Surgeon: Danie Binder, MD;  [facility];  Service: Endoscopy;  Laterality: N/A;  1:00 PM  . COLONOSCOPY WITH PROPOFOL N/A 03/27/2019   Procedure: COLONOSCOPY WITH PROPOFOL;  Surgeon: Danie Binder, MD;  two 2-4 mm polyps, one 12 mm polyp with clips placed, and tortuous left colon.  Pathology with tubular adenomas.  Next colonoscopy in 2023.   Diagonal   left elbow dislocation  . ELBOW FUSION Left 1979  . HERNIA REPAIR  [id number] approx   umbilical hernia  . POLYPECTOMY  03/27/2019   Procedure: POLYPECTOMY;  Surgeon: Danie Binder, MD;  [facility];  Service: Endoscopy;;   Family History  Adopted: Yes  Problem Relation Age of Onset  . Cancer Mother 71       Bone   . Diabetes Mother   . Hypertension Sister   . Stroke Father 31  . Heart  disease Father 25  . Diabetes Father   . Colon cancer Neg Hx    Social History   Socioeconomic History  . Marital status: Single    Spouse name: Not on file  . Number of children: 0  . Years of education: 89  . Highest education level: 12th grade  Occupational History  . Occupation: disabled   Tobacco Use  . Smoking status: Current Some Day Smoker    Packs/day: 0.10    Years: 40.00    Pack years: 4.00    Types: Cigarettes    Last attempt to quit: 05/05/2019    Years since quitting: 1.0  . Smokeless tobacco: Never Used  . Tobacco comment: states not smoking often   Vaping Use  . Vaping Use: Never used  Substance and Sexual Activity  . Alcohol  use: Yes    Comment: 1 beer every other day  . Drug use: No  . Sexual activity: Yes    Birth control/protection: Condom  Other Topics Concern  . Not on file  Social History Narrative   Lives alone    Social Determinants of Health   Financial Resource Strain: Low Risk   . Difficulty of Paying Living Expenses: Not hard at all  Food Insecurity: No Food Insecurity  . Worried About Charity fundraiser in the Last Year: Never true  . Ran Out of Food in the Last Year: Never true  Transportation Needs: Unmet Transportation Needs  . Lack of Transportation (Medical): Yes  . Lack of Transportation (Non-Medical): Yes  Physical Activity: Sufficiently Active  . Days of Exercise per Week: 7 days  . Minutes of Exercise per Session: 60 min  Stress: No Stress Concern Present  . Feeling of Stress : Not at all  Social Connections: Moderately Isolated  . Frequency of Communication with Friends and Family: More than three times a week  . Frequency of Social Gatherings with Friends and Family: Three times a week  . Attends Religious Services: More than 4 times per year  . Active Member of Clubs or Organizations: No  . Attends Archivist Meetings: Never  . Marital Status: Never married    Tobacco Counseling Ready to quit: Yes Counseling given: No Comment: states not smoking often    Clinical Intake:  Pre-visit preparation completed: Yes  Pain : No/denies pain Pain Score: 0-No pain     BMI - recorded: 21.63 Nutritional Status: BMI of 19-24  Normal Nutritional Risks: None Diabetes: No  How often do you need to have someone help you when you read instructions, pamphlets, or other written materials from your doctor or pharmacy?: 1 - Never What is the last grade level you completed in school?: 12  Diabetic? no  Interpreter Needed?: No  Information entered by :: Laretta Bolster, LPN   Activities of Daily Living In your present state of health, do you have any difficulty  performing the following activities: 06/08/2020  Hearing? N  Vision? N  Difficulty concentrating or making decisions? N  Walking or climbing stairs? N  Dressing or bathing? N  Doing errands, shopping? N  Preparing Food and eating ? N  Using the Toilet? N  In the past six months, have you accidently leaked urine? N  Do you have problems with loss of bowel control? N  Managing your Medications? N  Managing your Finances? N  Housekeeping or managing your Housekeeping? N  Some recent data might be hidden    Patient Care Team: Tula Nakayama  E, MD as PCP - [facility], Alphonse Guild, MD as PCP - Cardiology (Cardiology) Danie Binder, MD (Inactive) as Consulting Physician (Gastroenterology)  Indicate any recent Medical Services you may have received from other than [facility] providers in the past year (date may be approximate).     Assessment:   This is a routine wellness examination for Michio.  Hearing/Vision screen No exam data present  Dietary issues and exercise activities discussed: Current Exercise Habits: Home exercise routine, Type of exercise: Other - see comments (riding a bike), Time (Minutes): 60, Frequency (Times/Week): 7, Weekly Exercise (Minutes/Week): 420, Intensity: Mild  Goals    . Quit Smoking     Patient down one a day     . Reduce alcohol intake to 1 servings per day      Depression Screen PHQ 2/9 Scores 06/08/2020 06/08/2020 06/07/2020 05/31/2020 06/05/2019 04/08/2019 02/21/2019  PHQ - 2 Score 0 0 0 0 0 0 0  PHQ- 9 Score - - - - - - -    Fall Risk Fall Risk  06/08/2020 06/07/2020 05/31/2020 06/05/2019 04/08/2019  Falls in the past year? 0 0 0 0 0  Number falls in past yr: 0 0 0 0 0  Injury with Fall? 0 0 0 0 0  Risk Factor Category  - - - - -  Risk for fall due to : No Fall Risks No Fall Risks No Fall Risks - -  Follow up Falls evaluation completed Falls evaluation completed Falls evaluation completed - -    Any stairs in or around the home? Yes    If so, are there any without handrails? No  Home free of loose throw rugs in walkways, pet beds, electrical cords, etc? Yes  Adequate lighting in your home to reduce risk of falls? Yes   ASSISTIVE DEVICES UTILIZED TO PREVENT FALLS:  Life alert? No  Use of a cane, walker or w/c? No  Grab bars in the bathroom? No  Shower chair or bench in shower? No  Elevated toilet seat or a handicapped toilet? No   TIMED UP AND GO:  Was the test performed? No  Length of time to ambulate n/a  Cognitive Function:     6CIT Screen 06/08/2020 06/05/2019 06/03/2018 10/30/2016  What Year? 0 points 0 points 4 points 0 points  What month? 0 points 0 points 0 points 0 points  What time? 0 points 0 points 0 points 0 points  Count back from 20 4 points 0 points 0 points 0 points  Months in reverse 4 points 4 points 4 points 0 points  Repeat phrase 0 points 2 points 0 points 0 points  Total Score 8 6 8  0    Immunizations Immunization History  Administered Date(s) Administered  . H1N1 06/26/2008  . Influenza Split 05/01/2012  . Influenza Whole 04/11/2006  . Influenza,inj,Quad PF,6+ Mos 05/02/2013, 04/27/2014, 06/17/2015, 03/08/2016, 04/18/2017, 04/05/2018, 04/08/2019, 05/31/2020  . Moderna SARS-COVID-2 Vaccination 10/13/2019, 11/10/2019  . Pneumococcal Conjugate-13 09/09/2014  . Pneumococcal Polysaccharide-23 06/03/2018  . Td 04/17/2006  . Tdap 05/26/2016    TDAP status: Up to date Flu Vaccine status: Up to date Pneumococcal vaccine status: Up to date Covid-19 vaccine status: Completed vaccines  Qualifies for Shingles Vaccine? Yes   Zostavax completed No   Shingrix Completed?: No.    Education has been provided regarding the importance of this vaccine. Patient has been advised to call insurance company to determine out of pocket expense if they have not yet received this vaccine.  Advised may also receive vaccine at local pharmacy or Health Dept. Verbalized acceptance and  understanding.  Screening Tests Health Maintenance  Topic Date Due  . TETANUS/TDAP  05/26/2026  . COLONOSCOPY  03/26/2029  . INFLUENZA VACCINE  Completed  . COVID-19 Vaccine  Completed  . Hepatitis C Screening  Completed  . HIV Screening  Completed    Health Maintenance  There are no preventive care reminders to display for this patient.  Colorectal cancer screening: Completed 03/27/19. Repeat every 10 years  Lung Cancer Screening: (Low Dose CT Chest recommended if Age 52-80 years, 30 pack-year currently smoking OR have quit w/in 15years.) does qualify.   Lung Cancer Screening Referral: sent today  Additional Screening:  Hepatitis C Screening: does not qualify  Vision Screening: Recommended annual ophthalmology exams for early detection of glaucoma and other disorders of the eye. Is the patient up to date with their annual eye exam?  Yes  Who is the provider or what is the name of the office in which the patient attends annual eye exams? [facility]. If pt is not established with a provider, would they like to be referred to a provider to establish care? No .   Dental Screening: Recommended annual dental exams for proper oral hygiene  Community Resource Referral / Chronic Care Management: CRR required this visit?  No   CCM required this visit?  No      Plan:     I have personally reviewed and noted the following in the patient's chart:   . Medical and social history . Use of alcohol, tobacco or illicit drugs  . Current medications and supplements . Functional ability and status . Nutritional status . Physical activity . Advanced directives . List of other physicians . Hospitalizations, surgeries, and ER visits in previous 12 months . Vitals . Screenings to include cognitive, depression, and falls . Referrals and appointments  In addition, I have reviewed and discussed with patient certain preventive protocols, quality metrics, and best practice recommendations.  A written personalized care plan for preventive services as well as general preventive health recommendations were provided to patient.     Perlie Mayo, NP   06/08/2020   Nurse Notes: AWV conducted in the office over the phone with patients consent given. Provider here in the office. Visit took 35 minutes to complete.

## 2020-06-21 ENCOUNTER — Ambulatory Visit: Payer: Medicare HMO | Admitting: Nurse Practitioner

## 2020-07-06 ENCOUNTER — Ambulatory Visit: Payer: Medicare HMO | Admitting: Family Medicine

## 2020-07-21 ENCOUNTER — Other Ambulatory Visit: Payer: Self-pay

## 2020-07-21 ENCOUNTER — Encounter (HOSPITAL_COMMUNITY): Payer: Self-pay | Admitting: Emergency Medicine

## 2020-07-21 ENCOUNTER — Emergency Department (HOSPITAL_COMMUNITY): Payer: Medicare HMO

## 2020-07-21 ENCOUNTER — Emergency Department (HOSPITAL_COMMUNITY)
Admission: EM | Admit: 2020-07-21 | Discharge: 2020-07-22 | Disposition: A | Discharge status: Home / Self Care | Payer: Medicare HMO | Attending: Emergency Medicine | Admitting: Emergency Medicine

## 2020-07-21 DIAGNOSIS — Z7982 Long term (current) use of aspirin: Secondary | ICD-10-CM | POA: Diagnosis not present

## 2020-07-21 DIAGNOSIS — Z20822 Contact with and (suspected) exposure to covid-19: Secondary | ICD-10-CM | POA: Diagnosis not present

## 2020-07-21 DIAGNOSIS — F17210 Nicotine dependence, cigarettes, uncomplicated: Secondary | ICD-10-CM | POA: Insufficient documentation

## 2020-07-21 DIAGNOSIS — R000 Tachycardia, unspecified: Secondary | ICD-10-CM | POA: Diagnosis not present

## 2020-07-21 DIAGNOSIS — R0789 Other chest pain: Secondary | ICD-10-CM | POA: Diagnosis not present

## 2020-07-21 DIAGNOSIS — R079 Chest pain, unspecified: Secondary | ICD-10-CM

## 2020-07-21 DIAGNOSIS — Z86010 Personal history of colonic polyps: Secondary | ICD-10-CM | POA: Insufficient documentation

## 2020-07-21 DIAGNOSIS — I10 Essential (primary) hypertension: Secondary | ICD-10-CM | POA: Diagnosis not present

## 2020-07-21 DIAGNOSIS — Z79899 Other long term (current) drug therapy: Secondary | ICD-10-CM | POA: Diagnosis not present

## 2020-07-21 DIAGNOSIS — R059 Cough, unspecified: Secondary | ICD-10-CM | POA: Diagnosis not present

## 2020-07-21 LAB — CBC
HCT: 44.2 % (ref 39.0–52.0)
Hemoglobin: 14.7 g/dL (ref 13.0–17.0)
MCH: 33.2 pg (ref 26.0–34.0)
MCHC: 33.3 g/dL (ref 30.0–36.0)
MCV: 99.8 fL (ref 80.0–100.0)
Platelets: 209 10*3/uL (ref 150–400)
RBC: 4.43 MIL/uL (ref 4.22–5.81)
RDW: 11.9 % (ref 11.5–15.5)
WBC: 7 10*3/uL (ref 4.0–10.5)
nRBC: 0 % (ref 0.0–0.2)

## 2020-07-21 LAB — TROPONIN I (HIGH SENSITIVITY): Troponin I (High Sensitivity): 26 ng/L — ABNORMAL HIGH (ref <18)

## 2020-07-21 LAB — BASIC METABOLIC PANEL
Anion gap: 17 — ABNORMAL HIGH (ref 5–15)
BUN: 19 mg/dL (ref 8–23)
CO2: 17 mmol/L — ABNORMAL LOW (ref 22–32)
Calcium: 9.2 mg/dL (ref 8.9–10.3)
Chloride: 102 mmol/L (ref 98–111)
Creatinine, Ser: 1.22 mg/dL (ref 0.61–1.24)
GFR, Estimated: >60 mL/min (ref >60)
Glucose, Bld: 103 mg/dL — ABNORMAL HIGH (ref 70–99)
Potassium: 3.8 mmol/L (ref 3.5–5.1)
Sodium: 136 mmol/L (ref 135–145)

## 2020-07-21 LAB — RESP PANEL BY RT-PCR (FLU A&B, COVID) ARPGX2
Influenza A by PCR: NEGATIVE
Influenza B by PCR: NEGATIVE
SARS Coronavirus 2 by RT PCR: NEGATIVE

## 2020-07-21 NOTE — ED Triage Notes (Addendum)
Pt c/o chest pain described as sharp non-radiating that began a few days ago.   Pt has a cough that began today.

## 2020-07-22 ENCOUNTER — Encounter: Payer: Self-pay | Admitting: Family Medicine

## 2020-07-22 ENCOUNTER — Telehealth (INDEPENDENT_AMBULATORY_CARE_PROVIDER_SITE_OTHER): Payer: Medicare HMO | Admitting: Family Medicine

## 2020-07-22 VITALS — BP 134/82 | Ht 71.0 in | Wt 153.0 lb

## 2020-07-22 DIAGNOSIS — R7989 Other specified abnormal findings of blood chemistry: Secondary | ICD-10-CM

## 2020-07-22 DIAGNOSIS — E7849 Other hyperlipidemia: Secondary | ICD-10-CM | POA: Diagnosis not present

## 2020-07-22 DIAGNOSIS — R079 Chest pain, unspecified: Secondary | ICD-10-CM | POA: Diagnosis not present

## 2020-07-22 DIAGNOSIS — R778 Other specified abnormalities of plasma proteins: Secondary | ICD-10-CM

## 2020-07-22 DIAGNOSIS — R0789 Other chest pain: Secondary | ICD-10-CM | POA: Diagnosis not present

## 2020-07-22 DIAGNOSIS — I10 Essential (primary) hypertension: Secondary | ICD-10-CM

## 2020-07-22 DIAGNOSIS — E785 Hyperlipidemia, unspecified: Secondary | ICD-10-CM | POA: Diagnosis not present

## 2020-07-22 DIAGNOSIS — F17219 Nicotine dependence, cigarettes, with unspecified nicotine-induced disorders: Secondary | ICD-10-CM

## 2020-07-22 DIAGNOSIS — F10288 Alcohol dependence with other alcohol-induced disorder: Secondary | ICD-10-CM

## 2020-07-22 DIAGNOSIS — F17200 Nicotine dependence, unspecified, uncomplicated: Secondary | ICD-10-CM | POA: Diagnosis not present

## 2020-07-22 LAB — TROPONIN I (HIGH SENSITIVITY): Troponin I (High Sensitivity): 24 ng/L — ABNORMAL HIGH (ref <18)

## 2020-07-22 LAB — D-DIMER, QUANTITATIVE: D-Dimer, Quant: 0.3 ug/mL-FEU (ref 0.00–0.50)

## 2020-07-22 NOTE — Patient Instructions (Addendum)
Annual exam in office with MD in 8 to 10 weeks, call if you need me sooner  You are referred to Cardiology for evaluation, you need to stay in the eD for complete evaluation when you go. Tests were suggestive of heart damage, and you went because of chest pain   Please get covid booster, this is overdue  Please get fasting lipid, cmp and EGFr and TSH in the next 1 to 2 weeks  Thanks for choosing [facility], we consider it a privelige to serve you.   Need to QUIT , alcohol,  Cigarettes,and all recreational drugs. All of these will damage your heart

## 2020-07-22 NOTE — ED Provider Notes (Signed)
[facility] EMERGENCY DEPARTMENT Provider Note   CSN: [id number] Arrival date & time: 07/21/20  1946     History Chief Complaint  Patient presents with  . Chest Pain    Wesley Brennan is a 60 y.o. male.  Patient presents to the emergency department for evaluation of chest pain.  Patient reports he has been having sharp pains in the center of his chest for 2 days.  He has mostly noticed that at night when he lies down, has not had any pain during the day.  Pain does not radiate.  No shortness of breath.        Past Medical History:  Diagnosis Date  . Gout   . Hypercholesteremia   . Hypertension    related to drug use which he has quit  . Substance abuse ([place])    alcohol, nicotine, h/o coacaine and marijuana    Patient Active Problem List   Diagnosis Date Noted  . Elevated ferritin 08/22/2019  . Elevated LFTs 05/07/2019  . Transaminitis 01/12/2019  . History of colonic polyps 09/09/2018  . Heel pain 04/24/2018  . Smoking greater than 30 pack years 04/24/2018  . Essential hypertension 01/06/2018  . Gout 01/06/2018  . Chest pain 01/06/2018  . Tobacco use 12/13/2016  . Left knee pain 12/13/2016  . Tubular adenoma of colon 07/08/2015  . Vision loss, bilateral 04/27/2014  . Allergic rhinitis 12/15/2013  . Hyperlipidemia LDL goal <100 06/26/2008  . Alcohol dependence ([place]) 06/26/2008  . NICOTINE ADDICTION 11/27/2007  . MENTAL RETARDATION 11/27/2007    Past Surgical History:  Procedure Laterality Date  . COLONOSCOPY N/A 07/02/2015   Procedure: COLONOSCOPY;  Surgeon: Danie Binder, MD;  [facility];  Service: Endoscopy;  Laterality: N/A;  1:00 PM  . COLONOSCOPY WITH PROPOFOL N/A 03/27/2019   Procedure: COLONOSCOPY WITH PROPOFOL;  Surgeon: Danie Binder, MD;  two 2-4 mm polyps, one 12 mm polyp with clips placed, and tortuous left colon.  Pathology with tubular adenomas.  Next colonoscopy in 2023.   [place]   left elbow dislocation  . ELBOW  FUSION Left 1979  . HERNIA REPAIR  [id number] approx   umbilical hernia  . POLYPECTOMY  03/27/2019   Procedure: POLYPECTOMY;  Surgeon: Danie Binder, MD;  [facility];  Service: Endoscopy;;       Family History  Adopted: Yes  Problem Relation Age of Onset  . Cancer Mother 83       Bone   . Diabetes Mother   . Hypertension Sister   . Stroke Father 9  . Heart disease Father 56  . Diabetes Father   . Colon cancer Neg Hx     Social History   Tobacco Use  . Smoking status: Current Some Day Smoker    Packs/day: 0.10    Years: 40.00    Pack years: 4.00    Types: Cigarettes    Last attempt to quit: 05/05/2019    Years since quitting: 1.2  . Smokeless tobacco: Never Used  . Tobacco comment: states not smoking often   Vaping Use  . Vaping Use: Never used  Substance Use Topics  . Alcohol use: Yes    Comment: 1 beer every other day  . Drug use: No    Home Medications Prior to Admission medications   Medication Sig Start Date End Date Taking? Authorizing Provider  allopurinol (ZYLOPRIM) 300 MG tablet Take 1 tablet (300 mg total) by mouth daily.  06/25/19   Fayrene Helper, MD  amLODipine (NORVASC) 2.5 MG tablet Take 1 tablet (2.5 mg total) by mouth daily. 06/25/19   Fayrene Helper, MD  aspirin EC 81 MG tablet Take 81 mg by mouth daily.    [provider]  ezetimibe (ZETIA) 10 MG tablet Take 10 mg by mouth daily. 06/25/19   [provider]  HYDROcodone-acetaminophen (NORCO) 5-325 MG tablet Take 1 tablet by mouth every 4 (four) hours as needed for moderate pain. [id number]   Delora Fuel, MD  icosapent Ethyl (VASCEPA) 1 g capsule Take 2 capsules (2 g total) by mouth 2 (two) times daily. 09/03/19   Arnoldo Lenis, MD    Allergies    Patient has no known allergies.  Review of Systems   Review of Systems  Cardiovascular: Positive for chest pain.  All other systems reviewed and are negative.   Physical Exam Updated Vital Signs BP 112/71    Pulse 92   Temp 98.8 F (37.1 C) (Oral)   Resp 17   Ht 5\' 11"  (1.803 m)   Wt 69.4 kg   SpO2 95%   BMI 21.34 kg/m   Physical Exam Vitals and nursing note reviewed.  Constitutional:      General: He is not in acute distress.    Appearance: Normal appearance. He is well-developed and well-nourished.  HENT:     Head: Normocephalic and atraumatic.     Right Ear: Hearing normal.     Left Ear: Hearing normal.     Nose: Nose normal.     Mouth/Throat:     Mouth: Oropharynx is clear and moist and mucous membranes are normal.  Eyes:     Extraocular Movements: EOM normal.     Conjunctiva/sclera: Conjunctivae normal.     Pupils: Pupils are equal, round, and reactive to light.  Cardiovascular:     Rate and Rhythm: Regular rhythm.     Heart sounds: S1 normal and S2 normal. No murmur heard. No friction rub. No gallop.   Pulmonary:     Effort: Pulmonary effort is normal. No respiratory distress.     Breath sounds: Normal breath sounds.  Chest:     Chest wall: No tenderness.  Abdominal:     General: Bowel sounds are normal.     Palpations: Abdomen is soft. There is no hepatosplenomegaly.     Tenderness: There is no abdominal tenderness. There is no guarding or rebound. Negative signs include Murphy's sign and McBurney's sign.     Hernia: No hernia is present.  Musculoskeletal:        General: Normal range of motion.     Cervical back: Normal range of motion and neck supple.  Skin:    General: Skin is warm, dry and intact.     Findings: No rash.     Nails: There is no cyanosis.  Neurological:     Mental Status: He is alert and oriented to person, place, and time.     GCS: GCS eye subscore is 4. GCS verbal subscore is 5. GCS motor subscore is 6.     Cranial Nerves: No cranial nerve deficit.     Sensory: No sensory deficit.     Coordination: Coordination normal.     Deep Tendon Reflexes: Strength normal.  Psychiatric:        Mood and Affect: Mood and affect normal.        Speech:  Speech normal.        Behavior: Behavior normal.  Thought Content: Thought content normal.     ED Results / Procedures / Treatments   Labs (all labs ordered are listed, but only abnormal results are displayed) Labs Reviewed  BASIC METABOLIC PANEL - Abnormal; Notable for the following components:      Result Value   CO2 17 (*)    Glucose, Bld 103 (*)    Anion gap 17 (*)    All other components within normal limits  TROPONIN I (HIGH SENSITIVITY) - Abnormal; Notable for the following components:   Troponin I (High Sensitivity) 26 (*)    All other components within normal limits  RESP PANEL BY RT-PCR (FLU A&B, COVID) [id number]  CBC  D-DIMER, QUANTITATIVE (NOT AT [facility])  TROPONIN I (HIGH SENSITIVITY)    EKG EKG Interpretation  Date/Time:  Wednesday July 21 2020 19:52:04 EST Ventricular Rate:  106 PR Interval:  154 QRS Duration: 84 QT Interval:  336 QTC Calculation: 446 R Axis:   53 Text Interpretation: Sinus tachycardia Nonspecific ST and T wave abnormality Confirmed by Cathren Laine ([id number]) on 07/21/2020 8:16:53 PM   Radiology DG Chest Port 1 View  Result Date: 07/21/2020 CLINICAL DATA:  Chest pain and cough. EXAM: PORTABLE CHEST 1 VIEW COMPARISON:  December 18, 2019 FINDINGS: The heart size and mediastinal contours are within normal limits. Both lungs are clear. The visualized skeletal structures are unremarkable. IMPRESSION: No active disease. Electronically Signed   By: Aram Candela M.D.   On: 07/21/2020 21:04    Procedures Procedures (including critical care time)  Medications Ordered in ED Medications - No data to display  ED Course  I have reviewed the triage vital signs and the nursing notes.  Pertinent labs & imaging results that were available during my care of the patient were reviewed by me and considered in my medical decision making (see chart for details).    MDM Rules/Calculators/A&P                          Patient presents with somewhat  atypical chest pain.  He has been experiencing sharp pains in the chest only when he lays down at night.  EKG did not show acute changes.  First troponin slightly elevated.  Discussed these findings with the patient, informed him that he might be having a small heart attack.  Added on D-dimer.  D-dimer is negative.  Second troponin slightly lower than the first which is reassuring.  Recommended admission for further evaluation of possible cardiac etiology of his chest pain.  Patient reluctant to stay.  After discussing risks and benefits of leaving versus being admitted for monitoring, patient decided to leave AGAINST MEDICAL ADVICE.  He left the department before he can be discharged.  Final Clinical Impression(s) / ED Diagnoses Final diagnoses:  Chest pain    Rx / DC Orders ED Discharge Orders    None       Lee Kalt, Canary Brim, MD 07/22/20 [phone]

## 2020-07-22 NOTE — Progress Notes (Signed)
Virtual Visit via Telephone Note  I connected with Wesley Brennan on 07/22/20 at  2:00 PM EST by telephone and verified that I am speaking with the correct person using two identifiers.  Location: Patient: home Provider: office   I discussed the limitations, risks, security and privacy concerns of performing an evaluation and management service by telephone and the availability of in person appointments. I also discussed with the patient that there may be a patient responsible charge related to this service. The patient expressed understanding and agreed to proceed.   History of Present Illness: F/uU chronic problems and recent ED visit. Pt has not ben evaluated by me in over 8 months, at last visit when I called his sister with his permission to discuss his alcohol dependence, states he does not want me to call again. Recently in the Ed intoxicated and most recently wih chest pain, abnormal  Troponin and cocaine use in the past. He left AMA Denies current chest pain   Observations/Objective:  BP 134/82    Ht 5\' 11"  (1.803 m)    Wt 153 lb (69.4 kg)    BMI 21.34 kg/m  Good communication with no confusion and intact memory. Alert and oriented x 3 No signs of respiratory distress during speech   Assessment and Plan:  NICOTINE ADDICTION Asked:confirms currently smokes cigarettes Assess: Unwilling to set a quit date, but is cutting back Advise: needs to QUIT to reduce risk of cancer, cardio and cerebrovascular disease Assist: counseled for 5 minutes and literature provided Arrange: follow up in 2 to 4 months   Alcohol dependence (HCC) Advised to d/c EtOh  Essential hypertension DASH diet and commitment to daily physical activity for a minimum of 30 minutes discussed and encouraged, as a part of hypertension management. The importance of attaining a healthy weight is also discussed.  BP/Weight 07/23/2020 07/22/2020 07/22/2020 07/21/2020 06/08/2020 06/07/2020 05/31/2020  Systolic BP 133  06/02/2020 125 - 119 119 139  Diastolic BP 94 82 87 - 81 81 78  Wt. (Lbs) 153 153 - 153 155 155 156  BMI 21.34 21.34 - 21.34 21.62 21.62 21.76       Chest pain Possibly cocaine induced chest pain with abn tropionin x 2 , refer cardiology. Advised to d/c cocaine use as this can be lethal Advised against leaving ED AMA  Elevated LFTs Alcohol induced   Follow Up Instructions:    I discussed the assessment and treatment plan with the patient. The patient was provided an opportunity to ask questions and all were answered. The patient agreed with the plan and demonstrated an understanding of the instructions.   The patient was advised to call back or seek an in-person evaluation if the symptoms worsen or if the condition fails to improve as anticipated.  I provided 25 minutes of non-face-to-face time during this encounter.   789, MD

## 2020-07-23 ENCOUNTER — Other Ambulatory Visit: Payer: Self-pay

## 2020-07-23 ENCOUNTER — Emergency Department (HOSPITAL_COMMUNITY)
Admission: EM | Admit: 2020-07-23 | Discharge: 2020-07-23 | Disposition: A | Discharge status: Home / Self Care | Payer: Medicare HMO | Attending: Emergency Medicine | Admitting: Emergency Medicine

## 2020-07-23 ENCOUNTER — Encounter (HOSPITAL_COMMUNITY): Payer: Self-pay

## 2020-07-23 ENCOUNTER — Emergency Department (HOSPITAL_COMMUNITY): Payer: Medicare HMO

## 2020-07-23 DIAGNOSIS — F17210 Nicotine dependence, cigarettes, uncomplicated: Secondary | ICD-10-CM | POA: Diagnosis not present

## 2020-07-23 DIAGNOSIS — R0789 Other chest pain: Secondary | ICD-10-CM | POA: Diagnosis not present

## 2020-07-23 DIAGNOSIS — R079 Chest pain, unspecified: Secondary | ICD-10-CM | POA: Diagnosis not present

## 2020-07-23 DIAGNOSIS — I10 Essential (primary) hypertension: Secondary | ICD-10-CM | POA: Insufficient documentation

## 2020-07-23 LAB — RAPID URINE DRUG SCREEN, HOSP PERFORMED
Amphetamines: NOT DETECTED
Barbiturates: NOT DETECTED
Benzodiazepines: NOT DETECTED
Cocaine: POSITIVE — AB
Opiates: NOT DETECTED
Tetrahydrocannabinol: NOT DETECTED

## 2020-07-23 LAB — BASIC METABOLIC PANEL
Anion gap: 9 (ref 5–15)
BUN: 24 mg/dL — ABNORMAL HIGH (ref 8–23)
CO2: 22 mmol/L (ref 22–32)
Calcium: 9.2 mg/dL (ref 8.9–10.3)
Chloride: 106 mmol/L (ref 98–111)
Creatinine, Ser: 1.15 mg/dL (ref 0.61–1.24)
GFR, Estimated: >60 mL/min (ref >60)
Glucose, Bld: 123 mg/dL — ABNORMAL HIGH (ref 70–99)
Potassium: 4.4 mmol/L (ref 3.5–5.1)
Sodium: 137 mmol/L (ref 135–145)

## 2020-07-23 LAB — TROPONIN I (HIGH SENSITIVITY)
Troponin I (High Sensitivity): 16 ng/L (ref <18)
Troponin I (High Sensitivity): 14 ng/L (ref <18)

## 2020-07-23 LAB — CBC
HCT: 44 % (ref 39.0–52.0)
Hemoglobin: 14.4 g/dL (ref 13.0–17.0)
MCH: 32.6 pg (ref 26.0–34.0)
MCHC: 32.7 g/dL (ref 30.0–36.0)
MCV: 99.5 fL (ref 80.0–100.0)
Platelets: 189 10*3/uL (ref 150–400)
RBC: 4.42 MIL/uL (ref 4.22–5.81)
RDW: 11.9 % (ref 11.5–15.5)
WBC: 8.3 10*3/uL (ref 4.0–10.5)
nRBC: 0 % (ref 0.0–0.2)

## 2020-07-23 NOTE — Discharge Instructions (Signed)
You were seen in the emergency department today with chest pain.  Your lab work does not show signs of a heart attack.  Please follow closely with your primary care doctor.  Your urine test did show cocaine in your system.  If you are using this drug please stop as it can cause problems with your heart including heart attacks and even death.  Return to the emergency department any new or suddenly worsening symptoms.

## 2020-07-23 NOTE — ED Notes (Signed)
Pt to radiology.

## 2020-07-23 NOTE — ED Notes (Signed)
Pt is stating that he has a sore on the inside of his pinky toe that he would like the dr to look at.

## 2020-07-23 NOTE — ED Provider Notes (Signed)
Emergency Department Provider Note   I have reviewed the triage vital signs and the nursing notes.   HISTORY  Chief Complaint Chest Pain (Pain started 2 am sharp pains)   HPI Wesley Brennan is a 60 y.o. male with PMH of HTN, HLD, and atypical CP returns to the emergency department with sharp pain in his left chest similar to symptoms experienced on 1/5.  Patient states at that time he had lab work done but ultimately decided to go home rather than be admitted with troponins mildly elevated to the 20s.  He had pain returned overnight.  He describes it as sharp and stabbing and only when he lies flat.  He states symptoms are relieved with sitting forward or sitting up.  He is not having pain currently.  He states the pain will last as Johnnye Sandford as he is lying flat and improved with soon as he sits up.  No other modifying factors or radiation of symptoms.  No fevers or chills.  No shortness of breath symptoms.   In review of the chart the patient had a visit with cardiology in April 2021 with similar chest pain.  He had work-up including echo and nuclear stress test at that time with possible inferior infarct though thought to be related to artifact ultimately.    Past Medical History:  Diagnosis Date  . Gout   . Hypercholesteremia   . Hypertension    related to drug use which he has quit  . Substance abuse ([place])    alcohol, nicotine, h/o coacaine and marijuana    Patient Active Problem List   Diagnosis Date Noted  . Elevated ferritin 08/22/2019  . Elevated LFTs 05/07/2019  . Transaminitis 01/12/2019  . History of colonic polyps 09/09/2018  . Heel pain 04/24/2018  . Smoking greater than 30 pack years 04/24/2018  . Essential hypertension 01/06/2018  . Gout 01/06/2018  . Chest pain 01/06/2018  . Tobacco use 12/13/2016  . Left knee pain 12/13/2016  . Tubular adenoma of colon 07/08/2015  . Vision loss, bilateral 04/27/2014  . Allergic rhinitis 12/15/2013  . Hyperlipidemia LDL goal  <100 06/26/2008  . Alcohol dependence ([place]) 06/26/2008  . NICOTINE ADDICTION 11/27/2007  . MENTAL RETARDATION 11/27/2007    Past Surgical History:  Procedure Laterality Date  . COLONOSCOPY N/A 07/02/2015   Procedure: COLONOSCOPY;  Surgeon: Danie Binder, MD;  Location: [facility];  Service: Endoscopy;  Laterality: N/A;  1:00 PM  . COLONOSCOPY WITH PROPOFOL N/A 03/27/2019   Procedure: COLONOSCOPY WITH PROPOFOL;  Surgeon: Danie Binder, MD;  two 2-4 mm polyps, one 12 mm polyp with clips placed, and tortuous left colon.  Pathology with tubular adenomas.  Next colonoscopy in 2023.   [place]   left elbow dislocation  . ELBOW FUSION Left 1979  . HERNIA REPAIR  7616 approx   umbilical hernia  . POLYPECTOMY  03/27/2019   Procedure: POLYPECTOMY;  Surgeon: Danie Binder, MD;  Location: [facility];  Service: Endoscopy;;    Allergies Patient has no known allergies.  Family History  Adopted: Yes  Problem Relation Age of Onset  . Cancer Mother 69       Bone   . Diabetes Mother   . Hypertension Sister   . Stroke Father 32  . Heart disease Father 25  . Diabetes Father   . Colon cancer Neg Hx     Social History Social History   Tobacco Use  . Smoking  status: Current Some Day Smoker    Packs/day: 0.10    Years: 40.00    Pack years: 4.00    Types: Cigarettes    Last attempt to quit: 05/05/2019    Years since quitting: 1.2  . Smokeless tobacco: Never Used  . Tobacco comment: states not smoking often   Vaping Use  . Vaping Use: Never used  Substance Use Topics  . Alcohol use: Yes    Comment: 1 beer every other day  . Drug use: No    Review of Systems  Constitutional: No fever/chills Eyes: No visual changes. ENT: No sore throat. Cardiovascular: Positive sharp, positional chest pain. Respiratory: Denies shortness of breath. Gastrointestinal: No abdominal pain.  No nausea, no vomiting.  No diarrhea.  No constipation. Genitourinary: Negative for  dysuria. Musculoskeletal: Negative for back pain. Skin: Negative for rash. Neurological: Negative for headaches, focal weakness or numbness.  10-point ROS otherwise negative.  ____________________________________________   PHYSICAL EXAM:  VITAL SIGNS: ED Triage Vitals  Enc Vitals Group     BP 07/23/20 0848 (!) 132/93     Pulse Rate 07/23/20 0848 98     Resp 07/23/20 0848 17     Temp 07/23/20 0848 (!) 97.5 F (36.4 C)     Temp Source 07/23/20 0848 Oral     SpO2 07/23/20 0848 99 %     Weight 07/23/20 0849 153 lb (69.4 kg)     Height 07/23/20 0849 5\' 11"  (1.803 m)   Constitutional: Alert and oriented. Well appearing and in no acute distress. Eyes: Conjunctivae are normal.  Head: Atraumatic. Nose: No congestion/rhinnorhea. Mouth/Throat: Mucous membranes are moist.  Neck: No stridor.   Cardiovascular: Normal rate, regular rhythm. Good peripheral circulation. Grossly normal heart sounds.   Respiratory: Normal respiratory effort.  No retractions. Lungs CTAB. Gastrointestinal: Soft and nontender. No distention.  Musculoskeletal: No gross deformities of extremities. Neurologic:  Normal speech and language.  Skin:  Skin is warm, dry and intact. No rash noted.   ____________________________________________   LABS (all labs ordered are listed, but only abnormal results are displayed)  Labs Reviewed  BASIC METABOLIC PANEL - Abnormal; Notable for the following components:      Result Value   Glucose, Bld 123 (*)    BUN 24 (*)    All other components within normal limits  RAPID URINE DRUG SCREEN, HOSP PERFORMED - Abnormal; Notable for the following components:   Cocaine POSITIVE (*)    All other components within normal limits  CBC  TROPONIN I (HIGH SENSITIVITY)  TROPONIN I (HIGH SENSITIVITY)   ____________________________________________  EKG   EKG Interpretation  Date/Time:  Friday July 23 2020 09:02:06 EST Ventricular Rate:  87 PR Interval:    QRS  Duration: 98 QT Interval:  363 QTC Calculation: 437 R Axis:   22 Text Interpretation: Sinus rhythm Consider left atrial enlargement Probable anteroseptal infarct, old Nonspecific T abnormalities, lateral leads Similar to prior tracing 07/21/20 No STEMI Confirmed by Nanda Quinton [phone]) on 07/23/2020 9:07:10 AM       ____________________________________________  RADIOLOGY  DG Chest 2 View  Result Date: 07/23/2020 CLINICAL DATA:  Chest pain. EXAM: CHEST - 2 VIEW COMPARISON:  July 21, 2020. FINDINGS: The heart size and mediastinal contours are within normal limits. Both lungs are clear. No pneumothorax or pleural effusion is noted. The visualized skeletal structures are unremarkable. IMPRESSION: No active cardiopulmonary disease. Electronically Signed   By: Marijo Conception M.D.   On: 07/23/2020 10:22  ____________________________________________   PROCEDURES  Procedure(s) performed:   Procedures  None  ____________________________________________   INITIAL IMPRESSION / ASSESSMENT AND PLAN / ED COURSE  Pertinent labs & imaging results that were available during my care of the patient were reviewed by me and considered in my medical decision making (see chart for details).   Patient returns to the emergency department with sharp, atypical chest pain starting this morning.  Pain is positional and only when patient is lying flat and improved with sitting up.  My suspicion overall for ACS is very low.  Patient's labs from his 1/5 ED visit reviewed showing mild troponin elevation but no ischemic change on EKG.  D-dimer at that time was negative.  He has a cardiology note from April 2021 where similar pain is described and relatively unremarkable work-up at that time.  EKG today shows some nonspecific ST changes similar to his prior tracing.  Plan for serial troponins and chest x-ray repeated here.   11:58 AM  Patient's second troponin coming back within normal limits.  His UDS is cocaine  positive.  Discussed this with him and that is a risk factor for coronary spasm AMI and sudden cardiac arrhythmia/death.  He verbalizes understanding but is not ready to take action to stop at this time.  I was asked to evaluate the patient's little toe on his left foot.  It is normal in appearance with no deformity.  No tenderness to palpation.  There is no skin breakdown or sores along the toe. Possible nail related pain. Will discuss with PCP.   At this time, I do not feel there is any life-threatening condition present. I have reviewed and discussed all results (EKG, imaging, lab, urine as appropriate), exam findings with patient. I have reviewed nursing notes and appropriate previous records.  I feel the patient is safe to be discharged home without further emergent workup. Discussed usual and customary return precautions. Patient and family (if present) verbalize understanding and are comfortable with this plan.  Patient will follow-up with their primary care provider. If they do not have a primary care provider, information for follow-up has been provided to them. All questions have been answered.  ____________________________________________  FINAL CLINICAL IMPRESSION(S) / ED DIAGNOSES  Final diagnoses:  Atypical chest pain    Note:  This document was prepared using Dragon voice recognition software and may include unintentional dictation errors.  Nanda Quinton, MD, [facility]    Tuesday Terlecki, [facility], MD 07/26/20 [phone]

## 2020-07-23 NOTE — ED Triage Notes (Signed)
Pt presents to ED with sharp chest pains that started at 2 am. No n/v noted. No radiating pain.

## 2020-07-25 ENCOUNTER — Encounter: Payer: Self-pay | Admitting: Family Medicine

## 2020-07-25 NOTE — Assessment & Plan Note (Signed)
Asked:confirms currently smokes cigarettes °Assess: Unwilling to set a quit date, but is cutting back °Advise: needs to QUIT to reduce risk of cancer, cardio and cerebrovascular disease °Assist: counseled for 5 minutes and literature provided °Arrange: follow up in 2 to 4 months ° °

## 2020-07-25 NOTE — Assessment & Plan Note (Signed)
DASH diet and commitment to daily physical activity for a minimum of 30 minutes discussed and encouraged, as a part of hypertension management. The importance of attaining a healthy weight is also discussed.  BP/Weight 07/23/2020 07/22/2020 07/22/2020 07/21/2020 06/08/2020 06/07/2020 40/81/4481  Systolic BP 856 314 970 - 263 785 885  Diastolic BP 94 82 87 - 81 81 78  Wt. (Lbs) 153 153 - 153 155 155 156  BMI 21.34 21.34 - 21.34 21.62 21.62 21.76

## 2020-07-25 NOTE — Assessment & Plan Note (Signed)
Possibly cocaine induced chest pain with abn tropionin x 2 , refer cardiology. Advised to d/c cocaine use as this can be lethal Advised against leaving ED AMA

## 2020-07-25 NOTE — Assessment & Plan Note (Signed)
Advised to d/c EtOh

## 2020-07-25 NOTE — Assessment & Plan Note (Signed)
Alcohol induced

## 2020-08-09 NOTE — Progress Notes (Signed)
Cardiology Office Note    Date:  08/11/2020   ID:  Wesley, Brennan 08/18/1960, MRN [id number]  PCP:  Wesley Helper, MD  Cardiologist: Wesley Dolly, MD EPS: None  Chief Complaint  Patient presents with  . Hospitalization Follow-up    History of Present Illness:  Wesley Brennan is a 60 y.o. male with history of atypical chest pain NST 12/2017 suggest inferior infarct although may be artifact no ischemia LVEF 34% echo 12/2017 LVEF 50% with no wall motion abnormalities, history of abnormal LFTs followed by GI thought to be alcohol related, hyperlipidemia with elevated triglycerides as high as 2200 but down to 233 03/2019 on Vascepa.  Advised to drink less alcohol.  Patient last had a telemedicine visit with Dr. Harl Brennan 11/04/2019 and was doing well tolerating high levels of exercise.  Patient was in the ER 07/21/2020 and 07/23/2020 with atypical chest pain, UDS positive for cocaine, mild troponin elevation EKG without acute change.  Patient says no further chest pain. Denies any further cocaine use-only uses on holidays. Drinks 40 ounces beer daily.Smoking 1 ppd but says he's cut back. No dyspnea, palpitations, edema. Rides his bike everywhere-[place] [address] to do yard work and Office manager. Rides about 10-20 miles daily.  Past Medical History:  Diagnosis Date  . Gout   . Hypercholesteremia   . Hypertension    related to drug use which he has quit  . Substance abuse ([place])    alcohol, nicotine, h/o coacaine and marijuana    Past Surgical History:  Procedure Laterality Date  . COLONOSCOPY N/A 07/02/2015   Procedure: COLONOSCOPY;  Surgeon: Danie Binder, MD;  [facility];  Service: Endoscopy;  Laterality: N/A;  1:00 PM  . COLONOSCOPY WITH PROPOFOL N/A 03/27/2019   Procedure: COLONOSCOPY WITH PROPOFOL;  Surgeon: Danie Binder, MD;  two 2-4 mm polyps, one 12 mm polyp with clips placed, and tortuous left colon.  Pathology with tubular adenomas.  Next  colonoscopy in 2023.   [place]   left elbow dislocation  . ELBOW FUSION Left 1979  . HERNIA REPAIR  6967 approx   umbilical hernia  . POLYPECTOMY  03/27/2019   Procedure: POLYPECTOMY;  Surgeon: Danie Binder, MD;  Location: [facility];  Service: Endoscopy;;    Current Medications: Current Meds  Medication Sig  . allopurinol (ZYLOPRIM) 300 MG tablet Take 1 tablet (300 mg total) by mouth daily.  Marland Kitchen amLODipine (NORVASC) 2.5 MG tablet Take 1 tablet (2.5 mg total) by mouth daily.  Marland Kitchen aspirin EC 81 MG tablet Take 81 mg by mouth daily.  Marland Kitchen ezetimibe (ZETIA) 10 MG tablet Take 10 mg by mouth daily.  Marland Kitchen icosapent Ethyl (VASCEPA) 1 g capsule Take 2 capsules (2 g total) by mouth 2 (two) times daily.     Allergies:   Patient has no known allergies.   Social History   Socioeconomic History  . Marital status: Single    Spouse name: Not on file  . Number of children: 0  . Years of education: 51  . Highest education level: 12th grade  Occupational History  . Occupation: disabled   Tobacco Use  . Smoking status: Current Some Day Smoker    Packs/day: 0.10    Years: 40.00    Pack years: 4.00    Types: Cigarettes    Last attempt to quit: 05/05/2019    Years since quitting: 1.2  . Smokeless tobacco: Never Used  .  Tobacco comment: states not smoking often   Vaping Use  . Vaping Use: Never used  Substance and Sexual Activity  . Alcohol use: Yes    Comment: 1 beer every other day  . Drug use: No  . Sexual activity: Yes    Birth control/protection: Condom  Other Topics Concern  . Not on file  Social History Narrative   Lives alone    Social Determinants of Health   Financial Resource Strain: Low Risk   . Difficulty of Paying Living Expenses: Not hard at all  Food Insecurity: No Food Insecurity  . Worried About Charity fundraiser in the Last Year: Never true  . Ran Out of Food in the Last Year: Never true  Transportation Needs: Unmet Transportation Needs  . Lack of  Transportation (Medical): Yes  . Lack of Transportation (Non-Medical): Yes  Physical Activity: Sufficiently Active  . Days of Exercise per Week: 7 days  . Minutes of Exercise per Session: 60 min  Stress: No Stress Concern Present  . Feeling of Stress : Not at all  Social Connections: Moderately Isolated  . Frequency of Communication with Friends and Family: More than three times a week  . Frequency of Social Gatherings with Friends and Family: Three times a week  . Attends Religious Services: More than 4 times per year  . Active Member of Clubs or Organizations: No  . Attends Archivist Meetings: Never  . Marital Status: Never married     Family History:  The patient's family history includes Cancer (age of onset: 67) in his mother; Diabetes in his father and mother; Heart disease (age of onset: 53) in his father; Hypertension in his sister; Stroke (age of onset: 107) in his father. He was adopted.   ROS:   Please see the history of present illness.    ROS All other systems reviewed and are negative.   PHYSICAL EXAM:   VS:  BP 128/78   Pulse 80   Ht 5\' 11"  (1.803 m)   Wt 156 lb 3.2 oz (70.9 kg)   SpO2 98%   BMI 21.79 kg/m   Physical Exam  GEN: Thin, in no acute distress  Neck: no JVD, carotid bruits, or masses Cardiac:RRR; no murmurs, rubs, or gallops  Respiratory:  Decreased breath sounds but clear to auscultation bilaterally, normal work of breathing GI: soft, nontender, nondistended, + BS Ext: without cyanosis, clubbing, or edema, Good distal pulses bilaterally MS: no deformity or atrophy  Skin: warm and dry, no rash Neuro:  Alert and Oriented x 3  Psych: euthymic mood, full affect  Wt Readings from Last 3 Encounters:  08/11/20 156 lb 3.2 oz (70.9 kg)  07/23/20 153 lb (69.4 kg)  07/22/20 153 lb (69.4 kg)      Studies/Labs Reviewed:   EKG:  EKG is not ordered today.  The ekg reviewed from 07/26/2020 normal sinus rhythm with poor R wave progression  anteriorly and inferior lateral ST-T wave changes, no acute change but a little more pronounced than 04/22/2020 Recent Labs: 10/01/2019: ALT 44 07/23/2020: BUN 24; Creatinine, Ser 1.15; Hemoglobin 14.4; Platelets 189; Potassium 4.4; Sodium 137   Lipid Panel    Component Value Date/Time   CHOL 367 (H) 08/26/2019 0730   TRIG 2,246 (H) 08/26/2019 0730   HDL 21 (L) 08/26/2019 0730   CHOLHDL 17.5 (H) 08/26/2019 0730   VLDL 67 (H) 12/13/2016 0852   [place]  08/26/2019 0730     Comment:     .  LDL cholesterol not calculated. Triglyceride levels greater than 400 mg/dL invalidate calculated LDL results. . Reference range: <100 . Desirable range <100 mg/dL for primary prevention;   <70 mg/dL for patients with CHD or diabetic patients  with > or = 2 CHD risk factors. Marland Kitchen LDL-C is now calculated using the Martin-Hopkins  calculation, which is a validated novel method providing  better accuracy than the Friedewald equation in the  estimation of LDL-C.  Cresenciano Genre et al. Annamaria Helling. 0981;191(47): 2061-2068  (http://education.[employer].com/faq/FAQ164)     Additional studies/ records that were reviewed today include:  2D echo 01/07/2018 Study Conclusions   - Left ventricle: The cavity size was normal. Wall thickness was    normal. Systolic function was normal. The estimated ejection    fraction was = 50%. Wall motion was normal; there were no    regional wall motion abnormalities. The study is not technically    sufficient to allow evaluation of LV diastolic function.  - Aortic valve: Valve area (VTI): 1.8 cm^2. Valve area (Vmax): 2.18    cm^2.  - Atrial septum: No defect or patent foramen ovale was identified.  - Technically adequate study.   -------------------------------------------------------------------  Study data:  No prior study was available for comparison.  Study  status:  Routine.  Procedure:  Transthoracic echocardiography.  Image quality was adequate.          Transthoracic   echocardiography.  M-mode, complete 2D, spectral Doppler, and color  Doppler.  Birthdate:  Patient birthdate: 1961/06/21.  Age:  Patient  is 60 yr old.  Sex:  Gender: male.    BMI: 20.5 kg/m^2.  Blood  pressure:     129/70  Patient status:  Inpatient.  Study date:  Study date: 01/07/2018. Study time: 01:52 PM.  Location:  Bedside.    NST 12/2017 Blood pressure demonstrated a hypertensive response to exercise. Exercise stopped due to high bp's, peak exercise does not reflect patients exercise capacity. There was no ST segment deviation noted during stress. 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 There is adjacent gut radiotracer uptake in this area, however the wall motion appears hypokinetic suggesting true prior infarct. No current ischemia This is a high risk study. High risk based on decreased LVEF. There is no current myocardium at jeopardy. Recommend correlating with echo. The left ventricular ejection fraction is moderately decreased (34%).    Risk Assessment/Calculations:         ASSESSMENT:    1. Precordial pain   2. ETOH abuse   3. Substance abuse ([place])   4. Other hyperlipidemia   5. Tobacco abuse      PLAN:  In order of problems listed above:  History of atypical chest pain with Lexiscan Myoview 01/07/2018 no ischemia but high risk because of low LVEF.  Follow-up echo showed normal LVEF and no wall motion abnormalities.  In the ER 07/21/2020 and 07/23/2020 with atypical chest pain.  UDS was positive for cocaine.  Some nonspecific ST changes inferior lateral.  Troponins, 24, 16 and 14. No further chest pain and riding bike daily. Continue current meds  ETOH has cut back to 40 ounces beer daily. Recommend further reduction  Substance abuse-advided against cocain  HLD likely related to heavy ETOH was started on Vascepa and zetia  Tobacco abuse-smoking cessation discussed  Shared Decision Making/Informed Consent        Medication  Adjustments/Labs and Tests Ordered: Current medicines are reviewed at length with the patient today.  Concerns regarding medicines are outlined above.  Medication changes, Labs and Tests ordered today are listed in the Patient Instructions below. Patient Instructions   Medication Instructions:  Your physician recommends that you continue on your current medications as directed. Please refer to the Current Medication list given to you today.  *If you need a refill on your cardiac medications before your next appointment, please call your pharmacy*   Lab Work: None If you have labs (blood work) drawn today and your tests are completely normal, you will receive your results only by: Marland Kitchen MyChart Message (if you have MyChart) OR . A paper copy in the mail If you have any lab test that is abnormal or we need to change your treatment, we will call you to review the results.   Follow-Up: At [facility], you and your health needs are our priority.  As part of our continuing mission to provide you with exceptional heart care, we have created designated Provider Care Teams.  These Care Teams include your primary Cardiologist (physician) and Advanced Practice Providers (APPs -  Physician Assistants and Nurse Practitioners) who all work together to provide you with the care you need, when you need it.  We recommend signing up for the patient portal called "MyChart".  Sign up information is provided on this After Visit Summary.  MyChart is used to connect with patients for Virtual Visits (Telemedicine).  Patients are able to view lab/test results, encounter notes, upcoming appointments, etc.  Non-urgent messages can be sent to your provider as well.   To learn more about what you can do with MyChart, go to [email or web address].    Your next appointment:   6 month(s)  The format for your next appointment:   In Person  Provider:   You may see Wesley Dolly, MD or one of the following Advanced  Practice Providers on your designated Care Team:    Bernerd Pho, PA-C   Ermalinda Barrios, [place]     Other Instructions  Managing the Challenge of Quitting Smoking Quitting smoking is a physical and mental challenge. You will face cravings, withdrawal symptoms, and temptation. Before quitting, work with your health care provider to make a plan that can help you manage quitting. Preparation can help you quit and keep you from giving in. How to manage lifestyle changes Managing stress Stress can make you want to smoke, and wanting to smoke may cause stress. It is important to find ways to manage your stress. You might try some of the following:  Practice relaxation techniques. ? Breathe slowly and deeply, in through your nose and out through your mouth. ? Listen to music. ? Soak in a bath or take a shower. ? Imagine a peaceful place or vacation.  Get some support. ? Talk with family or friends about your stress. ? Join a support group. ? Talk with a counselor or therapist.  Get some physical activity. ? Go for a walk, run, or bike ride. ? Play a favorite sport. ? Practice yoga.   Medicines Talk with your health care provider about medicines that might help you deal with cravings and make quitting easier for you. Relationships Social situations can be difficult when you are quitting smoking. To manage this, you can:  Avoid parties and other social situations where people might be smoking.  Avoid alcohol.  Leave right away if you have the urge to smoke.  Explain to your family and friends that you are quitting smoking. Ask for support and let them know you might be a bit grumpy.  Plan activities where smoking is not an option. General instructions Be aware that many people gain weight after they quit smoking. However, not everyone does. To keep from gaining weight, have a plan in place before you quit and stick to the plan after you quit. Your plan should include:  Having  healthy snacks. When you have a craving, it may help to: ? Eat popcorn, carrots, celery, or other cut vegetables. ? Chew sugar-free gum.  Changing how you eat. ? Eat small portion sizes at meals. ? Eat 4-6 small meals throughout the day instead of 1-2 large meals a day. ? Be mindful when you eat. Do not watch television or do other things that might distract you as you eat.  Exercising regularly. ? Make time to exercise each day. If you do not have time for a long workout, do short bouts of exercise for 5-10 minutes several times a day. ? Do some form of strengthening exercise, such as weight lifting. ? Do some exercise that gets your heart beating and causes you to breathe deeply, such as walking fast, running, swimming, or biking. This is very important.  Drinking plenty of water or other low-calorie or no-calorie drinks. Drink 6-8 glasses of water daily.   How to recognize withdrawal symptoms Your body and mind may experience discomfort as you try to get used to not having nicotine in your system. These effects are called withdrawal symptoms. They may include:  Feeling hungrier than normal.  Having trouble concentrating.  Feeling irritable or restless.  Having trouble sleeping.  Feeling depressed.  Craving a cigarette. To manage withdrawal symptoms:  Avoid places, people, and activities that trigger your cravings.  Remember why you want to quit.  Get plenty of sleep.  Avoid coffee and other caffeinated drinks. These may worsen some of your symptoms. These symptoms may surprise you. But be assured that they are normal to have when quitting smoking. How to manage cravings Come up with a plan for how to deal with your cravings. The plan should include the following:  A definition of the specific situation you want to deal with.  An alternative action you will take.  A clear idea for how this action will help.  The name of someone who might help you with this. Cravings  usually last for 5-10 minutes. Consider taking the following actions to help you with your plan to deal with cravings:  Keep your mouth busy. ? Chew sugar-free gum. ? Suck on hard candies or a straw. ? Brush your teeth.  Keep your hands and body busy. ? Change to a different activity right away. ? Squeeze or play with a ball. ? Do an activity or a hobby, such as making bead jewelry, practicing needlepoint, or working with wood. ? Mix up your normal routine. ? Take a short exercise break. Go for a quick walk or run up and down stairs.  Focus on doing something kind or helpful for someone else.  Call a friend or family member to talk during a craving.  Join a support group.  Contact a quitline. Where to find support To get help or find a support group:  Call the [facility]'s Smoking Quitline: 1-800-QUIT NOW (424)798-3016)  Visit the website of the Substance Abuse and [place]: ktimeonline.com  Text QUIT to SmokefreeTXTMQ:317211 Where to find more information Visit these websites to find more information on quitting smoking:  [place]: www.smokefree.gov  American Lung Association: www.lung.org  American  Cancer Society: www.cancer.org  Centers for Disease Control and Prevention: http://www.wolf.info/  American Heart Association: www.heart.org Contact a health care provider if:  You want to change your plan for quitting.  The medicines you are taking are not helping.  Your eating feels out of control or you cannot sleep. Get help right away if:  You feel depressed or become very anxious. Summary  Quitting smoking is a physical and mental challenge. You will face cravings, withdrawal symptoms, and temptation to smoke again. Preparation can help you as you go through these challenges.  Try different techniques to manage stress, handle social situations, and prevent weight gain.  You can deal with cravings by keeping your  mouth busy (such as by chewing gum), keeping your hands and body busy, calling family or friends, or contacting a quitline for people who want to quit smoking.  You can deal with withdrawal symptoms by avoiding places where people smoke, getting plenty of rest, and avoiding drinks with caffeine. This information is not intended to replace advice given to you by your health care provider. Make sure you discuss any questions you have with your health care provider. Document Revised: 04/22/2019 Document Reviewed: 04/22/2019 Elsevier Patient Education  2021 [place], Ermalinda Barrios, [place]  08/11/2020 9:12 AM    [facility] [address], [place], [place]  [address] Phone: [phone]; Fax: [phone]

## 2020-08-11 ENCOUNTER — Encounter: Payer: Self-pay | Admitting: Physician Assistant

## 2020-08-11 ENCOUNTER — Other Ambulatory Visit: Payer: Self-pay

## 2020-08-11 ENCOUNTER — Other Ambulatory Visit (HOSPITAL_COMMUNITY)
Admission: RE | Admit: 2020-08-11 | Discharge: 2020-08-11 | Disposition: A | Discharge status: Home / Self Care | Payer: Medicare HMO | Source: Ambulatory Visit | Attending: Family Medicine | Admitting: Family Medicine

## 2020-08-11 ENCOUNTER — Ambulatory Visit (INDEPENDENT_AMBULATORY_CARE_PROVIDER_SITE_OTHER): Payer: Medicare HMO | Admitting: Physician Assistant

## 2020-08-11 VITALS — BP 128/78 | HR 80 | Ht 71.0 in | Wt 156.2 lb

## 2020-08-11 DIAGNOSIS — R072 Precordial pain: Secondary | ICD-10-CM

## 2020-08-11 DIAGNOSIS — F1910 Other psychoactive substance abuse, uncomplicated: Secondary | ICD-10-CM | POA: Diagnosis not present

## 2020-08-11 DIAGNOSIS — E785 Hyperlipidemia, unspecified: Secondary | ICD-10-CM | POA: Insufficient documentation

## 2020-08-11 DIAGNOSIS — Z720 Tobacco use: Secondary | ICD-10-CM

## 2020-08-11 DIAGNOSIS — I10 Essential (primary) hypertension: Secondary | ICD-10-CM | POA: Insufficient documentation

## 2020-08-11 DIAGNOSIS — F1010 Alcohol abuse, uncomplicated: Secondary | ICD-10-CM | POA: Diagnosis not present

## 2020-08-11 DIAGNOSIS — E7849 Other hyperlipidemia: Secondary | ICD-10-CM | POA: Diagnosis not present

## 2020-08-11 LAB — COMPREHENSIVE METABOLIC PANEL
ALT: 25 U/L (ref 0–44)
AST: 39 U/L (ref 15–41)
Albumin: 4 g/dL (ref 3.5–5.0)
Alkaline Phosphatase: 79 U/L (ref 38–126)
Anion gap: 7 (ref 5–15)
BUN: 18 mg/dL (ref 8–23)
CO2: 25 mmol/L (ref 22–32)
Calcium: 9.4 mg/dL (ref 8.9–10.3)
Chloride: 106 mmol/L (ref 98–111)
Creatinine, Ser: 1.05 mg/dL (ref 0.61–1.24)
GFR, Estimated: >60 mL/min (ref >60)
Glucose, Bld: 83 mg/dL (ref 70–99)
Potassium: 4.2 mmol/L (ref 3.5–5.1)
Sodium: 138 mmol/L (ref 135–145)
Total Bilirubin: 0.7 mg/dL (ref 0.3–1.2)
Total Protein: 7.6 g/dL (ref 6.5–8.1)

## 2020-08-11 LAB — LIPID PANEL
Cholesterol: 243 mg/dL — ABNORMAL HIGH (ref 0–200)
HDL: 97 mg/dL (ref >40)
LDL Cholesterol: 112 mg/dL — ABNORMAL HIGH (ref 0–99)
Total CHOL/HDL Ratio: 2.5 RATIO
Triglycerides: 169 mg/dL — ABNORMAL HIGH (ref <150)
VLDL: 34 mg/dL (ref 0–40)

## 2020-08-11 LAB — TSH: TSH: 2.085 u[IU]/mL (ref 0.350–4.500)

## 2020-08-11 NOTE — Patient Instructions (Signed)
Medication Instructions:  Your physician recommends that you continue on your current medications as directed. Please refer to the Current Medication list given to you today.  *If you need a refill on your cardiac medications before your next appointment, please call your pharmacy*   Lab Work: None If you have labs (blood work) drawn today and your tests are completely normal, you will receive your results only by: Marland Kitchen MyChart Message (if you have MyChart) OR . A paper copy in the mail If you have any lab test that is abnormal or we need to change your treatment, we will call you to review the results.   Follow-Up: At [facility], you and your health needs are our priority.  As part of our continuing mission to provide you with exceptional heart care, we have created designated Provider Care Teams.  These Care Teams include your primary Cardiologist (physician) and Advanced Practice Providers (APPs -  Physician Assistants and Nurse Practitioners) who all work together to provide you with the care you need, when you need it.  We recommend signing up for the patient portal called "MyChart".  Sign up information is provided on this After Visit Summary.  MyChart is used to connect with patients for Virtual Visits (Telemedicine).  Patients are able to view lab/test results, encounter notes, upcoming appointments, etc.  Non-urgent messages can be sent to your provider as well.   To learn more about what you can do with MyChart, go to [email or web address].    Your next appointment:   6 month(s)  The format for your next appointment:   In Person  Provider:   You may see Carlyle Dolly, MD or one of the following Advanced Practice Providers on your designated Care Team:    Bernerd Pho, PA-C   Ermalinda Barrios, [place]     Other Instructions  Managing the Challenge of Quitting Smoking Quitting smoking is a physical and mental challenge. You will face cravings, withdrawal symptoms,  and temptation. Before quitting, work with your health care provider to make a plan that can help you manage quitting. Preparation can help you quit and keep you from giving in. How to manage lifestyle changes Managing stress Stress can make you want to smoke, and wanting to smoke may cause stress. It is important to find ways to manage your stress. You might try some of the following:  Practice relaxation techniques. ? Breathe slowly and deeply, in through your nose and out through your mouth. ? Listen to music. ? Soak in a bath or take a shower. ? Imagine a peaceful place or vacation.  Get some support. ? Talk with family or friends about your stress. ? Join a support group. ? Talk with a counselor or therapist.  Get some physical activity. ? Go for a walk, run, or bike ride. ? Play a favorite sport. ? Practice yoga.   Medicines Talk with your health care provider about medicines that might help you deal with cravings and make quitting easier for you. Relationships Social situations can be difficult when you are quitting smoking. To manage this, you can:  Avoid parties and other social situations where people might be smoking.  Avoid alcohol.  Leave right away if you have the urge to smoke.  Explain to your family and friends that you are quitting smoking. Ask for support and let them know you might be a bit grumpy.  Plan activities where smoking is not an option. General instructions Be aware that many people gain weight  after they quit smoking. However, not everyone does. To keep from gaining weight, have a plan in place before you quit and stick to the plan after you quit. Your plan should include:  Having healthy snacks. When you have a craving, it may help to: ? Eat popcorn, carrots, celery, or other cut vegetables. ? Chew sugar-free gum.  Changing how you eat. ? Eat small portion sizes at meals. ? Eat 4-6 small meals throughout the day instead of 1-2 large meals a  day. ? Be mindful when you eat. Do not watch television or do other things that might distract you as you eat.  Exercising regularly. ? Make time to exercise each day. If you do not have time for a long workout, do short bouts of exercise for 5-10 minutes several times a day. ? Do some form of strengthening exercise, such as weight lifting. ? Do some exercise that gets your heart beating and causes you to breathe deeply, such as walking fast, running, swimming, or biking. This is very important.  Drinking plenty of water or other low-calorie or no-calorie drinks. Drink 6-8 glasses of water daily.   How to recognize withdrawal symptoms Your body and mind may experience discomfort as you try to get used to not having nicotine in your system. These effects are called withdrawal symptoms. They may include:  Feeling hungrier than normal.  Having trouble concentrating.  Feeling irritable or restless.  Having trouble sleeping.  Feeling depressed.  Craving a cigarette. To manage withdrawal symptoms:  Avoid places, people, and activities that trigger your cravings.  Remember why you want to quit.  Get plenty of sleep.  Avoid coffee and other caffeinated drinks. These may worsen some of your symptoms. These symptoms may surprise you. But be assured that they are normal to have when quitting smoking. How to manage cravings Come up with a plan for how to deal with your cravings. The plan should include the following:  A definition of the specific situation you want to deal with.  An alternative action you will take.  A clear idea for how this action will help.  The name of someone who might help you with this. Cravings usually last for 5-10 minutes. Consider taking the following actions to help you with your plan to deal with cravings:  Keep your mouth busy. ? Chew sugar-free gum. ? Suck on hard candies or a straw. ? Brush your teeth.  Keep your hands and body busy. ? Change to  a different activity right away. ? Squeeze or play with a ball. ? Do an activity or a hobby, such as making bead jewelry, practicing needlepoint, or working with wood. ? Mix up your normal routine. ? Take a short exercise break. Go for a quick walk or run up and down stairs.  Focus on doing something kind or helpful for someone else.  Call a friend or family member to talk during a craving.  Join a support group.  Contact a quitline. Where to find support To get help or find a support group:  Call the National Cancer Institute's Smoking Quitline: 1-800-QUIT NOW (681)457-7084((340) 273-9614)  Visit the website of the Substance Abuse and Mental Health Services Administration: SkateOasis.com.ptwww.samhsa.gov  Text QUIT to SmokefreeTXT: 295621: 478848 Where to find more information Visit these websites to find more information on quitting smoking:  National Cancer Institute: www.smokefree.gov  American Lung Association: www.lung.org  American Cancer Society: www.cancer.org  Centers for Disease Control and Prevention: FootballExhibition.com.brwww.cdc.gov  American Heart Association: www.heart.org Contact  a health care provider if:  You want to change your plan for quitting.  The medicines you are taking are not helping.  Your eating feels out of control or you cannot sleep. Get help right away if:  You feel depressed or become very anxious. Summary  Quitting smoking is a physical and mental challenge. You will face cravings, withdrawal symptoms, and temptation to smoke again. Preparation can help you as you go through these challenges.  Try different techniques to manage stress, handle social situations, and prevent weight gain.  You can deal with cravings by keeping your mouth busy (such as by chewing gum), keeping your hands and body busy, calling family or friends, or contacting a quitline for people who want to quit smoking.  You can deal with withdrawal symptoms by avoiding places where people smoke, getting plenty of rest, and  avoiding drinks with caffeine. This information is not intended to replace advice given to you by your health care provider. Make sure you discuss any questions you have with your health care provider. Document Revised: 04/22/2019 Document Reviewed: 04/22/2019 Elsevier Patient Education  [place].

## 2020-08-12 ENCOUNTER — Other Ambulatory Visit: Payer: Self-pay

## 2020-08-12 ENCOUNTER — Telehealth: Payer: Self-pay

## 2020-08-12 DIAGNOSIS — E790 Hyperuricemia without signs of inflammatory arthritis and tophaceous disease: Secondary | ICD-10-CM

## 2020-08-12 NOTE — Telephone Encounter (Signed)
Noted, will get next time

## 2020-08-12 NOTE — Telephone Encounter (Signed)
He had his labs done at the hosp and they only hold the blood for 24 hrs so the uric acid cannot be added on.

## 2020-09-23 ENCOUNTER — Encounter: Payer: Medicare HMO | Admitting: Family Medicine

## 2020-09-30 ENCOUNTER — Encounter: Payer: Medicare HMO | Admitting: Family Medicine

## 2020-10-13 ENCOUNTER — Encounter: Payer: Medicare HMO | Admitting: Nurse Practitioner

## 2020-11-01 ENCOUNTER — Encounter: Payer: Medicare HMO | Admitting: Nurse Practitioner

## 2020-11-01 ENCOUNTER — Other Ambulatory Visit: Payer: Self-pay | Admitting: Family Medicine

## 2020-11-01 DIAGNOSIS — M1A0710 Idiopathic chronic gout, right ankle and foot, without tophus (tophi): Secondary | ICD-10-CM

## 2020-11-01 DIAGNOSIS — I10 Essential (primary) hypertension: Secondary | ICD-10-CM

## 2020-11-19 ENCOUNTER — Encounter: Payer: Medicare HMO | Admitting: Nurse Practitioner

## 2021-02-08 ENCOUNTER — Ambulatory Visit: Payer: Medicare HMO | Admitting: Cardiology

## 2021-03-17 IMAGING — DX DG CHEST 1V PORT
1 series · 1 of 1 positions shown · non-contrast
Comparison: December 18, 2019

CLINICAL DATA: Chest pain and cough.

EXAM:
PORTABLE CHEST 1 VIEW

[chest ap]
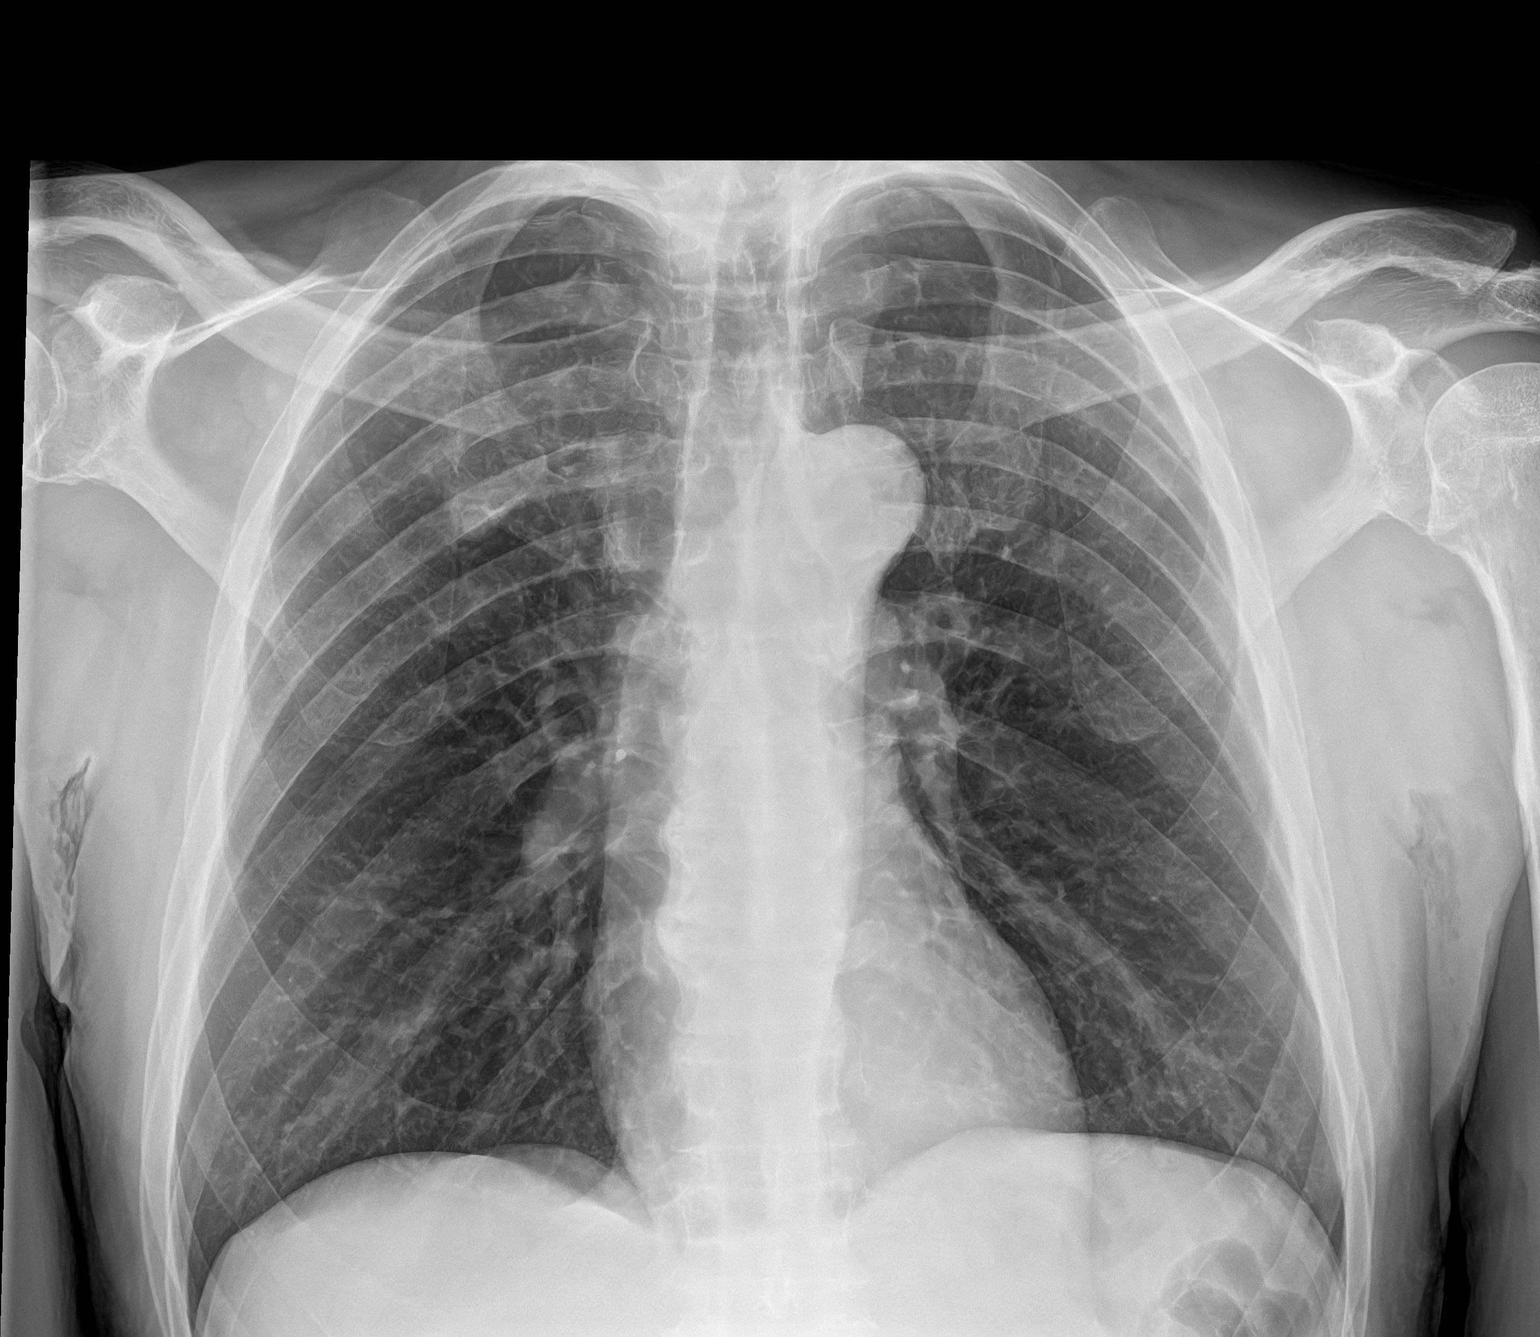

[1 of 1 positions shown; findings below may reference images not displayed]

FINDINGS: The heart size and mediastinal contours are within normal limits.
Both lungs are clear. The visualized skeletal structures are
unremarkable.
IMPRESSION: No active disease.

## 2021-05-12 ENCOUNTER — Ambulatory Visit: Payer: Medicare HMO | Admitting: Cardiology

## 2021-05-12 NOTE — Progress Notes (Deleted)
Clinical Summary Wesley Brennan is a 60 y.o.male seen today for follow up of the following medical problems.      1. Chest pain - admit 12/2017 with atypical chest pain.   - described at that time as sharp pain left chest, can be up to 10/10. Can have some chills associated, no other symptoms. Can last up to 2-3 hours constantly. Can be positional, worst with laying on left side, better with laying on right side - very physically active, rides his bike up to 2 miles a day, had no exertional symptoms. Uses pushing mower without symptoms.  - trops negative, EKG nonspecific changes - 12/2017 nuclear stress suggests inferior infarct though may be artifact, no ischemia. LVEF 34% - 12/2017 echo LVEF 50%, no WMAs     - denies any recent symptoms     2. Abnormal LFTs - followed by GI, thought to be EtOH related - by 09/2019 labs LFTs had normalized.        3. Hyperlipidemia - elevated TGs 2200, collected 2/9 at 730AM. I have confimred with him this was a fasting sample - working to wean [facility], previously was using regularly.  03/2019 TC 233 HDL 45 TG 1500   - last visit started vascepa 2g bid. Avoided fenofibrate due to elevated LFTs at the time.  - continuing to drink though he reports lower amounts.     4. Substane abuse - prior UDS +cocaine, multiple prior positives   Past Medical History:  Diagnosis Date   Gout    Hypercholesteremia    Hypertension    related to drug use which he has quit   Substance abuse ([place])    alcohol, nicotine, h/o coacaine and marijuana     No Known Allergies   Current Outpatient Medications  Medication Sig Dispense Refill   allopurinol (ZYLOPRIM) 300 MG tablet TAKE 1 TABLET EVERY DAY 90 tablet 0   amLODipine (NORVASC) 2.5 MG tablet TAKE 1 TABLET EVERY DAY 90 tablet 0   aspirin EC 81 MG tablet Take 81 mg by mouth daily.     ezetimibe (ZETIA) 10 MG tablet Take 10 mg by mouth daily.     icosapent Ethyl (VASCEPA) 1 g capsule Take 2 capsules (2 g  total) by mouth 2 (two) times daily. 360 capsule 3   No current facility-administered medications for this visit.     Past Surgical History:  Procedure Laterality Date   COLONOSCOPY N/A 07/02/2015   Procedure: COLONOSCOPY;  Surgeon: Danie Binder, MD;  Location: [facility];  Service: Endoscopy;  Laterality: N/A;  1:00 PM   COLONOSCOPY WITH PROPOFOL N/A 03/27/2019   Procedure: COLONOSCOPY WITH PROPOFOL;  Surgeon: Danie Binder, MD;  two 2-4 mm polyps, one 12 mm polyp with clips placed, and tortuous left colon.  Pathology with tubular adenomas.  Next colonoscopy in 2023.    ELBOW FUSION  1993   left elbow dislocation   ELBOW FUSION Left 1979   HERNIA REPAIR  6283 approx   umbilical hernia   POLYPECTOMY  03/27/2019   Procedure: POLYPECTOMY;  Surgeon: Danie Binder, MD;  Location: [facility];  Service: Endoscopy;;     No Known Allergies    Family History  Adopted: Yes  Problem Relation Age of Onset   Cancer Mother 7       Bone    Diabetes Mother    Hypertension Sister    Stroke Father 19   Heart disease Father 15  Diabetes Father    Colon cancer Neg Hx      Social History Wesley Brennan reports that he has been smoking cigarettes. He has a 4.00 pack-year smoking history. He has never used smokeless tobacco. Wesley Brennan reports current alcohol use.   Review of Systems CONSTITUTIONAL: No weight loss, fever, chills, weakness or fatigue.  HEENT: Eyes: No visual loss, blurred vision, double vision or yellow sclerae.No hearing loss, sneezing, congestion, runny nose or sore throat.  SKIN: No rash or itching.  CARDIOVASCULAR:  RESPIRATORY: No shortness of breath, cough or sputum.  GASTROINTESTINAL: No anorexia, nausea, vomiting or diarrhea. No abdominal pain or blood.  GENITOURINARY: No burning on urination, no polyuria NEUROLOGICAL: No headache, dizziness, syncope, paralysis, ataxia, numbness or tingling in the extremities. No change in bowel or bladder control.   MUSCULOSKELETAL: No muscle, back pain, joint pain or stiffness.  LYMPHATICS: No enlarged nodes. No history of splenectomy.  PSYCHIATRIC: No history of depression or anxiety.  ENDOCRINOLOGIC: No reports of sweating, cold or heat intolerance. No polyuria or polydipsia.  Marland Kitchen   Physical Examination There were no vitals filed for this visit. There were no vitals filed for this visit.  Gen: resting comfortably, no acute distress HEENT: no scleral icterus, pupils equal round and reactive, no palptable cervical adenopathy,  CV Resp: Clear to auscultation bilaterally GI: abdomen is soft, non-tender, non-distended, normal bowel sounds, no hepatosplenomegaly MSK: extremities are warm, no edema.  Skin: warm, no rash Neuro:  no focal deficits Psych: appropriate affect   Diagnostic Studies     Assessment and Plan   1. Chest pain - atypical symptoms that have resolved. Probable artifact on nuclear stress test, no evidence of ischemia. Echo with normal LVEF and no WMAs. Tolerates high levels of exertion - no recurrent symptoms, continue to monitor.      2. Hyperlipidemia - likely related to heavy EtOH use he is weaning - severely elevated TGs, started on vascepa - repeat lipid panel. If LFTs remain normal could add fenofibrate.      Wesley Brennan, M.D., F.A.C.C.

## 2021-06-12 NOTE — Patient Instructions (Addendum)
Wesley Brennan , Thank you for taking time to come for your Medicare Wellness Visit. I appreciate your ongoing commitment to your health goals. Please review the following plan we discussed and let me know if I can assist you in the future.   These are the goals we discussed:  Goals      Patient Stated     Get flu vaccine      Quit Smoking     Patient down one a day      Reduce alcohol intake to 1 servings per day        This is a list of the screening recommended for you and due dates:  Health Maintenance  Topic Date Due   Zoster (Shingles) Vaccine (1 of 2) Never done   COVID-19 Vaccine (3 - Booster for Moderna series) 01/05/2020   Flu Shot  02/14/2021   Pneumococcal Vaccination (3 - PPSV23 if available, else PCV20) 10/10/2023   Tetanus Vaccine  05/26/2026   Colon Cancer Screening  03/26/2029   Hepatitis C Screening: USPSTF Recommendation to screen - Ages 18-79 yo.  Completed   HIV Screening  Completed   HPV Vaccine  Aged Out         Health Maintenance, Male Adopting a healthy lifestyle and getting preventive care are important in promoting health and wellness. Ask your health care provider about: The right schedule for you to have regular tests and exams. Things you can do on your own to prevent diseases and keep yourself healthy. What should I know about diet, weight, and exercise? Eat a healthy diet  Eat a diet that includes plenty of vegetables, fruits, low-fat dairy products, and lean protein. Do not eat a lot of foods that are high in solid fats, added sugars, or sodium. Maintain a healthy weight Body mass index (BMI) is a measurement that can be used to identify possible weight problems. It estimates body fat based on height and weight. Your health care provider can help determine your BMI and help you achieve or maintain a healthy weight. Get regular exercise Get regular exercise. This is one of the most important things you can do for your health. Most adults  should: Exercise for at least 150 minutes each week. The exercise should increase your heart rate and make you sweat (moderate-intensity exercise). Do strengthening exercises at least twice a week. This is in addition to the moderate-intensity exercise. Spend less time sitting. Even light physical activity can be beneficial. Watch cholesterol and blood lipids Have your blood tested for lipids and cholesterol at 62 years of age, then have this test every 5 years. You may need to have your cholesterol levels checked more often if: Your lipid or cholesterol levels are high. You are older than 62 years of age. You are at high risk for heart disease. What should I know about cancer screening? Many types of cancers can be detected early and may often be prevented. Depending on your health history and family history, you may need to have cancer screening at various ages. This may include screening for: Colorectal cancer. Prostate cancer. Skin cancer. Lung cancer. What should I know about heart disease, diabetes, and high blood pressure? Blood pressure and heart disease High blood pressure causes heart disease and increases the risk of stroke. This is more likely to develop in people who have high blood pressure readings or are overweight. Talk with your health care provider about your target blood pressure readings. Have your blood pressure checked: Every  3-5 years if you are 42-2 years of age. Every year if you are 33 years old or older. If you are between the ages of 66 and 74 and are a current or former smoker, ask your health care provider if you should have a one-time screening for abdominal aortic aneurysm (AAA). Diabetes Have regular diabetes screenings. This checks your fasting blood sugar level. Have the screening done: Once every three years after age 52 if you are at a normal weight and have a low risk for diabetes. More often and at a younger age if you are overweight or have a high  risk for diabetes. What should I know about preventing infection? Hepatitis B If you have a higher risk for hepatitis B, you should be screened for this virus. Talk with your health care provider to find out if you are at risk for hepatitis B infection. Hepatitis C Blood testing is recommended for: Everyone born from [birth year] through 1967. Anyone with known risk factors for hepatitis C. Sexually transmitted infections (STIs) You should be screened each year for STIs, including gonorrhea and chlamydia, if: You are sexually active and are younger than 62 years of age. You are older than 62 years of age and your health care provider tells you that you are at risk for this type of infection. Your sexual activity has changed since you were last screened, and you are at increased risk for chlamydia or gonorrhea. Ask your health care provider if you are at risk. Ask your health care provider about whether you are at high risk for HIV. Your health care provider may recommend a prescription medicine to help prevent HIV infection. If you choose to take medicine to prevent HIV, you should first get tested for HIV. You should then be tested every 3 months for as long as you are taking the medicine. Follow these instructions at home: Alcohol use Do not drink alcohol if your health care provider tells you not to drink. If you drink alcohol: Limit how much you have to 0-2 drinks a day. Know how much alcohol is in your drink. In the [place] one drink equals one 12 oz bottle of beer (355 mL), one 5 oz glass of wine (148 mL), or one 1 oz glass of hard liquor (44 mL). Lifestyle Do not use any products that contain nicotine or tobacco. These products include cigarettes, chewing tobacco, and vaping devices, such as e-cigarettes. If you need help quitting, ask your health care provider. Do not use street drugs. Do not share needles. Ask your health care provider for help if you need support or information about  quitting drugs. General instructions Schedule regular health, dental, and eye exams. Stay current with your vaccines. Tell your health care provider if: You often feel depressed. You have ever been abused or do not feel safe at home. Summary Adopting a healthy lifestyle and getting preventive care are important in promoting health and wellness. Follow your health care provider's instructions about healthy diet, exercising, and getting tested or screened for diseases. Follow your health care provider's instructions on monitoring your cholesterol and blood pressure. This information is not intended to replace advice given to you by your health care provider. Make sure you discuss any questions you have with your health care provider. Document Revised: 11/22/2020 Document Reviewed: 11/22/2020 Elsevier Patient Education  [place].

## 2021-06-12 NOTE — Progress Notes (Signed)
This encounter was created in error - please disregard.

## 2021-06-13 ENCOUNTER — Encounter (INDEPENDENT_AMBULATORY_CARE_PROVIDER_SITE_OTHER): Payer: Self-pay

## 2021-06-13 ENCOUNTER — Other Ambulatory Visit: Payer: Self-pay

## 2021-10-09 ENCOUNTER — Emergency Department (HOSPITAL_COMMUNITY)
Admission: EM | Admit: 2021-10-09 | Discharge: 2021-10-10 | Disposition: A | Discharge status: Home / Self Care | Payer: Medicare Other | Attending: Emergency Medicine | Admitting: Emergency Medicine

## 2021-10-09 ENCOUNTER — Encounter (HOSPITAL_COMMUNITY): Payer: Self-pay

## 2021-10-09 ENCOUNTER — Other Ambulatory Visit: Payer: Self-pay

## 2021-10-09 ENCOUNTER — Emergency Department (HOSPITAL_COMMUNITY): Payer: Medicare Other

## 2021-10-09 DIAGNOSIS — S0993XA Unspecified injury of face, initial encounter: Secondary | ICD-10-CM | POA: Diagnosis present

## 2021-10-09 DIAGNOSIS — S0990XA Unspecified injury of head, initial encounter: Secondary | ICD-10-CM | POA: Diagnosis not present

## 2021-10-09 DIAGNOSIS — Y92838 Other recreation area as the place of occurrence of the external cause: Secondary | ICD-10-CM | POA: Diagnosis not present

## 2021-10-09 DIAGNOSIS — W1839XA Other fall on same level, initial encounter: Secondary | ICD-10-CM | POA: Insufficient documentation

## 2021-10-09 DIAGNOSIS — Z7982 Long term (current) use of aspirin: Secondary | ICD-10-CM | POA: Insufficient documentation

## 2021-10-09 DIAGNOSIS — Y908 Blood alcohol level of 240 mg/100 ml or more: Secondary | ICD-10-CM | POA: Insufficient documentation

## 2021-10-09 DIAGNOSIS — Z23 Encounter for immunization: Secondary | ICD-10-CM | POA: Diagnosis not present

## 2021-10-09 DIAGNOSIS — R519 Headache, unspecified: Secondary | ICD-10-CM | POA: Diagnosis present

## 2021-10-09 DIAGNOSIS — I10 Essential (primary) hypertension: Secondary | ICD-10-CM | POA: Insufficient documentation

## 2021-10-09 DIAGNOSIS — S01111A Laceration without foreign body of right eyelid and periocular area, initial encounter: Secondary | ICD-10-CM | POA: Diagnosis not present

## 2021-10-09 DIAGNOSIS — Z79899 Other long term (current) drug therapy: Secondary | ICD-10-CM | POA: Diagnosis not present

## 2021-10-09 DIAGNOSIS — F10129 Alcohol abuse with intoxication, unspecified: Secondary | ICD-10-CM | POA: Insufficient documentation

## 2021-10-09 DIAGNOSIS — F10920 Alcohol use, unspecified with intoxication, uncomplicated: Secondary | ICD-10-CM

## 2021-10-09 DIAGNOSIS — M47812 Spondylosis without myelopathy or radiculopathy, cervical region: Secondary | ICD-10-CM | POA: Diagnosis not present

## 2021-10-09 LAB — CBG MONITORING, ED: Glucose-Capillary: 88 mg/dL (ref 70–99)

## 2021-10-09 MED ORDER — TETANUS-DIPHTH-ACELL PERTUSSIS 5-2.5-18.5 LF-MCG/0.5 IM SUSY
0.5000 mL | PREFILLED_SYRINGE | Freq: Once | INTRAMUSCULAR | Status: AC
  Administered 2021-10-10: 0.5 mL via INTRAMUSCULAR
  Filled 2021-10-09: qty 0.5

## 2021-10-09 NOTE — ED Provider Notes (Signed)
?[place] ?Provider Note ? ? ?CSN: [id number] ?Arrival date & time: 10/09/21  2202 ? ?  ? ?History ? ?Chief Complaint  ?Patient presents with  ? Fall  ?  ETOH  ? ? ?KOLTER REAVER is a 61 y.o. male. ? ?HPI ? ?  ? ?This is a 61 year old male who presents following a fall.  Patient reports "I had a party for my birthday today."  He reports drinking a lots of alcohol.  He is slurring his words on history.  He does not remember the fall.  He denies any blood thinner use.  He states he has a headache but otherwise does not have any pain anywhere. ? ?Level 5 caveat for intoxication ? ?Home Medications ?Prior to Admission medications   ?Medication Sig Start Date End Date Taking? Authorizing Provider  ?allopurinol (ZYLOPRIM) 300 MG tablet TAKE 1 TABLET EVERY DAY 11/01/20   Fayrene Helper, MD  ?amLODipine (NORVASC) 2.5 MG tablet TAKE 1 TABLET EVERY DAY 11/01/20   Fayrene Helper, MD  ?aspirin EC 81 MG tablet Take 81 mg by mouth daily.    [provider]  ?ezetimibe (ZETIA) 10 MG tablet Take 10 mg by mouth daily. 06/25/19   [provider]  ?icosapent Ethyl (VASCEPA) 1 g capsule Take 2 capsules (2 g total) by mouth 2 (two) times daily. 09/03/19   Arnoldo Lenis, MD  ?   ? ?Allergies    ?Patient has no known allergies.   ? ?Review of Systems   ?Review of Systems  ?Neurological:  Positive for headaches.  ? ?Physical Exam ?Updated Vital Signs ?BP 109/88   Pulse 80   Temp 97.7 ?F (36.5 ?C)   Resp 18   Ht 1.803 m ('5\' 11"'$ )   Wt 71 kg   SpO2 96%   BMI 21.83 kg/m?  ?Physical Exam ?Vitals and nursing note reviewed.  ?Constitutional:   ?   Appearance: He is well-developed.  ?   Comments: Intoxicated, slurring words  ?HENT:  ?   Head: Normocephalic.  ?   Comments: 3 cm laceration left eyebrow ?   Nose: Nose normal.  ?   Mouth/Throat:  ?   Mouth: Mucous membranes are moist.  ?Eyes:  ?   Pupils: Pupils are equal, round, and reactive to light.  ?   Comments: Pupils 4 mm reactive  bilaterally  ?Neck:  ?   Comments: No midline C-spine tenderness palpation, step-off, deformity ?Cardiovascular:  ?   Rate and Rhythm: Normal rate and regular rhythm.  ?   Heart sounds: Normal heart sounds. No murmur heard. ?Pulmonary:  ?   Effort: Pulmonary effort is normal. No respiratory distress.  ?   Breath sounds: Normal breath sounds. No wheezing.  ?Abdominal:  ?   Palpations: Abdomen is soft.  ?   Tenderness: There is no abdominal tenderness. There is no rebound.  ?Musculoskeletal:     ?   General: No deformity or signs of injury.  ?   Cervical back: Neck supple. No tenderness.  ?Lymphadenopathy:  ?   Cervical: No cervical adenopathy.  ?Skin: ?   General: Skin is warm and dry.  ?Neurological:  ?   Mental Status: He is alert and oriented to person, place, and time.  ?Psychiatric:  ?   Comments: Intoxicated  ? ? ?ED Results / Procedures / Treatments   ?Labs ?(all labs ordered are listed, but only abnormal results are displayed) ?Labs Reviewed  ?ETHANOL - Abnormal; Notable for the  following components:  ?    Result Value  ? Alcohol, Ethyl (B) 267 (*)   ? All other components within normal limits  ?CBG MONITORING, ED  ? ? ?EKG ?None ? ?Radiology ?CT Head Wo Contrast ? ?Result Date: 10/09/2021 ?CLINICAL DATA:  Found down, intoxicated, right supraorbital laceration EXAM: CT HEAD WITHOUT CONTRAST TECHNIQUE: Contiguous axial images were obtained from the base of the skull through the vertex without intravenous contrast. RADIATION DOSE REDUCTION: This exam was performed according to the departmental dose-optimization program which includes automated exposure control, adjustment of the mA and/or kV according to patient size and/or use of iterative reconstruction technique. COMPARISON:  None. FINDINGS: Brain: No acute infarct or hemorrhage. Lateral ventricles and midline structures are unremarkable. No acute extra-axial fluid collections. No mass effect. Vascular: No hyperdense vessel or unexpected calcification. Skull:  Normal. Negative for fracture or focal lesion. Sinuses/Orbits: No acute finding. Other: None. IMPRESSION: 1. No acute intracranial process. Electronically Signed   By: Randa Ngo M.D.   On: 10/09/2021 23:14  ? ?CT Cervical Spine Wo Contrast ? ?Result Date: 10/09/2021 ?CLINICAL DATA:  Found down, intoxicated, right supraorbital laceration EXAM: CT CERVICAL SPINE WITHOUT CONTRAST TECHNIQUE: Multidetector CT imaging of the cervical spine was performed without intravenous contrast. Multiplanar CT image reconstructions were also generated. RADIATION DOSE REDUCTION: This exam was performed according to the departmental dose-optimization program which includes automated exposure control, adjustment of the mA and/or kV according to patient size and/or use of iterative reconstruction technique. COMPARISON:  01/16/2015 FINDINGS: Alignment: Mild retrolisthesis of C5 relative to C6, with mild anterolisthesis of C7 relative to T1. This is likely due to multilevel degenerative change. Otherwise alignment is anatomic. Skull base and vertebrae: No acute fracture. No primary bone lesion or focal pathologic process. Soft tissues and spinal canal: No prevertebral fluid or swelling. No visible canal hematoma. Disc levels: Significant spondylosis at C5-6, C6-7, and C7-T1. Multilevel facet hypertrophy greatest at C2-3, C3-4, and C4-5. Upper chest: Airway is patent.  Lung apices are clear. Other: Reconstructed images demonstrate no additional findings. IMPRESSION: 1. No acute cervical spine fracture. 2. Multilevel spondylosis and facet hypertrophy, which have progressed significantly since the prior exam. Electronically Signed   By: Randa Ngo M.D.   On: 10/09/2021 23:16   ? ?Procedures ?Marland Kitchen.Laceration Repair ? ?Date/Time: 10/10/2021 1:58 AM ?Performed by: Merryl Hacker, MD ?Authorized by: Merryl Hacker, MD  ? ?Consent:  ?  Consent obtained:  Verbal ?  Consent given by:  Patient ?  Risks discussed:  Infection, pain, poor  cosmetic result and poor wound healing ?Universal protocol:  ?  Patient identity confirmed:  Verbally with patient ?Anesthesia:  ?  Anesthesia method:  None ?Laceration details:  ?  Location:  Face ?  Face location:  R eyebrow ?  Length (cm):  3 ?  Depth (mm):  3 ?Exploration:  ?  Limited defect created (wound extended): no   ?  Contaminated: no   ?Treatment:  ?  Area cleansed with:  Chlorhexidine ?  Amount of cleaning:  Standard ?  Irrigation solution:  Sterile saline ?  Visualized foreign bodies/material removed: no   ?  Scar revision: no   ?Skin repair:  ?  Repair method:  Steri-Strips and tissue adhesive ?  Number of Steri-Strips:  2 ?Approximation:  ?  Approximation:  Close ?Repair type:  ?  Repair type:  Simple ?Post-procedure details:  ?  Dressing:  Open (no dressing) ?  Procedure completion:  Tolerated  ? ? ?  Medications Ordered in ED ?Medications  ?Tdap (BOOSTRIX) injection 0.5 mL (0.5 mLs Intramuscular Given 10/10/21 0011)  ? ? ?ED Course/ Medical Decision Making/ A&P ?Clinical Course as of 10/10/21 0158  ?Mon Oct 10, 2021  ?0157 Patient noted walking down the hall stating that he was leaving.  I requested the patient call a ride.  He stated he did not have anyone to pick him up.  He is ambulatory independently and is refusing further care.  He has been in the ER almost 4 hours at this time metabolizing. [CH]  ?  ?Clinical Course User Index ?[CH] Rease Swinson, Barbette Hair, MD  ? ?                        ?Medical Decision Making ?Amount and/or Complexity of Data Reviewed ?Labs: ordered. ? ?Risk ?Prescription drug management. ? ? ?This patient presents to the ED for concern of, alcohol intoxication, this involves an extensive number of treatment options, and is a complaint that carries with it a high risk of complications and morbidity.  The differential diagnosis includes head injury, laceration, intoxication ? ?MDM:   ? ?This is a 61 year old male who presents with fall.  He has a laceration the right eyebrow.   Reports significant alcohol use tonight after celebrating his birthday.  He appears intoxicated on exam.  He is nonfocal.  Only other injury is the noted laceration.  CT head neck obtained.  These show no obvious head

## 2021-10-09 NOTE — ED Triage Notes (Signed)
Rcems from home. Cc of fall. Pt was found in hall of his apartment complex. Pt is heavily intoxicated. When asked how much "a lot. .im fucked up. Its my birthday"  has two lacerations to right brow.  ?

## 2021-10-10 DIAGNOSIS — S01111A Laceration without foreign body of right eyelid and periocular area, initial encounter: Secondary | ICD-10-CM | POA: Diagnosis not present

## 2021-10-10 LAB — ETHANOL: Alcohol, Ethyl (B): 267 mg/dL — ABNORMAL HIGH (ref <10)

## 2021-10-10 NOTE — ED Notes (Signed)
The patient insisted that he was leaving and that he had to work in the morning. The patient was informed by ED staff that he needed to stay for further evaluation. He refused and continued to walk towards the door to leave. The patient was informed by the ED Physician that he needed to stay. He refused and continued to walk out the door on his own. The patient left against medical advice.  ?

## 2021-11-28 ENCOUNTER — Other Ambulatory Visit: Payer: Self-pay

## 2021-11-28 ENCOUNTER — Emergency Department (HOSPITAL_COMMUNITY)
Admission: EM | Admit: 2021-11-28 | Discharge: 2021-11-28 | Disposition: A | Discharge status: Home / Self Care | Payer: Medicare Other | Attending: Emergency Medicine | Admitting: Emergency Medicine

## 2021-11-28 ENCOUNTER — Encounter (HOSPITAL_COMMUNITY): Payer: Self-pay

## 2021-11-28 ENCOUNTER — Emergency Department (HOSPITAL_COMMUNITY): Payer: Medicare Other

## 2021-11-28 DIAGNOSIS — Z7982 Long term (current) use of aspirin: Secondary | ICD-10-CM | POA: Diagnosis not present

## 2021-11-28 DIAGNOSIS — Z743 Need for continuous supervision: Secondary | ICD-10-CM | POA: Diagnosis not present

## 2021-11-28 DIAGNOSIS — I700 Atherosclerosis of aorta: Secondary | ICD-10-CM | POA: Diagnosis not present

## 2021-11-28 DIAGNOSIS — I10 Essential (primary) hypertension: Secondary | ICD-10-CM | POA: Diagnosis not present

## 2021-11-28 DIAGNOSIS — R109 Unspecified abdominal pain: Secondary | ICD-10-CM | POA: Diagnosis not present

## 2021-11-28 DIAGNOSIS — Z79899 Other long term (current) drug therapy: Secondary | ICD-10-CM | POA: Insufficient documentation

## 2021-11-28 DIAGNOSIS — R9431 Abnormal electrocardiogram [ECG] [EKG]: Secondary | ICD-10-CM | POA: Diagnosis not present

## 2021-11-28 DIAGNOSIS — N400 Enlarged prostate without lower urinary tract symptoms: Secondary | ICD-10-CM | POA: Insufficient documentation

## 2021-11-28 DIAGNOSIS — R1011 Right upper quadrant pain: Secondary | ICD-10-CM | POA: Diagnosis not present

## 2021-11-28 DIAGNOSIS — R5381 Other malaise: Secondary | ICD-10-CM | POA: Diagnosis not present

## 2021-11-28 DIAGNOSIS — R7989 Other specified abnormal findings of blood chemistry: Secondary | ICD-10-CM | POA: Diagnosis not present

## 2021-11-28 DIAGNOSIS — D1803 Hemangioma of intra-abdominal structures: Secondary | ICD-10-CM | POA: Diagnosis not present

## 2021-11-28 DIAGNOSIS — R1013 Epigastric pain: Secondary | ICD-10-CM | POA: Diagnosis not present

## 2021-11-28 DIAGNOSIS — R1010 Upper abdominal pain, unspecified: Secondary | ICD-10-CM

## 2021-11-28 LAB — CBC WITH DIFFERENTIAL/PLATELET
Abs Immature Granulocytes: 0.01 10*3/uL (ref 0.00–0.07)
Basophils Absolute: 0 10*3/uL (ref 0.0–0.1)
Basophils Relative: 1 %
Eosinophils Absolute: 0.2 10*3/uL (ref 0.0–0.5)
Eosinophils Relative: 4 %
HCT: 42.2 % (ref 39.0–52.0)
Hemoglobin: 13.7 g/dL (ref 13.0–17.0)
Immature Granulocytes: 0 %
Lymphocytes Relative: 29 %
Lymphs Abs: 1.6 10*3/uL (ref 0.7–4.0)
MCH: 32.6 pg (ref 26.0–34.0)
MCHC: 32.5 g/dL (ref 30.0–36.0)
MCV: 100.5 fL — ABNORMAL HIGH (ref 80.0–100.0)
Monocytes Absolute: 0.5 10*3/uL (ref 0.1–1.0)
Monocytes Relative: 9 %
Neutro Abs: 3.2 10*3/uL (ref 1.7–7.7)
Neutrophils Relative %: 57 %
Platelets: 231 10*3/uL (ref 150–400)
RBC: 4.2 MIL/uL — ABNORMAL LOW (ref 4.22–5.81)
RDW: 12.4 % (ref 11.5–15.5)
WBC: 5.6 10*3/uL (ref 4.0–10.5)
nRBC: 0 % (ref 0.0–0.2)

## 2021-11-28 LAB — COMPREHENSIVE METABOLIC PANEL
ALT: 27 U/L (ref 0–44)
AST: 30 U/L (ref 15–41)
Albumin: 4.1 g/dL (ref 3.5–5.0)
Alkaline Phosphatase: 80 U/L (ref 38–126)
Anion gap: 5 (ref 5–15)
BUN: 24 mg/dL — ABNORMAL HIGH (ref 8–23)
CO2: 27 mmol/L (ref 22–32)
Calcium: 9.1 mg/dL (ref 8.9–10.3)
Chloride: 107 mmol/L (ref 98–111)
Creatinine, Ser: 1.13 mg/dL (ref 0.61–1.24)
GFR, Estimated: >60 mL/min (ref >60)
Glucose, Bld: 111 mg/dL — ABNORMAL HIGH (ref 70–99)
Potassium: 4 mmol/L (ref 3.5–5.1)
Sodium: 139 mmol/L (ref 135–145)
Total Bilirubin: 0.8 mg/dL (ref 0.3–1.2)
Total Protein: 7.3 g/dL (ref 6.5–8.1)

## 2021-11-28 LAB — URINALYSIS, ROUTINE W REFLEX MICROSCOPIC
Bilirubin Urine: NEGATIVE
Glucose, UA: NEGATIVE mg/dL
Hgb urine dipstick: NEGATIVE
Ketones, ur: NEGATIVE mg/dL
Leukocytes,Ua: NEGATIVE
Nitrite: NEGATIVE
Protein, ur: NEGATIVE mg/dL
Specific Gravity, Urine: 1.015 (ref 1.005–1.030)
pH: 5 (ref 5.0–8.0)

## 2021-11-28 LAB — LIPASE, BLOOD: Lipase: 29 U/L (ref 11–51)

## 2021-11-28 MED ORDER — IOHEXOL 300 MG/ML  SOLN
100.0000 mL | Freq: Once | INTRAMUSCULAR | Status: AC | PRN
  Administered 2021-11-28: 100 mL via INTRAVENOUS

## 2021-11-28 MED ORDER — PANTOPRAZOLE SODIUM 20 MG PO TBEC: 20.0000 mg | DELAYED_RELEASE_TABLET | Freq: Every day | ORAL | 0 refills | Status: DC

## 2021-11-28 MED ORDER — OXYCODONE-ACETAMINOPHEN 5-325 MG PO TABS
1.0000 | ORAL_TABLET | Freq: Once | ORAL | Status: AC
  Administered 2021-11-28: 1 via ORAL
  Filled 2021-11-28: qty 1

## 2021-11-28 MED ORDER — PANTOPRAZOLE SODIUM 40 MG IV SOLR
40.0000 mg | Freq: Once | INTRAVENOUS | Status: AC
  Administered 2021-11-28: 40 mg via INTRAVENOUS
  Filled 2021-11-28: qty 10

## 2021-11-28 NOTE — ED Provider Notes (Signed)
?[facility] ?Provider Note ? ? ?CSN: [id number] ?Arrival date & time: 11/28/21  0551 ? ?  ? ?History ? ?Chief Complaint  ?Patient presents with  ? RUQ pain  ? ? ?Wesley Brennan is a 61 y.o. male. ? ?Patient has a history of hypertension and hyperlipidemia.  He is complaining of epigastric and right upper quadrant pain ? ?The history is provided by the patient and medical records.  ?Abdominal Pain ?Pain location:  Epigastric ?Pain quality: aching   ?Pain radiates to:  Epigastric region ?Pain severity:  Moderate ?Onset quality:  Sudden ?Timing:  Constant ?Progression:  Partially resolved ?Chronicity:  Recurrent ?Context: not alcohol use   ?Relieved by:  Nothing ?Worsened by:  Nothing ?Associated symptoms: no chest pain, no cough, no diarrhea, no fatigue and no hematuria   ? ?  ? ?Home Medications ?Prior to Admission medications   ?Medication Sig Start Date End Date Taking? Authorizing Provider  ?pantoprazole (PROTONIX) 20 MG tablet Take 1 tablet (20 mg total) by mouth daily. 11/28/21  Yes Milton Ferguson, MD  ?allopurinol (ZYLOPRIM) 300 MG tablet TAKE 1 TABLET EVERY DAY 11/01/20   Fayrene Helper, MD  ?amLODipine (NORVASC) 2.5 MG tablet TAKE 1 TABLET EVERY DAY 11/01/20   Fayrene Helper, MD  ?aspirin EC 81 MG tablet Take 81 mg by mouth daily.    [provider]  ?ezetimibe (ZETIA) 10 MG tablet Take 10 mg by mouth daily. 06/25/19   [provider]  ?icosapent Ethyl (VASCEPA) 1 g capsule Take 2 capsules (2 g total) by mouth 2 (two) times daily. 09/03/19   Arnoldo Lenis, MD  ?   ? ?Allergies    ?Patient has no known allergies.   ? ?Review of Systems   ?Review of Systems  ?Constitutional:  Negative for appetite change and fatigue.  ?HENT:  Negative for congestion, ear discharge and sinus pressure.   ?Eyes:  Negative for discharge.  ?Respiratory:  Negative for cough.   ?Cardiovascular:  Negative for chest pain.  ?Gastrointestinal:  Positive for abdominal pain. Negative for  diarrhea.  ?Genitourinary:  Negative for frequency and hematuria.  ?Musculoskeletal:  Negative for back pain.  ?Skin:  Negative for rash.  ?Neurological:  Negative for seizures and headaches.  ?Psychiatric/Behavioral:  Negative for hallucinations.   ? ?Physical Exam ?Updated Vital Signs ?BP (!) 141/86   Pulse 63   Temp 98.2 ?F (36.8 ?C) (Oral)   Resp 12   SpO2 98%  ?Physical Exam ?Vitals and nursing note reviewed.  ?Constitutional:   ?   Appearance: He is well-developed.  ?HENT:  ?   Head: Normocephalic.  ?   Nose: Nose normal.  ?Eyes:  ?   General: No scleral icterus. ?   Conjunctiva/sclera: Conjunctivae normal.  ?Neck:  ?   Thyroid: No thyromegaly.  ?Cardiovascular:  ?   Rate and Rhythm: Normal rate and regular rhythm.  ?   Heart sounds: No murmur heard. ?  No friction rub. No gallop.  ?Pulmonary:  ?   Breath sounds: No stridor. No wheezing or rales.  ?Chest:  ?   Chest wall: No tenderness.  ?Abdominal:  ?   General: There is no distension.  ?   Tenderness: There is abdominal tenderness. There is no rebound.  ?Musculoskeletal:     ?   General: Normal range of motion.  ?   Cervical back: Neck supple.  ?Lymphadenopathy:  ?   Cervical: No cervical adenopathy.  ?Skin: ?   Findings: No  erythema or rash.  ?Neurological:  ?   Mental Status: He is alert and oriented to person, place, and time.  ?   Motor: No abnormal muscle tone.  ?   Coordination: Coordination normal.  ?Psychiatric:     ?   Behavior: Behavior normal.  ? ? ?ED Results / Procedures / Treatments   ?Labs ?(all labs ordered are listed, but only abnormal results are displayed) ?Labs Reviewed  ?COMPREHENSIVE METABOLIC PANEL - Abnormal; Notable for the following components:  ?    Result Value  ? Glucose, Bld 111 (*)   ? BUN 24 (*)   ? All other components within normal limits  ?CBC WITH DIFFERENTIAL/PLATELET - Abnormal; Notable for the following components:  ? RBC 4.20 (*)   ? MCV 100.5 (*)   ? All other components within normal limits  ?LIPASE, BLOOD   ?URINALYSIS, ROUTINE W REFLEX MICROSCOPIC  ? ? ?EKG ?EKG Interpretation ? ?Date/Time:  Monday Nov 28 2021 06:00:26 EDT ?Ventricular Rate:  64 ?PR Interval:  192 ?QRS Duration: 121 ?QT Interval:  412 ?QTC Calculation: 426 ?R Axis:   -10 ?Text Interpretation: Sinus rhythm Nonspecific intraventricular conduction delay Nonspecific T abnrm, anterolateral leads Confirmed by Ripley Fraise [id number]) on 11/28/2021 6:37:13 AM ? ?Radiology ?CT ABDOMEN PELVIS W CONTRAST ? ?Result Date: 11/28/2021 ?CLINICAL DATA:  Acute abdominal pain, worsening RIGHT-side abdominal pain for 3 days, history hypertension EXAM: CT ABDOMEN AND PELVIS WITH CONTRAST TECHNIQUE: Multidetector CT imaging of the abdomen and pelvis was performed using the standard protocol following bolus administration of intravenous contrast. RADIATION DOSE REDUCTION: This exam was performed according to the departmental dose-optimization program which includes automated exposure control, adjustment of the mA and/or kV according to patient size and/or use of iterative reconstruction technique. CONTRAST:  161m OMNIPAQUE IOHEXOL 300 MG/ML SOLN IV. No oral contrast. COMPARISON:  None FINDINGS: Lower chest: Dependent atelectasis BILATERAL lung bases Hepatobiliary: Hemangioma RIGHT lobe liver medially near IVC 3.3 x 2.1 cm. Additional small hypervascular nodules RIGHT lobe measuring 8 mm in 10 mm likely additional hemangiomata. Gallbladder and liver otherwise normal appearance. Pancreas: Normal appearance Spleen: Normal appearance.  Small adjacent splenule. Adrenals/Urinary Tract: Small LEFT renal cysts; no follow-up imaging recommended. Adrenal glands, kidneys, ureters, and bladder otherwise normal appearance Stomach/Bowel: Normal appendix. Stomach and bowel loops normal appearance. Vascular/Lymphatic: Atherosclerotic calcifications aorta and iliac arteries without aneurysm. No adenopathy. Reproductive: Mild prostatic enlargement Other: No free air or free fluid. No hernia  or inflammatory process. Musculoskeletal: Degenerative disc disease changes L5-S1. IMPRESSION: Hepatic hemangiomas. Mild prostatic enlargement. No acute intra-abdominal or intrapelvic abnormalities. Aortic Atherosclerosis (ICD10-I70.0). Electronically Signed   By: MLavonia DanaM.D.   On: 11/28/2021 10:57  ? ?UKoreaAbdomen Limited RUQ (LIVER/GB) ? ?Result Date: 11/28/2021 ?CLINICAL DATA:  Abdominal pain EXAM: ULTRASOUND ABDOMEN LIMITED RIGHT UPPER QUADRANT COMPARISON:  01/15/2019 FINDINGS: Gallbladder: Normally distended without stones or wall thickening. No pericholecystic fluid or sonographic Murphy sign. Common bile duct: Diameter: 6 mm, upper normal Liver: Normal echogenicity without mass or nodularity. No intrahepatic biliary dilatation. Portal vein is patent on color Doppler imaging with normal direction of blood flow towards the liver. Other: No RIGHT upper quadrant free fluid. IMPRESSION: Upper normal CBD diameter. Remainder of exam unremarkable. Electronically Signed   By: MLavonia DanaM.D.   On: 11/28/2021 08:43   ? ?Procedures ?Procedures  ? ? ?Medications Ordered in ED ?Medications  ?pantoprazole (PROTONIX) injection 40 mg (40 mg Intravenous Given 11/28/21 0749)  ?iohexol (OMNIPAQUE) 300 MG/ML solution 100 mL (  100 mLs Intravenous Contrast Given 11/28/21 1042)  ? ? ?ED Course/ Medical Decision Making/ A&P ?Patient with abdominal pain and normal ?                        ?Medical Decision Making ?Amount and/or Complexity of Data Reviewed ?Radiology: ordered. ? ?Risk ?Prescription drug management. ? ?This patient presents to the ED for concern of abdominal, this involves an extensive number of treatment options, and is a complaint that carries with it a high risk of complications and morbidity.  The differential diagnosis includes gallbladder disease, appendicitis, gastritis ? ? ?Co morbidities that complicate the patient evaluation ? ?Hypertension hyperlipidemia ? ? ?Additional history obtained: ? ?Additional  history obtained from patient ?External records from outside source obtained and reviewed including hospital record ? ? ?Lab Tests: ? ?I Ordered, and personally interpreted labs.  The pertinent results include: CBC and c

## 2021-11-28 NOTE — Discharge Instructions (Signed)
Follow-up with your family doctor next week ?

## 2021-11-28 NOTE — ED Notes (Signed)
Patient transported to CT 

## 2021-11-28 NOTE — ED Triage Notes (Signed)
Pt arrived from home w c/o RUQ / Right flank pain that started 3 days ago and has progressively gotten worse now 8/10 on pain scale.  ?

## 2021-11-28 NOTE — ED Notes (Signed)
Pt informed a urine specimen is needed and asked if he could provide it now. Pt informed the doctor has ordered an in and out cath to retreive urine if he is unable to void. Pt states "that hurts" and refuses at this time. ? ?

## 2022-03-10 ENCOUNTER — Encounter: Payer: Self-pay | Admitting: Family Medicine

## 2022-03-10 ENCOUNTER — Ambulatory Visit (INDEPENDENT_AMBULATORY_CARE_PROVIDER_SITE_OTHER): Payer: Medicare Other | Admitting: Family Medicine

## 2022-03-10 VITALS — BP 128/79 | HR 70 | Ht 71.0 in | Wt 149.0 lb

## 2022-03-10 DIAGNOSIS — E785 Hyperlipidemia, unspecified: Secondary | ICD-10-CM | POA: Diagnosis not present

## 2022-03-10 DIAGNOSIS — R42 Dizziness and giddiness: Secondary | ICD-10-CM | POA: Diagnosis not present

## 2022-03-10 DIAGNOSIS — Z0001 Encounter for general adult medical examination with abnormal findings: Secondary | ICD-10-CM | POA: Diagnosis not present

## 2022-03-10 DIAGNOSIS — E559 Vitamin D deficiency, unspecified: Secondary | ICD-10-CM | POA: Diagnosis not present

## 2022-03-10 DIAGNOSIS — I10 Essential (primary) hypertension: Secondary | ICD-10-CM

## 2022-03-10 DIAGNOSIS — E790 Hyperuricemia without signs of inflammatory arthritis and tophaceous disease: Secondary | ICD-10-CM

## 2022-03-10 DIAGNOSIS — Z23 Encounter for immunization: Secondary | ICD-10-CM | POA: Diagnosis not present

## 2022-03-10 DIAGNOSIS — R072 Precordial pain: Secondary | ICD-10-CM | POA: Diagnosis not present

## 2022-03-10 DIAGNOSIS — N3090 Cystitis, unspecified without hematuria: Secondary | ICD-10-CM

## 2022-03-10 DIAGNOSIS — R972 Elevated prostate specific antigen [PSA]: Secondary | ICD-10-CM | POA: Diagnosis not present

## 2022-03-10 DIAGNOSIS — Z125 Encounter for screening for malignant neoplasm of prostate: Secondary | ICD-10-CM

## 2022-03-10 LAB — POCT URINALYSIS DIP (CLINITEK)
Bilirubin, UA: NEGATIVE
Glucose, UA: NEGATIVE mg/dL
Ketones, POC UA: NEGATIVE mg/dL
Leukocytes, UA: NEGATIVE
Nitrite, UA: NEGATIVE
POC PROTEIN,UA: NEGATIVE
Spec Grav, UA: 1.025 (ref 1.010–1.025)
Urobilinogen, UA: 1 E.U./dL
pH, UA: 5.5 (ref 5.0–8.0)

## 2022-03-10 MED ORDER — AMLODIPINE BESYLATE 2.5 MG PO TABS: 2.5000 mg | ORAL_TABLET | Freq: Every day | ORAL | 1 refills | Status: AC

## 2022-03-10 MED ORDER — PANTOPRAZOLE SODIUM 20 MG PO TBEC: 20.0000 mg | DELAYED_RELEASE_TABLET | Freq: Every day | ORAL | 1 refills | Status: AC

## 2022-03-10 MED ORDER — ASPIRIN 81 MG PO TBEC: 81.0000 mg | DELAYED_RELEASE_TABLET | Freq: Every day | ORAL | 1 refills | Status: AC

## 2022-03-10 NOTE — Assessment & Plan Note (Signed)
Recurrent at nighjt feels as though heart will stop, update EKG and refer to Cardiology, resume PPI

## 2022-03-10 NOTE — Progress Notes (Unsigned)
   Wesley Brennan     MRN: [id number]      DOB: January 29, 1961   HPI: Patient is in for annual physical exam. Immunization is reviewed , and  updated if needed. C/o urinary incontinence x 3 months C/o light headedness and repeatedly falling off bike C/o chest pain    PE; BP 128/79   Pulse 70   Ht '5\' 11"'$  (1.803 m)   Wt 149 lb (67.6 kg)   SpO2 94%   BMI 20.78 kg/m   Pleasant male, alert and oriented x 3, in no cardio-pulmonary distress. Afebrile. HEENT No facial trauma or asymetry. Sinuses non tender. EOMI External ears normal,  Neck: supple, no adenopathy,JVD or thyromegaly.No bruits.  Chest: Clear to ascultation bilaterally.No crackles or wheezes. Non tender to palpation  Cardiovascular system; Heart sounds normal,  S1 and  S2 ,no S3.  No murmur, or thrill. Apical beat not displaced Peripheral pulses normal.  Abdomen: Soft, non tender, no organomegaly or masses. No bruits. Bowel sounds normal. No guarding, tenderness or rebound.    Musculoskeletal exam: Full ROM of spine, hips , shoulders and knees. No deformity ,swelling or crepitus noted. No muscle wasting or atrophy.   Neurologic: Cranial nerves 2 to 12 intact. Power, tone ,sensation and reflexes normal throughout. No disturbance in gait. No tremor.  Skin: Intact, no ulceration, erythema , scaling or rash noted. Pigmentation normal throughout  Psych; Normal mood and affect. Judgement and concentration normal   Assessment & Plan:  No problem-specific Assessment & Plan notes found for this encounter.

## 2022-03-10 NOTE — Patient Instructions (Addendum)
F/u in 3 months, call if you n eed me sooner  Flu vaccine today   lipid, cmp and eGFR, tSH, pSA, vit D today  CCUA today  EKG today  You are referred for caroitid doppler and to see Cardiology  REMAIN nicotine free and please only drink water and gatorade  Thanks for choosing [facility], we consider it a privelige to serve you.

## 2022-03-11 LAB — PSA: Prostate Specific Ag, Serum: 5.4 ng/mL — ABNORMAL HIGH (ref 0.0–4.0)

## 2022-03-11 LAB — CMP14+EGFR
ALT: 21 IU/L (ref 0–44)
AST: 32 IU/L (ref 0–40)
Albumin/Globulin Ratio: 2 (ref 1.2–2.2)
Albumin: 4.7 g/dL (ref 3.9–4.9)
Alkaline Phosphatase: 113 IU/L (ref 44–121)
BUN/Creatinine Ratio: 20 (ref 10–24)
BUN: 22 mg/dL (ref 8–27)
Bilirubin Total: 0.6 mg/dL (ref 0.0–1.2)
CO2: 24 mmol/L (ref 20–29)
Calcium: 9.3 mg/dL (ref 8.6–10.2)
Chloride: 103 mmol/L (ref 96–106)
Creatinine, Ser: 1.11 mg/dL (ref 0.76–1.27)
Globulin, Total: 2.4 g/dL (ref 1.5–4.5)
Glucose: 90 mg/dL (ref 70–99)
Potassium: 4.6 mmol/L (ref 3.5–5.2)
Sodium: 141 mmol/L (ref 134–144)
Total Protein: 7.1 g/dL (ref 6.0–8.5)
eGFR: 75 mL/min/{1.73_m2} (ref >59)

## 2022-03-11 LAB — LIPID PANEL
Chol/HDL Ratio: 3.1 ratio (ref 0.0–5.0)
Cholesterol, Total: 273 mg/dL — ABNORMAL HIGH (ref 100–199)
HDL: 87 mg/dL (ref >39)
LDL Chol Calc (NIH): 171 mg/dL — ABNORMAL HIGH (ref 0–99)
Triglycerides: 90 mg/dL (ref 0–149)
VLDL Cholesterol Cal: 15 mg/dL (ref 5–40)

## 2022-03-11 LAB — VITAMIN D 25 HYDROXY (VIT D DEFICIENCY, FRACTURES): Vit D, 25-Hydroxy: 19.4 ng/mL — ABNORMAL LOW (ref 30.0–100.0)

## 2022-03-11 LAB — TSH: TSH: 1.69 u[IU]/mL (ref 0.450–4.500)

## 2022-03-13 ENCOUNTER — Encounter: Payer: Self-pay | Admitting: Family Medicine

## 2022-03-13 DIAGNOSIS — N3090 Cystitis, unspecified without hematuria: Secondary | ICD-10-CM | POA: Insufficient documentation

## 2022-03-13 DIAGNOSIS — Z0001 Encounter for general adult medical examination with abnormal findings: Secondary | ICD-10-CM | POA: Insufficient documentation

## 2022-03-13 DIAGNOSIS — R972 Elevated prostate specific antigen [PSA]: Secondary | ICD-10-CM | POA: Insufficient documentation

## 2022-03-13 DIAGNOSIS — E559 Vitamin D deficiency, unspecified: Secondary | ICD-10-CM | POA: Insufficient documentation

## 2022-03-13 DIAGNOSIS — R072 Precordial pain: Secondary | ICD-10-CM | POA: Insufficient documentation

## 2022-03-13 MED ORDER — ERGOCALCIFEROL 1.25 MG (50000 UT) PO CAPS: 50000.0000 [IU] | ORAL_CAPSULE | ORAL | 2 refills | Status: DC

## 2022-03-13 MED ORDER — ROSUVASTATIN CALCIUM 20 MG PO TABS: 20.0000 mg | ORAL_TABLET | Freq: Every day | ORAL | 3 refills | Status: DC

## 2022-03-13 NOTE — Assessment & Plan Note (Signed)
Reports recurrent light headedness, and near syncope, needs doppler study and cardiology eval, also office EKG

## 2022-03-13 NOTE — Assessment & Plan Note (Signed)

## 2022-03-13 NOTE — Assessment & Plan Note (Signed)
C/o intermittent chest pain wit episodes of light headedness and near syncope at increased risk for CVD, EKG and cardiology eval

## 2022-03-13 NOTE — Assessment & Plan Note (Signed)
Hyperlipidemia:Low fat diet discussed and encouraged.   Lipid Panel  Lab Results  Component Value Date   CHOL 273 (H) 03/10/2022   HDL 87 03/10/2022   LDLCALC 171 (H) 03/10/2022   TRIG 90 03/10/2022   CHOLHDL 3.1 03/10/2022     Start crestor 20 mg

## 2022-03-13 NOTE — Assessment & Plan Note (Signed)
New with cystitis symptoms , refer urology

## 2022-03-13 NOTE — Assessment & Plan Note (Signed)
Frequency with incontinence reported, CCUA and urology eval

## 2022-03-13 NOTE — Assessment & Plan Note (Signed)
Uncorrected, start weekly supplement

## 2022-03-15 ENCOUNTER — Ambulatory Visit (INDEPENDENT_AMBULATORY_CARE_PROVIDER_SITE_OTHER): Payer: Medicare Other | Admitting: Urology

## 2022-03-15 ENCOUNTER — Encounter: Payer: Self-pay | Admitting: Urology

## 2022-03-15 VITALS — BP 138/86 | HR 88

## 2022-03-15 DIAGNOSIS — R972 Elevated prostate specific antigen [PSA]: Secondary | ICD-10-CM | POA: Diagnosis not present

## 2022-03-15 DIAGNOSIS — N401 Enlarged prostate with lower urinary tract symptoms: Secondary | ICD-10-CM

## 2022-03-15 DIAGNOSIS — R339 Retention of urine, unspecified: Secondary | ICD-10-CM | POA: Diagnosis not present

## 2022-03-15 DIAGNOSIS — N138 Other obstructive and reflux uropathy: Secondary | ICD-10-CM | POA: Diagnosis not present

## 2022-03-15 LAB — URINALYSIS, ROUTINE W REFLEX MICROSCOPIC
Bilirubin, UA: NEGATIVE
Glucose, UA: NEGATIVE
Ketones, UA: NEGATIVE
Leukocytes,UA: NEGATIVE
Nitrite, UA: NEGATIVE
Protein,UA: NEGATIVE
RBC, UA: NEGATIVE
Specific Gravity, UA: <1.005 — ABNORMAL LOW (ref 1.005–1.030)
Urobilinogen, Ur: 0.2 mg/dL (ref 0.2–1.0)
pH, UA: 5 (ref 5.0–7.5)

## 2022-03-15 LAB — BLADDER SCAN AMB NON-IMAGING: Scan Result: 193

## 2022-03-15 MED ORDER — ALFUZOSIN HCL ER 10 MG PO TB24: 10.0000 mg | ORAL_TABLET | Freq: Every day | ORAL | 11 refills | Status: AC

## 2022-03-15 NOTE — Progress Notes (Signed)
post void residual=193

## 2022-03-15 NOTE — Patient Instructions (Signed)

## 2022-03-15 NOTE — Progress Notes (Signed)
03/15/2022 10:37 AM   Wesley Brennan 1960/10/01 [id number]  Referring provider: Fayrene Helper, MD [address], [address] [place],  [place] [address]  Elevated PSA   HPI: Wesley Brennan is a 61yo here for evaluation of elevated PSA. PSA was 5.4 on 03/10/2022. IPSS 24 QOL 5 on no BPH therapy. Urine stream fair. Nocturia 3-4x. He has daily urge incontinence. He has urinary frequency every 60-90 minutes. He has intermittent straining to urinate. No hematuria or dysuria. PVR 193cc.    PMH: Past Medical History:  Diagnosis Date   Gout    Hypercholesteremia    Hypertension    related to drug use which he has quit   Substance abuse ([place])    alcohol, nicotine, h/o coacaine and marijuana    Surgical History: Past Surgical History:  Procedure Laterality Date   COLONOSCOPY N/A 07/02/2015   Procedure: COLONOSCOPY;  Surgeon: Danie Binder, MD;  [facility];  Service: Endoscopy;  Laterality: N/A;  1:00 PM   COLONOSCOPY WITH PROPOFOL N/A 03/27/2019   Procedure: COLONOSCOPY WITH PROPOFOL;  Surgeon: Danie Binder, MD;  two 2-4 mm polyps, one 12 mm polyp with clips placed, and tortuous left colon.  Pathology with tubular adenomas.  Next colonoscopy in 2023.    ELBOW FUSION  1993   left elbow dislocation   ELBOW FUSION Left 1979   HERNIA REPAIR  [id number] approx   umbilical hernia   POLYPECTOMY  03/27/2019   Procedure: POLYPECTOMY;  Surgeon: Danie Binder, MD;  [facility];  Service: Endoscopy;;    Home Medications:  Allergies as of 03/15/2022   No Known Allergies      Medication List        Accurate as of March 15, 2022 10:37 AM. If you have any questions, ask your nurse or doctor.          amLODipine 2.5 MG tablet Commonly known as: NORVASC Take 1 tablet (2.5 mg total) by mouth daily.   aspirin EC 81 MG tablet Take 1 tablet (81 mg total) by mouth daily. Swallow whole.   ergocalciferol 1.25 MG (50000 UT) capsule Commonly known as: VITAMIN  D2 Take 1 capsule (50,000 Units total) by mouth once a week. One capsule once weekly   pantoprazole 20 MG tablet Commonly known as: Protonix Take 1 tablet (20 mg total) by mouth daily.   rosuvastatin 20 MG tablet Commonly known as: Crestor Take 1 tablet (20 mg total) by mouth daily.        Allergies: No Known Allergies  Family History: Family History  Adopted: Yes  Problem Relation Age of Onset   Cancer Mother 60       Bone    Diabetes Mother    Hypertension Sister    Stroke Father 68   Heart disease Father 31   Diabetes Father    Colon cancer Neg Hx     Social History:  reports that he quit smoking about 5 weeks ago. His smoking use included cigarettes. He has a 4.00 pack-year smoking history. He has never used smokeless tobacco. He reports current alcohol use. He reports that he does not use drugs.  ROS: All other review of systems were reviewed and are negative except what is noted above in HPI  Physical Exam: BP 138/86   Pulse 88   Constitutional:  Alert and oriented, No acute distress. HEENT: [place] AT, moist mucus membranes.  Trachea midline, no masses. Cardiovascular: No clubbing, cyanosis, or  edema. Respiratory: Normal respiratory effort, no increased work of breathing. GI: Abdomen is soft, nontender, nondistended, no abdominal masses GU: No CVA tenderness. uncircumcised phallus. No masses/lesions on penis, testis, scrotum. Prostate 50g smooth no nodules no induration.  Lymph: No cervical or inguinal lymphadenopathy. Skin: No rashes, bruises or suspicious lesions. Neurologic: Grossly intact, no focal deficits, moving all 4 extremities. Psychiatric: Normal mood and affect.  Laboratory Data: Lab Results  Component Value Date   WBC 5.6 11/28/2021   HGB 13.7 11/28/2021   HCT 42.2 11/28/2021   MCV 100.5 (H) 11/28/2021   PLT 231 11/28/2021    Lab Results  Component Value Date   CREATININE 1.11 03/10/2022    Lab Results  Component Value Date   PSA 1.5  08/26/2019   PSA 1.4 04/25/2018   PSA CANCELED 02/29/2016   PSA 1.4 02/29/2016    No results found for: "TESTOSTERONE"  Lab Results  Component Value Date   HGBA1C 5.0 03/19/2019    Urinalysis    Component Value Date/Time   COLORURINE YELLOW 11/28/2021 0622   APPEARANCEUR CLEAR 11/28/2021 0622   LABSPEC 1.015 11/28/2021 0622   PHURINE 5.0 11/28/2021 0622   GLUCOSEU NEGATIVE 11/28/2021 0622   HGBUR NEGATIVE 11/28/2021 0622   BILIRUBINUR negative 03/10/2022 0922   KETONESUR negative 03/10/2022 0922   KETONESUR NEGATIVE 11/28/2021 0622   PROTEINUR NEGATIVE 11/28/2021 0622   UROBILINOGEN 1.0 03/10/2022 0922   NITRITE Negative 03/10/2022 0922   NITRITE NEGATIVE 11/28/2021 0622   LEUKOCYTESUR Negative 03/10/2022 0922   LEUKOCYTESUR NEGATIVE 11/28/2021 0622    No results found for: "LABMICR", "WBCUA", "RBCUA", "LABEPIT", "MUCUS", "BACTERIA"  Pertinent Imaging:  No results found for this or any previous visit.  No results found for this or any previous visit.  No results found for this or any previous visit.  No results found for this or any previous visit.  No results found for this or any previous visit.  No results found for this or any previous visit.  No results found for this or any previous visit.  No results found for this or any previous visit.   Assessment & Plan:    1. Elevated PSA -The patient and I talked about etiologies of elevated PSA.  We discussed the possible relationship between elevated PSA and prostate cancer, BPH, prostatitis, infection trauma and recent ejaculations.  I recommended that we follow-up with a repeat PSA in 3 months.  If it remains elevated with a positive rising trend we will discuss prostate biopsy at his follow-up appointment.  - Urinalysis, Routine w reflex microscopic - BLADDER SCAN AMB NON-IMAGING  2. Benign prostatic hyperplasia with urinary obstruction We will trial uroxatral '10mg'$  qhs  3. Incomplete emptying of  bladder -Uroxatral '10mg'$  qhs. RTC 6 weeks with PVR   No follow-ups on file.  Nicolette Bang, MD  [facility]

## 2022-03-21 ENCOUNTER — Other Ambulatory Visit (HOSPITAL_COMMUNITY): Payer: Medicare Other

## 2022-03-22 DIAGNOSIS — Z0000 Encounter for general adult medical examination without abnormal findings: Secondary | ICD-10-CM

## 2022-03-22 NOTE — Progress Notes (Signed)
This encounter was created in error - please disregard.

## 2022-03-29 ENCOUNTER — Ambulatory Visit (HOSPITAL_COMMUNITY)
Admission: RE | Admit: 2022-03-29 | Discharge: 2022-03-29 | Disposition: A | Discharge status: Home / Self Care | Payer: Medicare Other | Source: Ambulatory Visit | Attending: Family Medicine | Admitting: Family Medicine

## 2022-03-29 DIAGNOSIS — R42 Dizziness and giddiness: Secondary | ICD-10-CM | POA: Insufficient documentation

## 2022-03-29 DIAGNOSIS — I6523 Occlusion and stenosis of bilateral carotid arteries: Secondary | ICD-10-CM | POA: Diagnosis not present

## 2022-04-03 ENCOUNTER — Encounter: Payer: Self-pay | Admitting: *Deleted

## 2022-04-12 ENCOUNTER — Encounter: Payer: Self-pay | Admitting: *Deleted

## 2022-04-12 NOTE — Patient Instructions (Signed)
  Procedure: colonoscopy  Estimated body mass index is 25.53 kg/m as calculated from the following:   Height as of this encounter: '5\' 7"'$  (1.702 m).   Weight as of this encounter: 163 lb (73.9 kg).   Have you had a colonoscopy before?  Yes  Do you have family history of colon cancer  No  Do you have a family history of polyps? No  Previous colonoscopy with polyps removed? Yes  Do you have a history colorectal cancer?   No  Are you diabetic?  No  Do you have a prosthetic or mechanical heart valve? No  Do you have a pacemaker/defibrillator?   No  Have you had endocarditis/atrial fibrillation?  No  Do you use supplemental oxygen/CPAP?  No  Have you had joint replacement within the last 12 months?  No  Do you tend to be constipated or have to use laxatives?  No   Do you have history of alcohol use? If yes, how much and how often.  No  Do you have history or are you using drugs? If yes, what do are you  using?  No  Have you ever had a stroke/heart attack?  No  Have you ever had a heart or other vascular stent placed,?No  Do you take weight loss medication? No  Do you take any blood-thinning medications such as: (Plavix, aspirin, Coumadin, Aggrenox, Brilinta, Xarelto, Eliquis, Pradaxa, Savaysa or Effient) Yes  If yes we need the name, milligram, dosage and who is prescribing doctor:  Aspirin 81 mg daily, Dr.Margaret Moshe Cipro             Current Outpatient Medications  Medication Sig Dispense Refill   alfuzosin (UROXATRAL) 10 MG 24 hr tablet Take 1 tablet (10 mg total) by mouth at bedtime. 30 tablet 11   amLODipine (NORVASC) 2.5 MG tablet Take 1 tablet (2.5 mg total) by mouth daily. 90 tablet 1   aspirin EC 81 MG tablet Take 1 tablet (81 mg total) by mouth daily. Swallow whole. 90 tablet 1   ergocalciferol (VITAMIN D2) 1.25 MG (50000 UT) capsule Take 1 capsule (50,000 Units total) by mouth once a week. One capsule once weekly 12 capsule 2   pantoprazole (PROTONIX) 20 MG  tablet Take 1 tablet (20 mg total) by mouth daily. 30 tablet 1   rosuvastatin (CRESTOR) 20 MG tablet Take 1 tablet (20 mg total) by mouth daily. 90 tablet 3   No current facility-administered medications for this visit.    No Known Allergies

## 2022-04-24 NOTE — Progress Notes (Signed)
Colonoscopy on 03/27/2019 with two 2-4 mm polyps, one 12 mm polyp with clips placed, and tortuous left colon.  Pathology with tubular adenomas.  Next colonoscopy in 2023.   Appropriate. ASA 2.

## 2022-04-25 NOTE — Progress Notes (Signed)
Spoke with pt. Offered several dates but couldn't do. Requested a Wednesday possible in December. Will call once we receive that schedule

## 2022-04-26 ENCOUNTER — Ambulatory Visit (INDEPENDENT_AMBULATORY_CARE_PROVIDER_SITE_OTHER): Payer: Medicare Other | Admitting: Physician Assistant

## 2022-04-26 VITALS — BP 141/86 | HR 69

## 2022-04-26 DIAGNOSIS — N401 Enlarged prostate with lower urinary tract symptoms: Secondary | ICD-10-CM | POA: Diagnosis not present

## 2022-04-26 DIAGNOSIS — R339 Retention of urine, unspecified: Secondary | ICD-10-CM

## 2022-04-26 DIAGNOSIS — R972 Elevated prostate specific antigen [PSA]: Secondary | ICD-10-CM

## 2022-04-26 DIAGNOSIS — N138 Other obstructive and reflux uropathy: Secondary | ICD-10-CM | POA: Diagnosis not present

## 2022-04-26 LAB — BLADDER SCAN AMB NON-IMAGING: Scan Result: 37

## 2022-04-26 NOTE — Progress Notes (Signed)
Assessment: 1. Benign prostatic hyperplasia with urinary obstruction - BLADDER SCAN AMB NON-IMAGING - Urinalysis, Routine w reflex microscopic  2. Incomplete emptying of bladder  3. Elevated PSA    Plan: Patient will continue Uroxatrol and is advised that he has a years refills on the prescription.  Again discussed importance of PSA follow-up. Next PSA scheduled for late November.    Chief Complaint: No chief complaint on file.   HPI: Wesley Brennan is a 61 y.o. male who presents for continued evaluation of BPH with obstruction and incomplete emptying of the bladder.  Patient also has history of elevated PSAs with follow-up scheduled next month. Pt started Uroxatrol at last visit and states he is doing well. Significant improvement in sxs of straining, urgency, and frequency.  Patient presents with his sister who will bring him to help his appointments in the future  UA=clear PVR=37m IPSS= 5      QOL= 1  03/15/22 Mr JMagnanis a 61yo here for evaluation of elevated PSA. PSA was 5.4 on 03/10/2022. IPSS 24 QOL 5 on no BPH therapy. Urine stream fair. Nocturia 3-4x. He has daily urge incontinence. He has urinary frequency every 60-90 minutes. He has intermittent straining to urinate. No hematuria or dysuria. PVR 193cc.   Portions of the above documentation were copied from a prior visit for review purposes only.  Allergies: No Known Allergies  PMH: Past Medical History:  Diagnosis Date   Gout    Hypercholesteremia    Hypertension    related to drug use which he has quit   [place]stance [place] ([place]    alcohol, nicotine, h/o coacaine and marijuana    PSH: Past Surgical History:  Procedure Laterality Date   COLONOSCOPY N/A 07/02/2015   Procedure: COLONOSCOPY;  Surgeon: SDanie Binder MD;  [facility];  Service: Endoscopy;  Laterality: N/A;  1:00 PM   COLONOSCOPY WITH PROPOFOL N/A 03/27/2019   Procedure: COLONOSCOPY WITH PROPOFOL;  Surgeon: FDanie Binder MD;   two 2-4 mm polyps, one 12 mm polyp with clips placed, and tortuous left colon.  Pathology with tubular adenomas.  Next colonoscopy in 2023.    ELBOW FUSION  1993   left elbow dislocation   ELBOW FUSION Left 1979   HERNIA REPAIR  [id number]   umbilical hernia   POLYPECTOMY  03/27/2019   Procedure: POLYPECTOMY;  Surgeon: FDanie Binder MD;  [facility];  Service: Endoscopy;;    SH: Social History   Tobacco Use   Smoking status: Former    Packs/day: 0.10    Years: 40.00    Total pack years: 4.00    Types: Cigarettes    Quit date: 02/08/2022    Years since quitting: 0.2   Smokeless tobacco: Never   Tobacco comments:    states not smoking often   Vaping Use   Vaping Use: Never used  Substance Use Topics   Alcohol use: Yes    Comment: 1 beer every other day   Drug use: No    ROS: All other review of systems were reviewed and are negative except what is noted above in HPI  PE: BP (!) 141/86   Pulse 69  GENERAL APPEARANCE:  Well appearing, well developed, well nourished, NAD HEENT:  Atraumatic, normocephalic NECK:  Supple. Trachea midline ABDOMEN:  Soft, non-tender, no masses EXTREMITIES:  Moves all extremities well, without clubbing, cyanosis, or edema NEUROLOGIC:  Alert and oriented x 3, normal gait,  MENTAL STATUS:  appropriate BACK:  Non-tender to palpation, No CVAT SKIN:  Warm, dry, and intact   Results: Laboratory Data: Lab Results  Component Value Date   WBC 5.6 11/28/2021   HGB 13.7 11/28/2021   HCT 42.2 11/28/2021   MCV 100.5 (H) 11/28/2021   PLT 231 11/28/2021    Lab Results  Component Value Date   CREATININE 1.11 03/10/2022    Lab Results  Component Value Date   PSA 1.5 08/26/2019   PSA 1.4 04/25/2018   PSA CANCELED 02/29/2016   PSA 1.4 02/29/2016    No results found for: "TESTOSTERONE"  Lab Results  Component Value Date   HGBA1C 5.0 03/19/2019    Urinalysis    Component Value Date/Time   COLORURINE YELLOW 11/28/2021  0622   APPEARANCEUR Clear 03/15/2022 1126   LABSPEC 1.015 11/28/2021 0622   PHURINE 5.0 11/28/2021 0622   GLUCOSEU Negative 03/15/2022 1126   HGBUR NEGATIVE 11/28/2021 0622   BILIRUBINUR Negative 03/15/2022 1126   KETONESUR negative 03/10/2022 0922   KETONESUR NEGATIVE 11/28/2021 0622   PROTEINUR Negative 03/15/2022 1126   PROTEINUR NEGATIVE 11/28/2021 0622   UROBILINOGEN 1.0 03/10/2022 0922   NITRITE Negative 03/15/2022 1126   NITRITE NEGATIVE 11/28/2021 0622   LEUKOCYTESUR Negative 03/15/2022 1126   LEUKOCYTESUR NEGATIVE 11/28/2021 0622    Lab Results  Component Value Date   LABMICR Comment 03/15/2022    Pertinent Imaging: No results found for this or any previous visit.  No results found for this or any previous visit.  No results found for this or any previous visit.  No results found for this or any previous visit.  No results found for this or any previous visit.  No valid procedures specified. No results found for this or any previous visit.  No results found for this or any previous visit.  No results found for this or any previous visit (from the past 24 hour(s)).

## 2022-04-27 LAB — URINALYSIS, ROUTINE W REFLEX MICROSCOPIC
Bilirubin, UA: NEGATIVE
Glucose, UA: NEGATIVE
Leukocytes,UA: NEGATIVE
Nitrite, UA: NEGATIVE
Protein,UA: NEGATIVE
Specific Gravity, UA: 1.01 (ref 1.005–1.030)
Urobilinogen, Ur: 0.2 mg/dL (ref 0.2–1.0)
pH, UA: 5 (ref 5.0–7.5)

## 2022-04-27 LAB — MICROSCOPIC EXAMINATION
Bacteria, UA: NONE SEEN
Epithelial Cells (non renal): NONE SEEN /hpf (ref 0–10)
WBC, UA: NONE SEEN /hpf (ref 0–5)

## 2022-05-19 NOTE — Progress Notes (Signed)
LMTRC

## 2022-05-22 ENCOUNTER — Encounter: Payer: Self-pay | Admitting: Cardiology

## 2022-05-22 NOTE — Progress Notes (Unsigned)
Cardiology Office Note  Date: 05/23/2022   ID: Wesley Brennan, DOB September 08, 1960, MRN [id number]  PCP:  Fayrene Helper, MD  Cardiologist:  Carlyle Dolly, MD Electrophysiologist:  None   Chief Complaint  Patient presents with   Chest Pain    History of Present Illness: Wesley Brennan is a 61 y.o. male referred for cardiology consultation by Dr. Moshe Cipro for evaluation of chest pain.  I reviewed the available records.  He is actually a patient of Dr. Harl Bowie, last seen in the office by Ms. Vita Barley in January 2022.  He has a several year history of recurrent chest pain.  Describes both a sharp sensation and tightness in his chest, sometimes unpredictable and lasting only a few seconds, sometimes happens when he is riding his bicycle working outside.  He does remain fairly active.  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  No history of syncope, no claudication.  No specific sense of palpitations.  Cardiac structural and ischemic testing from 2019 are outlined below.  Stress test was described as high risk based on calculated LVEF of 35%, however this was not corroborated by echocardiography demonstrating low normal LVEF at 50%.  He did have an inferior/inferior apical perfusion defect suggesting potential scar but no definite ischemia and there were no diagnostic ST segment changes with exercise.  I reviewed his medications, he remains on Norvasc and Crestor.  Most recent LDL 171 in the absence of medication compliance.  Past Medical History:  Diagnosis Date   Gout    Hypercholesteremia    Hypertension    Substance abuse ([place])    Alcohol, nicotine, h/o cocaine and marijuana    Past Surgical History:  Procedure Laterality Date   COLONOSCOPY N/A 07/02/2015   Procedure: COLONOSCOPY;  Surgeon: Danie Binder, MD;  [facility];  Service: Endoscopy;  Laterality: N/A;  1:00 PM   COLONOSCOPY WITH PROPOFOL N/A 03/27/2019    Procedure: COLONOSCOPY WITH PROPOFOL;  Surgeon: Danie Binder, MD;  two 2-4 mm polyps, one 12 mm polyp with clips placed, and tortuous left colon.  Pathology with tubular adenomas.  Next colonoscopy in 2023.    ELBOW FUSION  1993   left elbow dislocation   ELBOW FUSION Left 1979   HERNIA REPAIR  [id number] approx   umbilical hernia   POLYPECTOMY  03/27/2019   Procedure: POLYPECTOMY;  Surgeon: Danie Binder, MD;  [facility];  Service: Endoscopy;;    Current Outpatient Medications  Medication Sig Dispense Refill   alfuzosin (UROXATRAL) 10 MG 24 hr tablet Take 1 tablet (10 mg total) by mouth at bedtime. 30 tablet 11   amLODipine (NORVASC) 2.5 MG tablet Take 1 tablet (2.5 mg total) by mouth daily. 90 tablet 1   aspirin EC 81 MG tablet Take 1 tablet (81 mg total) by mouth daily. Swallow whole. 90 tablet 1   ergocalciferol (VITAMIN D2) 1.25 MG (50000 UT) capsule Take 1 capsule (50,000 Units total) by mouth once a week. One capsule once weekly 12 capsule 2   pantoprazole (PROTONIX) 20 MG tablet Take 1 tablet (20 mg total) by mouth daily. 30 tablet 1   rosuvastatin (CRESTOR) 20 MG tablet Take 1 tablet (20 mg total) by mouth daily. 90 tablet 3   No current facility-administered medications for this visit.   Allergies:  Patient has no known allergies.   Social History: The patient  reports that he quit  smoking about 3 months ago. His smoking use included cigarettes. He has a 4.00 pack-year smoking history. He has never used smokeless tobacco. He reports current alcohol use. He reports that he does not use drugs.   Family History: The patient's family history includes Cancer (age of onset: 37) in his mother; Diabetes in his father and mother; Heart disease (age of onset: 46) in his father; Hypertension in his sister; Stroke (age of onset: 54) in his father. He was adopted.   ROS: No sudden unexplained syncope.  No orthopnea or PND.  Physical Exam: VS:  BP 120/70   Pulse 77   Ht 5'  7" (1.702 m)   Wt 145 lb 3.2 oz (65.9 kg)   SpO2 96%   BMI 22.74 kg/m , BMI Body mass index is 22.74 kg/m.  Wt Readings from Last 3 Encounters:  05/23/22 145 lb 3.2 oz (65.9 kg)  04/12/22 163 lb (73.9 kg)  03/10/22 149 lb (67.6 kg)    General: Patient appears comfortable at rest. HEENT: Conjunctiva and lids normal, oropharynx clear.  Wearing glasses. Neck: Supple, no elevated JVP or carotid bruits, no thyromegaly. Lungs: Clear to auscultation, nonlabored breathing at rest. Cardiac: Regular rate and rhythm, no S3 or significant systolic murmur, no pericardial rub. Abdomen: Soft, nontender, bowel sounds present. Extremities: No pitting edema, distal pulses 2+. Skin: Warm and dry. Musculoskeletal: No kyphosis. Neuropsychiatric: Alert and oriented x3, affect grossly appropriate.  ECG:  An ECG dated 03/10/2022 was personally reviewed today and demonstrated:  Sinus rhythm with nonspecific T wave changes.  Recent Labwork: 11/28/2021: Hemoglobin 13.7; Platelets 231 03/10/2022: ALT 21; AST 32; BUN 22; Creatinine, Ser 1.11; Potassium 4.6; Sodium 141; TSH 1.690     Component Value Date/Time   CHOL 273 (H) 03/10/2022 0906   TRIG 90 03/10/2022 0906   HDL 87 03/10/2022 0906   CHOLHDL 3.1 03/10/2022 0906   CHOLHDL 2.5 08/11/2020 0835   VLDL 34 08/11/2020 0835   LDLCALC 171 (H) 03/10/2022 0906   LDLCALC  08/26/2019 0730     Comment:     . LDL cholesterol not calculated. Triglyceride levels greater than 400 mg/dL invalidate calculated LDL results. . Reference range: <100 . Desirable range <100 mg/dL for primary prevention;   <70 mg/dL for patients with CHD or diabetic patients  with > or = 2 CHD risk factors. Marland Kitchen LDL-C is now calculated using the Martin-Hopkins  calculation, which is a validated novel method providing  better accuracy than the Friedewald equation in the  estimation of LDL-C.  Cresenciano Genre et al. Annamaria Helling. 3710;626(94): 2061-2068   (http://education.[employer].com/faq/FAQ164)     Other Studies Reviewed Today:  Exercise Myoview 01/07/2018: Blood pressure demonstrated a hypertensive response to exercise. Exercise stopped due to high bp's, peak exercise does not reflect patients exercise capacity. There was no ST segment deviation noted during stress. 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 There is adjacent gut radiotracer uptake in this area, however the wall motion appears hypokinetic suggesting true prior infarct. No current ischemia This is a high risk study. High risk based on decreased LVEF. There is no current myocardium at jeopardy. Recommend correlating with echo. The left ventricular ejection fraction is moderately decreased (34%).  Echocardiogram 01/07/2018: - Left ventricle: The cavity size was normal. Wall thickness was    normal. Systolic function was normal. The estimated ejection    fraction was = 50%. Wall motion was normal; there were no    regional wall motion abnormalities. The study is not technically  sufficient to allow evaluation of LV diastolic function.  - Aortic valve: Valve area (VTI): 1.8 cm^2. Valve area (Vmax): 2.18    cm^2.  - Atrial septum: No defect or patent foramen ovale was identified.  - Technically adequate study.   Carotid Dopplers 03/29/2022: IMPRESSION: Color duplex indicates minimal homogeneous plaque, with no hemodynamically significant stenosis by duplex criteria in the extracranial cerebrovascular circulation.  Assessment and Plan:  1.  Longstanding history of recurrent chest pain.  Chart reviewed.  He underwent cardiac structural and ischemic testing back in 2019 and was managed medically.  Plan to obtain a follow-up echocardiogram as well as exercise Myoview for reevaluation.  I reviewed his recent ECG showing nonspecific T wave changes.  He is on Norvasc and Crestor at baseline with question of compliance based on most  recent LDL of 171.  2.  Essential hypertension, blood pressure is normal today.  He is on low-dose Norvasc.  Medication Adjustments/Labs and Tests Ordered: Current medicines are reviewed at length with the patient today.  Concerns regarding medicines are outlined above.   Tests Ordered: Orders Placed This Encounter  Procedures   ECHOCARDIOGRAM COMPLETE    Medication Changes: No orders of the defined types were placed in this encounter.   Disposition:  Follow up  test results.  Signed, Satira Sark, MD, [facility] 05/23/2022 9:13 AM    [facility]. [address] [place], [place] [address] Phone: [phone]; Fax: [phone]

## 2022-05-23 ENCOUNTER — Encounter: Payer: Self-pay | Admitting: Cardiology

## 2022-05-23 ENCOUNTER — Ambulatory Visit: Payer: Medicare Other | Attending: Cardiology | Admitting: Cardiology

## 2022-05-23 VITALS — BP 120/70 | HR 77 | Ht 67.0 in | Wt 145.2 lb

## 2022-05-23 DIAGNOSIS — R9431 Abnormal electrocardiogram [ECG] [EKG]: Secondary | ICD-10-CM | POA: Diagnosis not present

## 2022-05-23 DIAGNOSIS — R072 Precordial pain: Secondary | ICD-10-CM

## 2022-05-23 DIAGNOSIS — I10 Essential (primary) hypertension: Secondary | ICD-10-CM | POA: Diagnosis not present

## 2022-05-23 NOTE — Patient Instructions (Signed)
Medication Instructions:  Your physician recommends that you continue on your current medications as directed. Please refer to the Current Medication list given to you today.   Labwork: None today  Testing/Procedures: Your physician has requested that you have an echocardiogram. Echocardiography is a painless test that uses sound waves to create images of your heart. It provides your doctor with information about the size and shape of your heart and how well your heart's chambers and valves are working. This procedure takes approximately one hour. There are no restrictions for this procedure. Please do NOT wear cologne, perfume, aftershave, or lotions (deodorant is allowed). Please arrive 15 minutes prior to your appointment time.    Your physician has requested that you have en exercise stress myoview. For further information please visit [email or web address]. Please follow instruction sheet, as given.   Follow-Up: To be determined after testing  Any Other Special Instructions Will Be Listed Below (If Applicable).  If you need a refill on your cardiac medications before your next appointment, please call your pharmacy.

## 2022-05-31 ENCOUNTER — Other Ambulatory Visit: Payer: Medicare Other

## 2022-05-31 DIAGNOSIS — R972 Elevated prostate specific antigen [PSA]: Secondary | ICD-10-CM

## 2022-06-01 LAB — PSA: Prostate Specific Ag, Serum: 5.6 ng/mL — ABNORMAL HIGH (ref 0.0–4.0)

## 2022-06-02 ENCOUNTER — Encounter (HOSPITAL_COMMUNITY): Payer: Medicare Other

## 2022-06-06 ENCOUNTER — Ambulatory Visit (HOSPITAL_BASED_OUTPATIENT_CLINIC_OR_DEPARTMENT_OTHER)
Admission: RE | Admit: 2022-06-06 | Discharge: 2022-06-06 | Disposition: A | Discharge status: Home / Self Care | Payer: Medicare Other | Source: Ambulatory Visit | Attending: Cardiology | Admitting: Cardiology

## 2022-06-06 ENCOUNTER — Encounter (HOSPITAL_COMMUNITY): Payer: Self-pay

## 2022-06-06 ENCOUNTER — Encounter (HOSPITAL_COMMUNITY)
Admission: RE | Admit: 2022-06-06 | Discharge: 2022-06-06 | Disposition: A | Discharge status: Home / Self Care | Payer: Medicare Other | Source: Ambulatory Visit | Attending: Cardiology | Admitting: Cardiology

## 2022-06-06 ENCOUNTER — Ambulatory Visit (HOSPITAL_COMMUNITY)
Admission: RE | Admit: 2022-06-06 | Discharge: 2022-06-06 | Disposition: A | Discharge status: Home / Self Care | Payer: Medicare Other | Source: Ambulatory Visit | Attending: Cardiology | Admitting: Cardiology

## 2022-06-06 ENCOUNTER — Telehealth: Payer: Self-pay

## 2022-06-06 DIAGNOSIS — R072 Precordial pain: Secondary | ICD-10-CM | POA: Insufficient documentation

## 2022-06-06 LAB — NM MYOCAR MULTI W/SPECT W/WALL MOTION / EF
Angina Index: 0
Duke Treadmill Score: 5
Estimated workload: 6
Exercise duration (min): 4 min
Exercise duration (sec): 54 s
LV dias vol: 153 mL (ref 62–150)
LV sys vol: 90 mL
MPHR: 157 {beats}/min
Nuc Stress EF: 41 %
Peak HR: 139 {beats}/min
Percent HR: 88 %
RATE: 0.3
RPE: 13
Rest HR: 69 {beats}/min
Rest Nuclear Isotope Dose: 10.7 mCi
SDS: 4
SRS: 7
SSS: 11
ST Depression (mm): 0 mm
Stress Nuclear Isotope Dose: 31 mCi
TID: 1.05

## 2022-06-06 LAB — ECHOCARDIOGRAM COMPLETE
Area-P 1/2: 2.87 cm2
S' Lateral: 4.2 cm
Single Plane A2C EF: 42.2 %

## 2022-06-06 MED ORDER — SODIUM CHLORIDE FLUSH 0.9 % IV SOLN
INTRAVENOUS | Status: AC
  Administered 2022-06-06: 10 mL via INTRAVENOUS
  Filled 2022-06-06: qty 10

## 2022-06-06 MED ORDER — TECHNETIUM TC 99M TETROFOSMIN IV KIT
30.0000 | PACK | Freq: Once | INTRAVENOUS | Status: AC | PRN
  Administered 2022-06-06: 31 via INTRAVENOUS

## 2022-06-06 MED ORDER — TECHNETIUM TC 99M TETROFOSMIN IV KIT
10.0000 | PACK | Freq: Once | INTRAVENOUS | Status: AC | PRN
  Administered 2022-06-06: 10.7 via INTRAVENOUS

## 2022-06-06 MED ORDER — REGADENOSON 0.4 MG/5ML IV SOLN
INTRAVENOUS | Status: AC
  Filled 2022-06-06: qty 5

## 2022-06-06 MED ORDER — ROSUVASTATIN CALCIUM 40 MG PO TABS: 40.0000 mg | ORAL_TABLET | Freq: Every day | ORAL | 3 refills | Status: DC

## 2022-06-06 NOTE — Progress Notes (Signed)
*  PRELIMINARY RESULTS* Echocardiogram 2D Echocardiogram has been performed.  Samuel Germany 06/06/2022, 11:57 AM

## 2022-06-06 NOTE — Progress Notes (Signed)
*  PRELIMINARY RESULTS* Echocardiogram 2D Echocardiogram has been performed.  Wesley Brennan 06/06/2022, 11:56 AM

## 2022-06-06 NOTE — Telephone Encounter (Signed)
-----   Message from Satira Sark, MD sent at 06/06/2022  1:05 PM EST ----- Results reviewed.  Patient of Dr. Harl Bowie that I saw her recently in the office, referred back to by Dr. Moshe Cipro.  Stress test was overall intermediate risk based on probable inferior wall scar, although no definite ischemia noted.  LVEF calculated at 41%, would suggest an echocardiogram to clarify however (was under-calculated by [facility] in the past as well).  Clarify compliance with Crestor 20 mg daily, if he has been consistent with this suggest increasing to 40 mg daily based on his last LDL in the 170s.  Please arrange follow-up with Dr. Harl Bowie in the next 3 months for clinical reevaluation.

## 2022-06-06 NOTE — Telephone Encounter (Signed)
Results discussed with patient, will f/u on 10/11/22 with Dr.Branch.He will also increase Crestor to 40 mg qd

## 2022-06-07 ENCOUNTER — Encounter: Payer: Self-pay | Admitting: *Deleted

## 2022-06-07 ENCOUNTER — Ambulatory Visit: Payer: Medicare Other | Admitting: Urology

## 2022-06-07 NOTE — Progress Notes (Signed)
[place]. Will mail letter

## 2022-06-12 ENCOUNTER — Ambulatory Visit: Payer: Medicare Other | Admitting: Urology

## 2022-06-14 ENCOUNTER — Encounter: Payer: Self-pay | Admitting: Family Medicine

## 2022-06-14 ENCOUNTER — Ambulatory Visit: Payer: Medicare Other | Admitting: Urology

## 2022-06-14 ENCOUNTER — Ambulatory Visit: Payer: Medicare Other | Admitting: Family Medicine

## 2022-06-16 ENCOUNTER — Telehealth: Payer: Self-pay | Admitting: Internal Medicine

## 2022-06-16 NOTE — Telephone Encounter (Signed)
See triage documentation from 03/2022.   Called pt, no answer and not able to leave VM to schedule TCS with Dr. Abbey Chatters, ASA 2, hx polyps

## 2022-06-19 ENCOUNTER — Encounter: Payer: Self-pay | Admitting: *Deleted

## 2022-06-19 MED ORDER — PEG 3350-KCL-NA BICARB-NACL 420 G PO SOLR: 4000.0000 mL | Freq: Once | ORAL | 0 refills | Status: AC

## 2022-06-19 NOTE — Telephone Encounter (Signed)
Pt has been scheduled for 07/05/22 at 12:30 pm, instructions mailed and prep sent to the pharmacy.

## 2022-06-19 NOTE — Telephone Encounter (Signed)
[employer] PA: APPROVED Authorization #: [id number]  DOS: 07/05/22-07/16/22

## 2022-06-30 ENCOUNTER — Encounter (HOSPITAL_COMMUNITY)
Admission: RE | Admit: 2022-06-30 | Discharge: 2022-06-30 | Disposition: A | Discharge status: Home / Self Care | Payer: Medicare Other | Source: Ambulatory Visit | Attending: Internal Medicine | Admitting: Internal Medicine

## 2022-06-30 ENCOUNTER — Telehealth: Payer: Self-pay | Admitting: *Deleted

## 2022-06-30 MED ORDER — PEG 3350-KCL-NA BICARB-NACL 420 G PO SOLR: 4000.0000 mL | Freq: Once | ORAL | 0 refills | Status: AC

## 2022-06-30 NOTE — Addendum Note (Signed)
Addended by: Cheron Every on: 06/30/2022 09:07 AM   Modules accepted: Orders

## 2022-06-30 NOTE — Telephone Encounter (Signed)
Received VM from pt requesting to r/s procedure for 12/20 to January. Scheduled for TCS with Dr. Abbey Chatters, ASA 2.  Called pt back, no answer and not able to leave VM.

## 2022-06-30 NOTE — Telephone Encounter (Signed)
Pt called back. He has been rescheduled to 1/19 at 12pm. Aware will send new instructions. He did not go pick up his prep either.

## 2022-07-18 ENCOUNTER — Telehealth: Payer: Self-pay | Admitting: *Deleted

## 2022-07-18 NOTE — Telephone Encounter (Signed)
Per [facility] PA still valid until 07/05/2022-10/03/2022, auth# [id number]

## 2022-08-02 ENCOUNTER — Telehealth: Payer: Self-pay | Admitting: *Deleted

## 2022-08-02 NOTE — Telephone Encounter (Signed)
Pt called and left message that he needed a return call.   Wesley Brennan

## 2022-08-04 ENCOUNTER — Encounter (HOSPITAL_COMMUNITY)
Admission: RE | Disposition: A | Discharge status: Home / Self Care | Payer: Self-pay | Source: Ambulatory Visit | Attending: Internal Medicine

## 2022-08-04 ENCOUNTER — Ambulatory Visit (HOSPITAL_COMMUNITY)
Admission: RE | Admit: 2022-08-04 | Discharge: 2022-08-04 | Disposition: A | Discharge status: Home / Self Care | Payer: 59 | Source: Ambulatory Visit | Attending: Internal Medicine | Admitting: Internal Medicine

## 2022-08-04 ENCOUNTER — Encounter (HOSPITAL_COMMUNITY): Payer: Self-pay

## 2022-08-04 ENCOUNTER — Ambulatory Visit (HOSPITAL_COMMUNITY): Payer: 59 | Admitting: Certified Registered Nurse Anesthetist

## 2022-08-04 ENCOUNTER — Ambulatory Visit (HOSPITAL_BASED_OUTPATIENT_CLINIC_OR_DEPARTMENT_OTHER): Payer: 59 | Admitting: Certified Registered Nurse Anesthetist

## 2022-08-04 ENCOUNTER — Other Ambulatory Visit: Payer: Self-pay

## 2022-08-04 DIAGNOSIS — D120 Benign neoplasm of cecum: Secondary | ICD-10-CM

## 2022-08-04 DIAGNOSIS — D124 Benign neoplasm of descending colon: Secondary | ICD-10-CM | POA: Diagnosis not present

## 2022-08-04 DIAGNOSIS — D123 Benign neoplasm of transverse colon: Secondary | ICD-10-CM

## 2022-08-04 DIAGNOSIS — I10 Essential (primary) hypertension: Secondary | ICD-10-CM | POA: Insufficient documentation

## 2022-08-04 DIAGNOSIS — Z86010 Personal history of colonic polyps: Secondary | ICD-10-CM

## 2022-08-04 DIAGNOSIS — Z09 Encounter for follow-up examination after completed treatment for conditions other than malignant neoplasm: Secondary | ICD-10-CM | POA: Diagnosis not present

## 2022-08-04 DIAGNOSIS — Z79899 Other long term (current) drug therapy: Secondary | ICD-10-CM | POA: Insufficient documentation

## 2022-08-04 DIAGNOSIS — F17210 Nicotine dependence, cigarettes, uncomplicated: Secondary | ICD-10-CM | POA: Diagnosis not present

## 2022-08-04 DIAGNOSIS — Z1211 Encounter for screening for malignant neoplasm of colon: Secondary | ICD-10-CM | POA: Diagnosis not present

## 2022-08-04 DIAGNOSIS — K6389 Other specified diseases of intestine: Secondary | ICD-10-CM | POA: Diagnosis not present

## 2022-08-04 HISTORY — PX: COLONOSCOPY WITH PROPOFOL: SHX5780

## 2022-08-04 HISTORY — PX: POLYPECTOMY: SHX149

## 2022-08-04 SURGERY — COLONOSCOPY WITH PROPOFOL
Anesthesia: General

## 2022-08-04 MED ORDER — LACTATED RINGERS IV SOLN
INTRAVENOUS | Status: DC
  Administered 2022-08-04: 1000 mL via INTRAVENOUS

## 2022-08-04 MED ORDER — PROPOFOL 10 MG/ML IV BOLUS
INTRAVENOUS | Status: DC | PRN
  Administered 2022-08-04: 20 mg via INTRAVENOUS
  Administered 2022-08-04 (×2): 30 mg via INTRAVENOUS
  Administered 2022-08-04: 50 mg via INTRAVENOUS
  Administered 2022-08-04: 80 mg via INTRAVENOUS
  Administered 2022-08-04: 50 mg via INTRAVENOUS

## 2022-08-04 MED ORDER — LIDOCAINE HCL (CARDIAC) PF 100 MG/5ML IV SOSY
PREFILLED_SYRINGE | INTRAVENOUS | Status: DC | PRN
  Administered 2022-08-04: 50 mg via INTRAVENOUS

## 2022-08-04 NOTE — Anesthesia Postprocedure Evaluation (Signed)
Anesthesia Post Note  Patient: BRENDIN SITU  Procedure(s) Performed: COLONOSCOPY WITH PROPOFOL POLYPECTOMY INTESTINAL  Patient location during evaluation: PACU Anesthesia Type: General Level of consciousness: awake and alert Pain management: pain level controlled Vital Signs Assessment: post-procedure vital signs reviewed and stable Respiratory status: spontaneous breathing, nonlabored ventilation, respiratory function stable and patient connected to nasal cannula oxygen Cardiovascular status: stable and blood pressure returned to baseline Postop Assessment: no apparent nausea or vomiting Anesthetic complications: no   No notable events documented.   Last Vitals:  Vitals:   08/04/22 1114 08/04/22 1211  BP: 139/78 101/63  Pulse: 66 67  Resp: 17 16  Temp: 36.5 C 36.6 C  SpO2: 98% 98%    Last Pain:  Vitals:   08/04/22 1216  TempSrc:   PainSc: 0-No pain                 Nicanor Alcon

## 2022-08-04 NOTE — Op Note (Signed)
[facility] Patient Name: Wesley Brennan Procedure Date: 08/04/2022 11:41 AM MRN: [id number] Date of Birth: [birth year] Attending MD: Elon Alas. Abbey Chatters , [place], [id number] CSN: [id number] Age: 62 Admit Type: Outpatient Procedure:                Colonoscopy Indications:              Surveillance: Personal history of adenomatous                            polyps on last colonoscopy > 3 years ago Providers:                Elon Alas. Abbey Chatters, DO, Crystal Page, Illene Labrador Referring MD:              Medicines:                See the Anesthesia note for documentation of the                            administered medications Complications:            No immediate complications. Estimated Blood Loss:     Estimated blood loss was minimal. Procedure:                Pre-Anesthesia Assessment:                           - The anesthesia plan was to use monitored                            anesthesia care (MAC).                           After obtaining informed consent, the colonoscope                            was passed under direct vision. Throughout the                            procedure, the patient's blood pressure, pulse, and                            oxygen saturations were monitored continuously. The                            [id number]-HQ190L ([id number]) scope was introduced through                            the anus and advanced to the the cecum, identified                            by appendiceal orifice and ileocecal valve. The                            colonoscopy was performed without difficulty.  The                            patient tolerated the procedure well. The quality                            of the bowel preparation was evaluated using the                            BBPS [facility] Bowel Preparation Scale) with scores                            of: Right Colon = 3, Transverse Colon = 3 and Left                            Colon = 3 (entire  mucosa seen well with no residual                            staining, small fragments of stool or opaque                            liquid). The total BBPS score equals 9. Scope In: 11:45:27 AM Scope Out: 12:08:58 PM Scope Withdrawal Time: 0 hours 20 minutes 24 seconds  Total Procedure Duration: 0 hours 23 minutes 31 seconds  Findings:      The perianal and digital rectal examinations were normal.      A 2 mm polyp was found in the cecum. The polyp was sessile. The polyp       was removed with a cold biopsy forceps. Resection and retrieval were       complete.      A 5 mm polyp was found in the cecum. The polyp was sessile. The polyp       was removed with a cold snare. Resection and retrieval were complete.      Five sessile polyps were found in the descending colon and transverse       colon. The polyps were 4 to 6 mm in size. These polyps were removed with       a cold snare. Resection and retrieval were complete.      The exam was otherwise without abnormality. Impression:               - One 2 mm polyp in the cecum, removed with a cold                            biopsy forceps. Resected and retrieved.                           - One 5 mm polyp in the cecum, removed with a cold                            snare. Resected and retrieved.                           - Five 4 to 6 mm polyps in the descending colon and  in the transverse colon, removed with a cold snare.                            Resected and retrieved.                           - The examination was otherwise normal. Moderate Sedation:      Per Anesthesia Care Recommendation:           - Patient has a contact number available for                            emergencies. The signs and symptoms of potential                            delayed complications were discussed with the                            patient. Return to normal activities tomorrow.                            Written discharge  instructions were provided to the                            patient.                           - Resume previous diet.                           - Continue present medications.                           - Await pathology results.                           - Repeat colonoscopy in 3 years for surveillance.                           - Return to GI clinic PRN. Procedure Code(s):        --- Professional ---                           [phone], Colonoscopy, flexible; with removal of                            tumor(s), polyp(s), or other lesion(s) by snare                            technique                           38756, 3, Colonoscopy, flexible; with biopsy,                            single or multiple Diagnosis Code(s):        --- Professional ---  Z86.010, Personal history of colonic polyps                           D12.0, Benign neoplasm of cecum                           D12.4, Benign neoplasm of descending colon                           D12.3, Benign neoplasm of transverse colon (hepatic                            flexure or splenic flexure) CPT copyright 2022 American Medical Association. All rights reserved. The codes documented in this report are preliminary and upon coder review may  be revised to meet current compliance requirements. Elon Alas. Abbey Chatters, DO [place] Abbey Chatters, DO 08/04/2022 12:16:34 PM This report has been signed electronically. Number of Addenda: 0

## 2022-08-04 NOTE — Anesthesia Preprocedure Evaluation (Signed)
Anesthesia Evaluation  Patient identified by MRN, date of birth, ID band Patient awake    Reviewed: Allergy & Precautions, NPO status , Patient's Chart, lab work & pertinent test results  Airway Mallampati: II  TM Distance: >3 FB Neck ROM: Full    Dental no notable dental hx. (+) Edentulous Upper, Edentulous Lower   Pulmonary neg pulmonary ROS, Current Smoker and Patient abstained from smoking.   Pulmonary exam normal breath sounds clear to auscultation       Cardiovascular Exercise Tolerance: Good hypertension, Pt. on medications Normal cardiovascular examI Rhythm:Regular Rate:Normal  Rides bike-does yard work -denies CP or DOE   Neuro/Psych        H/O Substance abuse Alcohol, nicotine, h/o cocaine and marijuana negative psych ROS   GI/Hepatic negative GI ROS, Neg liver ROS,,,  Endo/Other  negative endocrine ROS    Renal/GU negative Renal ROS  negative genitourinary   Musculoskeletal negative musculoskeletal ROS (+)    Abdominal   Peds negative pediatric ROS (+)  Hematology negative hematology ROS (+)   Anesthesia Other Findings   Reproductive/Obstetrics negative OB ROS                             Anesthesia Physical Anesthesia Plan  ASA: 2  Anesthesia Plan: General   Post-op Pain Management:    Induction: Intravenous  PONV Risk Score and Plan: Propofol infusion, TIVA and Treatment may vary due to age or medical condition  Airway Management Planned: Nasal Cannula and Simple Face Mask  Additional Equipment:   Intra-op Plan:   Post-operative Plan:   Informed Consent: I have reviewed the patients History and Physical, chart, labs and discussed the procedure including the risks, benefits and alternatives for the proposed anesthesia with the patient or authorized representative who has indicated his/her understanding and acceptance.     Dental advisory given  Plan Discussed  with: CRNA  Anesthesia Plan Comments:         Anesthesia Quick Evaluation

## 2022-08-04 NOTE — H&P (Signed)
Primary Care Physician:  Fayrene Helper, MD Primary Gastroenterologist:  Dr. Abbey Chatters  Pre-Procedure History & Physical: HPI:  Wesley Brennan is a 62 y.o. male is here for a colonoscopy to be performed for surveillance purposes, personal history of adenomatous colon polyps in 2020  Past Medical History:  Diagnosis Date   Gout    Hypercholesteremia    Hypertension    Substance abuse ([place])    Alcohol, nicotine, h/o cocaine and marijuana    Past Surgical History:  Procedure Laterality Date   COLONOSCOPY N/A 07/02/2015   Procedure: COLONOSCOPY;  Surgeon: Danie Binder, MD;  Location: [facility];  Service: Endoscopy;  Laterality: N/A;  1:00 PM   COLONOSCOPY WITH PROPOFOL N/A 03/27/2019   Procedure: COLONOSCOPY WITH PROPOFOL;  Surgeon: Danie Binder, MD;  two 2-4 mm polyps, one 12 mm polyp with clips placed, and tortuous left colon.  Pathology with tubular adenomas.  Next colonoscopy in 2023.    ELBOW FUSION  1993   left elbow dislocation   ELBOW FUSION Left 1979   HERNIA REPAIR  9767 approx   umbilical hernia   POLYPECTOMY  03/27/2019   Procedure: POLYPECTOMY;  Surgeon: Danie Binder, MD;  Location: [facility];  Service: Endoscopy;;   TONSILLECTOMY      Prior to Admission medications   Medication Sig Start Date End Date Taking? Authorizing Provider  alfuzosin (UROXATRAL) 10 MG 24 hr tablet Take 1 tablet (10 mg total) by mouth at bedtime. 03/15/22  Yes McKenzie, Candee Furbish, MD  allopurinol (ZYLOPRIM) 300 MG tablet Take 300 mg by mouth daily.   Yes [provider]  amLODipine (NORVASC) 2.5 MG tablet Take 1 tablet (2.5 mg total) by mouth daily. 03/10/22  Yes Fayrene Helper, MD  aspirin EC 81 MG tablet Take 1 tablet (81 mg total) by mouth daily. Swallow whole. 03/10/22  Yes Fayrene Helper, MD  Doxylamine-Phenylephrine-[facility] DAYQUIL/NYQUIL PO) Take 1 Dose by mouth 2 (two) times daily as needed (cold symptoms).   Yes [provider]   Multiple Vitamins-Minerals (ADULT GUMMY PO) Take 1 capsule by mouth daily.   Yes [provider]  pantoprazole (PROTONIX) 20 MG tablet Take 1 tablet (20 mg total) by mouth daily. 03/10/22  Yes Fayrene Helper, MD  rosuvastatin (CRESTOR) 20 MG tablet Take 20 mg by mouth daily.   Yes [provider]  rosuvastatin (CRESTOR) 40 MG tablet Take 1 tablet (40 mg total) by mouth daily. 06/06/22 06/01/23  Satira Sark, MD  TURMERIC PO Take 1 tablet by mouth daily.   Yes [provider]  acetaminophen (TYLENOL) 500 MG tablet Take 1,000 mg by mouth every 6 (six) hours as needed for moderate pain.    [provider]  ergocalciferol (VITAMIN D2) 1.25 MG (50000 UT) capsule Take 1 capsule (50,000 Units total) by mouth once a week. One capsule once weekly Patient not taking: Reported on 08/01/2022 03/13/22   Fayrene Helper, MD    Allergies as of 06/19/2022   (No Known Allergies)    Family History  Adopted: Yes  Problem Relation Age of Onset   Cancer Mother 39       Bone    Diabetes Mother    Hypertension Sister    Stroke Father 31   Heart disease Father 33   Diabetes Father    Colon cancer Neg Hx     Social History   Socioeconomic History   Marital status: Single  Spouse name: Not on file   Number of children: 0   Years of education: 40   Highest education level: 12th grade  Occupational History   Occupation: disabled   Tobacco Use   Smoking status: Every Day    Packs/day: 0.10    Years: 40.00    Total pack years: 4.00    Types: Cigarettes    Last attempt to quit: 02/08/2022    Years since quitting: 0.4   Smokeless tobacco: Never   Tobacco comments:    states not smoking often   Vaping Use   Vaping Use: Never used  Substance and Sexual Activity   Alcohol use: Yes    Comment: 1 40oz beer every day   Drug use: No   Sexual activity: Yes    Birth control/protection: Condom  Other Topics Concern   Not on file  Social History  Narrative   Lives alone    Social Determinants of Health   Financial Resource Strain: Low Risk  (06/08/2020)   Overall Financial Resource Strain (CARDIA)    Difficulty of Paying Living Expenses: Not hard at all  Food Insecurity: No Food Insecurity (06/08/2020)   Hunger Vital Sign    Worried About Running Out of Food in the Last Year: Never true    Ran Out of Food in the Last Year: Never true  Transportation Needs: Unmet Transportation Needs (06/08/2020)   PRAPARE - Transportation    Lack of Transportation (Medical): Yes    Lack of Transportation (Non-Medical): Yes  Physical Activity: Sufficiently Active (06/08/2020)   Exercise Vital Sign    Days of Exercise per Week: 7 days    Minutes of Exercise per Session: 60 min  Stress: No Stress Concern Present (06/08/2020)   [place]    Feeling of Stress : Not at all  Social Connections: Moderately Isolated (06/08/2020)   Social Connection and Isolation Panel [NHANES]    Frequency of Communication with Friends and Family: More than three times a week    Frequency of Social Gatherings with Friends and Family: Three times a week    Attends Religious Services: More than 4 times per year    Active Member of Clubs or Organizations: No    Attends Archivist Meetings: Never    Marital Status: Never married  Intimate Partner Violence: Not At Risk (06/08/2020)   Humiliation, Afraid, Rape, and Kick questionnaire    Fear of Current or Ex-Partner: No    Emotionally Abused: No    Physically Abused: No    Sexually Abused: No    Review of Systems: See HPI, otherwise negative ROS  Physical Exam: Vital signs in last 24 hours:     General:   Alert,  Well-developed, well-nourished, pleasant and cooperative in NAD Head:  Normocephalic and atraumatic. Eyes:  Sclera clear, no icterus.   Conjunctiva pink. Ears:  Normal auditory acuity. Nose:  No deformity, discharge,  or  lesions. Msk:  Symmetrical without gross deformities. Normal posture. Extremities:  Without clubbing or edema. Neurologic:  Alert and  oriented x4;  grossly normal neurologically. Skin:  Intact without significant lesions or rashes. Psych:  Alert and cooperative. Normal mood and affect.  Impression/Plan: Wesley Brennan is here for a colonoscopy to be performed for surveillance purposes, personal history of adenomatous colon polyps in 2020  The risks of the procedure including infection, bleed, or perforation as well as benefits, limitations, alternatives and imponderables have been reviewed with the  patient. Questions have been answered. All parties agreeable.

## 2022-08-04 NOTE — Discharge Instructions (Addendum)
  Colonoscopy Discharge Instructions  Read the instructions outlined below and refer to this sheet in the next few weeks. These discharge instructions provide you with general information on caring for yourself after you leave the hospital. Your doctor may also give you specific instructions. While your treatment has been planned according to the most current medical practices available, unavoidable complications occasionally occur.   ACTIVITY You may resume your regular activity, but move at a slower pace for the next 24 hours.  Take frequent rest periods for the next 24 hours.  Walking will help get rid of the air and reduce the bloated feeling in your belly (abdomen).  No driving for 24 hours (because of the medicine (anesthesia) used during the test).   Do not sign any important legal documents or operate any machinery for 24 hours (because of the anesthesia used during the test).  NUTRITION Drink plenty of fluids.  You may resume your normal diet as instructed by your doctor.  Begin with a light meal and progress to your normal diet. Heavy or fried foods are harder to digest and may make you feel sick to your stomach (nauseated).  Avoid alcoholic beverages for 24 hours or as instructed.  MEDICATIONS You may resume your normal medications unless your doctor tells you otherwise.  WHAT YOU CAN EXPECT TODAY Some feelings of bloating in the abdomen.  Passage of more gas than usual.  Spotting of blood in your stool or on the toilet paper.  IF YOU HAD POLYPS REMOVED DURING THE COLONOSCOPY: No aspirin products for 7 days or as instructed.  No alcohol for 7 days or as instructed.  Eat a soft diet for the next 24 hours.  FINDING OUT THE RESULTS OF YOUR TEST Not all test results are available during your visit. If your test results are not back during the visit, make an appointment with your caregiver to find out the results. Do not assume everything is normal if you have not heard from your  caregiver or the medical facility. It is important for you to follow up on all of your test results.  SEEK IMMEDIATE MEDICAL ATTENTION IF: You have more than a spotting of blood in your stool.  Your belly is swollen (abdominal distention).  You are nauseated or vomiting.  You have a temperature over 101.  You have abdominal pain or discomfort that is severe or gets worse throughout the day.   Your colonoscopy revealed 7 polyp(s) which I removed successfully. Await pathology results, my office will contact you. I recommend repeating colonoscopy in 3 years for surveillance purposes. Otherwise follow up with GI as needed.     I hope you have a great rest of your week!  Charles K. Carver, D.O. Gastroenterology and Hepatology [facility]  

## 2022-08-04 NOTE — Transfer of Care (Signed)
Immediate Anesthesia Transfer of Care Note  Patient: Wesley Brennan  Procedure(s) Performed: COLONOSCOPY WITH PROPOFOL POLYPECTOMY INTESTINAL  Patient Location: Endoscopy Unit  Anesthesia Type:General  Level of Consciousness: awake, alert , and oriented  Airway & Oxygen Therapy: Patient Spontanous Breathing  Post-op Assessment: Report given to RN and Post -op Vital signs reviewed and stable  Post vital signs: Reviewed and stable  Last Vitals:  Vitals Value Taken Time  BP 101/63 08/04/22 1212  Temp 36.6 C 08/04/22 1211  Pulse 67 08/04/22 1213  Resp 16 08/04/22 1211  SpO2 99 % 08/04/22 1213  Vitals shown include unvalidated device data.  Last Pain:  Vitals:   08/04/22 1211  TempSrc: Oral  PainSc: 0-No pain      Patients Stated Pain Goal: 8 (50/09/38 1829)  Complications: No notable events documented.

## 2022-08-07 LAB — SURGICAL PATHOLOGY

## 2022-08-09 ENCOUNTER — Encounter (HOSPITAL_COMMUNITY): Payer: Self-pay | Admitting: Internal Medicine

## 2022-09-06 DIAGNOSIS — R0602 Shortness of breath: Secondary | ICD-10-CM | POA: Diagnosis not present

## 2022-09-06 DIAGNOSIS — R079 Chest pain, unspecified: Secondary | ICD-10-CM | POA: Insufficient documentation

## 2022-09-06 DIAGNOSIS — Z7982 Long term (current) use of aspirin: Secondary | ICD-10-CM | POA: Diagnosis not present

## 2022-09-06 DIAGNOSIS — R0789 Other chest pain: Secondary | ICD-10-CM | POA: Diagnosis not present

## 2022-09-06 DIAGNOSIS — R531 Weakness: Secondary | ICD-10-CM | POA: Insufficient documentation

## 2022-09-06 DIAGNOSIS — R112 Nausea with vomiting, unspecified: Secondary | ICD-10-CM | POA: Insufficient documentation

## 2022-09-06 DIAGNOSIS — Z79899 Other long term (current) drug therapy: Secondary | ICD-10-CM | POA: Insufficient documentation

## 2022-09-07 ENCOUNTER — Emergency Department (HOSPITAL_COMMUNITY)
Admission: EM | Admit: 2022-09-07 | Discharge: 2022-09-07 | Disposition: A | Discharge status: Home / Self Care | Payer: 59 | Attending: Emergency Medicine | Admitting: Emergency Medicine

## 2022-09-07 ENCOUNTER — Other Ambulatory Visit: Payer: Self-pay

## 2022-09-07 ENCOUNTER — Encounter (HOSPITAL_COMMUNITY): Payer: Self-pay | Admitting: Emergency Medicine

## 2022-09-07 ENCOUNTER — Emergency Department (HOSPITAL_COMMUNITY): Payer: 59

## 2022-09-07 DIAGNOSIS — R079 Chest pain, unspecified: Secondary | ICD-10-CM | POA: Diagnosis not present

## 2022-09-07 LAB — COMPREHENSIVE METABOLIC PANEL
ALT: 23 U/L (ref 0–44)
AST: 33 U/L (ref 15–41)
Albumin: 4.2 g/dL (ref 3.5–5.0)
Alkaline Phosphatase: 79 U/L (ref 38–126)
Anion gap: 13 (ref 5–15)
BUN: 26 mg/dL — ABNORMAL HIGH (ref 8–23)
CO2: 19 mmol/L — ABNORMAL LOW (ref 22–32)
Calcium: 8.8 mg/dL — ABNORMAL LOW (ref 8.9–10.3)
Chloride: 105 mmol/L (ref 98–111)
Creatinine, Ser: 1.05 mg/dL (ref 0.61–1.24)
GFR, Estimated: >60 mL/min (ref >60)
Glucose, Bld: 100 mg/dL — ABNORMAL HIGH (ref 70–99)
Potassium: 3.8 mmol/L (ref 3.5–5.1)
Sodium: 137 mmol/L (ref 135–145)
Total Bilirubin: 0.6 mg/dL (ref 0.3–1.2)
Total Protein: 7.4 g/dL (ref 6.5–8.1)

## 2022-09-07 LAB — CBC WITH DIFFERENTIAL/PLATELET
Abs Immature Granulocytes: 0.01 10*3/uL (ref 0.00–0.07)
Basophils Absolute: 0 10*3/uL (ref 0.0–0.1)
Basophils Relative: 1 %
Eosinophils Absolute: 0.4 10*3/uL (ref 0.0–0.5)
Eosinophils Relative: 6 %
HCT: 41 % (ref 39.0–52.0)
Hemoglobin: 13.6 g/dL (ref 13.0–17.0)
Immature Granulocytes: 0 %
Lymphocytes Relative: 45 %
Lymphs Abs: 3 10*3/uL (ref 0.7–4.0)
MCH: 32.9 pg (ref 26.0–34.0)
MCHC: 33.2 g/dL (ref 30.0–36.0)
MCV: 99.3 fL (ref 80.0–100.0)
Monocytes Absolute: 0.5 10*3/uL (ref 0.1–1.0)
Monocytes Relative: 8 %
Neutro Abs: 2.7 10*3/uL (ref 1.7–7.7)
Neutrophils Relative %: 40 %
Platelets: 202 10*3/uL (ref 150–400)
RBC: 4.13 MIL/uL — ABNORMAL LOW (ref 4.22–5.81)
RDW: 12.3 % (ref 11.5–15.5)
WBC: 6.6 10*3/uL (ref 4.0–10.5)
nRBC: 0 % (ref 0.0–0.2)

## 2022-09-07 LAB — TROPONIN I (HIGH SENSITIVITY)
Troponin I (High Sensitivity): 23 ng/L — ABNORMAL HIGH (ref <18)
Troponin I (High Sensitivity): 22 ng/L — ABNORMAL HIGH (ref <18)

## 2022-09-07 LAB — LIPASE, BLOOD: Lipase: 38 U/L (ref 11–51)

## 2022-09-07 LAB — BRAIN NATRIURETIC PEPTIDE: B Natriuretic Peptide: 112 pg/mL — ABNORMAL HIGH (ref 0.0–100.0)

## 2022-09-07 NOTE — ED Triage Notes (Signed)
Pt c/o ringing in his ears for a long time, some chest tightness when he lays down, also c/o soreness to right should for a long time.

## 2022-09-07 NOTE — ED Notes (Signed)
Patient transported to X-ray 

## 2022-09-07 NOTE — ED Provider Notes (Signed)
[facility] Provider Note   CSN: [id number] Arrival date & time: 09/06/22  2350     History  Chief Complaint  Patient presents with   Multiple Complaints    Wesley Brennan is a 62 y.o. male.  62 yo M here with chest pain, nausea, weakness, some sob intermittently. No trauma. No fever, cough, weight loss or other associated symptoms. Sometimes worse at night. Not associated with eating. No attempted alleviating factors at home.         Home Medications Prior to Admission medications   Medication Sig Start Date End Date Taking? Authorizing Provider  rosuvastatin (CRESTOR) 40 MG tablet Take 1 tablet (40 mg total) by mouth daily. 06/06/22 06/01/23  Satira Sark, MD  acetaminophen (TYLENOL) 500 MG tablet Take 1,000 mg by mouth every 6 (six) hours as needed for moderate pain.    [provider]  alfuzosin (UROXATRAL) 10 MG 24 hr tablet Take 1 tablet (10 mg total) by mouth at bedtime. 03/15/22   McKenzie, Candee Furbish, MD  allopurinol (ZYLOPRIM) 300 MG tablet Take 300 mg by mouth daily.    [provider]  amLODipine (NORVASC) 2.5 MG tablet Take 1 tablet (2.5 mg total) by mouth daily. 03/10/22   Fayrene Helper, MD  aspirin EC 81 MG tablet Take 1 tablet (81 mg total) by mouth daily. Swallow whole. 03/10/22   Fayrene Helper, MD  Doxylamine-Phenylephrine-APAP [facility] SINEX DAYQUIL/NYQUIL PO) Take 1 Dose by mouth 2 (two) times daily as needed (cold symptoms).    [provider]  ergocalciferol (VITAMIN D2) 1.25 MG (50000 UT) capsule Take 1 capsule (50,000 Units total) by mouth once a week. One capsule once weekly Patient not taking: Reported on 08/01/2022 03/13/22   Fayrene Helper, MD  Multiple Vitamins-Minerals (ADULT GUMMY PO) Take 1 capsule by mouth daily.    [provider]  pantoprazole (PROTONIX) 20 MG tablet Take 1 tablet (20 mg total) by mouth daily. 03/10/22   Fayrene Helper, MD   rosuvastatin (CRESTOR) 20 MG tablet Take 20 mg by mouth daily.    [provider]  TURMERIC PO Take 1 tablet by mouth daily.    [provider]      Allergies    Patient has no known allergies.    Review of Systems   Review of Systems  Physical Exam Updated Vital Signs BP 123/76   Pulse 74   Temp 97.9 F (36.6 C) (Oral)   Resp 14   Ht 5' 7"$  (1.702 m)   Wt 73.9 kg   SpO2 98%   BMI 25.53 kg/m  Physical Exam Vitals and nursing note reviewed.  Constitutional:      Appearance: He is well-developed.  HENT:     Head: Normocephalic and atraumatic.     Mouth/Throat:     Mouth: Mucous membranes are moist.     Pharynx: Oropharynx is clear.  Cardiovascular:     Rate and Rhythm: Normal rate.  Pulmonary:     Effort: Pulmonary effort is normal. No respiratory distress.  Abdominal:     General: There is no distension.  Musculoskeletal:        General: Normal range of motion.     Cervical back: Normal range of motion.  Neurological:     General: No focal deficit present.     Mental Status: He is alert.     ED Results / Procedures / Treatments   Labs (all labs  ordered are listed, but only abnormal results are displayed) Labs Reviewed  CBC WITH DIFFERENTIAL/PLATELET - Abnormal; Notable for the following components:      Result Value   RBC 4.13 (*)    All other components within normal limits  COMPREHENSIVE METABOLIC PANEL - Abnormal; Notable for the following components:   CO2 19 (*)    Glucose, Bld 100 (*)    BUN 26 (*)    Calcium 8.8 (*)    All other components within normal limits  BRAIN NATRIURETIC PEPTIDE - Abnormal; Notable for the following components:   B Natriuretic Peptide 112.0 (*)    All other components within normal limits  TROPONIN I (HIGH SENSITIVITY) - Abnormal; Notable for the following components:   Troponin I (High Sensitivity) 23 (*)    All other components within normal limits  TROPONIN I (HIGH SENSITIVITY) - Abnormal; Notable  for the following components:   Troponin I (High Sensitivity) 22 (*)    All other components within normal limits  LIPASE, BLOOD    EKG None  Radiology DG Chest 2 View  Result Date: 09/07/2022 CLINICAL DATA:  Chest pain EXAM: CHEST - 2 VIEW COMPARISON:  07/23/2020 FINDINGS: Cardiac shadow is within normal limits. Lungs are well aerated bilaterally. Old healed rib fractures are noted on right. No acute abnormality noted. IMPRESSION: No active cardiopulmonary disease. Electronically Signed   By: Inez Catalina M.D.   On: 09/07/2022 01:05    Procedures Procedures    Medications Ordered in ED Medications - No data to display  ED Course/ Medical Decision Making/ A&P                             Medical Decision Making Amount and/or Complexity of Data Reviewed Labs: ordered. Radiology: ordered.   Unclear etiology. Workup for ACS negative here. Low suspcion for PE or heart failure. VS WNL. Will dc with suggested PCP follow up for further evaluation of symptoms.   Final Clinical Impression(s) / ED Diagnoses Final diagnoses:  Chest pain, unspecified type    Rx / DC Orders ED Discharge Orders     None         Hugo Lybrand, Corene Cornea, MD 09/07/22 [phone]

## 2022-09-07 NOTE — ED Notes (Signed)
Pt has been instructed not to eat or drink anything until cleared by EDP. Pt continues to eat chips and drink Gatorade that he brought from home.

## 2022-09-11 NOTE — Progress Notes (Deleted)
Cardiology Office Note:    Date:  09/11/2022   ID:  Wesley Brennan, DOB 1960/11/06, MRN [id number]  PCP:  Fayrene Helper, [place] Providers Cardiologist:  Carlyle Dolly, MD { Click to update primary MD,subspecialty MD or APP then REFRESH:1}  *** Referring MD: Fayrene Helper, MD   Chief Complaint:  No chief complaint on file. {Click here for Visit Info    :1}    History of Present Illness:   Wesley Brennan is a 62 y.o. male with history of atypical chest pain NST 12/2017 suggest inferior infarct although may be artifact no ischemia LVEF 34% echo 12/2017 LVEF 50% with no wall motion abnormalities, history of abnormal LFTs followed by GI thought to be alcohol related, hyperlipidemia with elevated triglycerides as high as 2200    Patient saw Dr. Domenic Polite 05/2022 with recurrent chest pain and stress test was intermediate risk with probable inferior wall scar EF 41% with global hypokinesis follow-up echo EF 45 to 50% with mild decreased function global hypokinesis grade 1 DD.  No changes made he was supposed to follow-up with Dr. Harl Bowie.    Past Medical History:  Diagnosis Date   Gout    Hypercholesteremia    Hypertension    Substance abuse ([place])    Alcohol, nicotine, h/o cocaine and marijuana   Current Medications: No outpatient medications have been marked as taking for the 09/18/22 encounter (Appointment) with Imogene Burn, PA-C.    Allergies:   Patient has no known allergies.   Social History   Tobacco Use   Smoking status: Every Day    Packs/day: 0.10    Years: 40.00    Total pack years: 4.00    Types: Cigarettes    Last attempt to quit: 02/08/2022    Years since quitting: 0.5   Smokeless tobacco: Never   Tobacco comments:    states not smoking often   Vaping Use   Vaping Use: Never used  Substance Use Topics   Alcohol use: Yes    Comment: 1 40oz beer every day   Drug use: No    Family Hx: The patient's family history includes Cancer  (age of onset: 36) in his mother; Diabetes in his father and mother; Heart disease (age of onset: 8) in his father; Hypertension in his sister; Stroke (age of onset: 78) in his father. There is no history of Colon cancer. He was adopted.  ROS     Physical Exam:    VS:  There were no vitals taken for this visit.    Wt Readings from Last 3 Encounters:  09/07/22 163 lb (73.9 kg)  08/04/22 143 lb (64.9 kg)  05/23/22 145 lb 3.2 oz (65.9 kg)    Physical Exam  GEN: Well nourished, well developed, in no acute distress  HEENT: normal  Neck: no JVD, carotid bruits, or masses Cardiac:RRR; no murmurs, rubs, or gallops  Respiratory:  clear to auscultation bilaterally, normal work of breathing GI: soft, nontender, nondistended, + BS Ext: without cyanosis, clubbing, or edema, Good distal pulses bilaterally MS: no deformity or atrophy  Skin: warm and dry, no rash Neuro:  Alert and Oriented x 3, Strength and sensation are intact Psych: euthymic mood, full affect        EKGs/Labs/Other Test Reviewed:    EKG:  EKG is *** ordered today.  The ekg ordered today demonstrates ***  Recent Labs: 03/10/2022: TSH 1.690 09/07/2022: ALT 23; B Natriuretic Peptide 112.0; BUN 26; Creatinine,  Ser 1.05; Hemoglobin 13.6; Platelets 202; Potassium 3.8; Sodium 137   Recent Lipid Panel Recent Labs    03/10/22 0906  CHOL 273*  TRIG 90  HDL 87  LDLCALC 171*     Prior CV Studies: {Select studies to display:26339}  NST 11/23  Findings are consistent with prior myocardial infarction. The study is intermediate risk.   No ST deviation was noted. The ECG was negative for ischemia.  Low risk Duke treadmill score of 5.   LV perfusion is abnormal.  Moderate sized, moderate intensity, inferior defect that is fixed, more prominent on rest managing and suggestive of either scar versus attenuation.   Left ventricular function is abnormal. Nuclear stress EF: 41 %.  Relative global hypokinesis.   Intermediate risk  study with probable inferior wall scar (attenuation not excluded) and LVEF calculated at 41% with relative global hypokinesis.  Suggest corroboration with echocardiography.  2D echo 05/2022 IMPRESSIONS     1. Left ventricular ejection fraction, by estimation, is 45 to 50%. The  left ventricle has mildly decreased function. The left ventricle  demonstrates global hypokinesis. The left ventricular internal cavity size  was mildly dilated. Left ventricular  diastolic parameters are consistent with Grade I diastolic dysfunction  (impaired relaxation).   2. Right ventricular systolic function is normal. The right ventricular  size is normal. Tricuspid regurgitation signal is inadequate for assessing  PA pressure.   3. The mitral valve is grossly normal. Trivial mitral valve  regurgitation.   4. The aortic valve is tricuspid. Aortic valve regurgitation is not  visualized.   5. The inferior vena cava is normal in size with greater than 50%  respiratory variability, suggesting right atrial pressure of 3 mmHg.   Comparison(s): Prior images unable to be directly viewed.   Exercise Myoview 01/07/2018: Blood pressure demonstrated a hypertensive response to exercise. Exercise stopped due to high bp's, peak exercise does not reflect patients exercise capacity. There was no ST segment deviation noted during stress. 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 There is adjacent gut radiotracer uptake in this area, however the wall motion appears hypokinetic suggesting true prior infarct. No current ischemia This is a high risk study. High risk based on decreased LVEF. There is no current myocardium at jeopardy. Recommend correlating with echo. The left ventricular ejection fraction is moderately decreased (34%).   Echocardiogram 01/07/2018: - Left ventricle: The cavity size was normal. Wall thickness was    normal. Systolic function was normal. The estimated ejection     fraction was = 50%. Wall motion was normal; there were no    regional wall motion abnormalities. The study is not technically    sufficient to allow evaluation of LV diastolic function.  - Aortic valve: Valve area (VTI): 1.8 cm^2. Valve area (Vmax): 2.18    cm^2.  - Atrial septum: No defect or patent foramen ovale was identified.  - Technically adequate study.    Carotid Dopplers 03/29/2022: IMPRESSION: Color duplex indicates minimal homogeneous plaque, with no hemodynamically significant stenosis by duplex criteria in the extracranial cerebrovascular circulation.    Risk Assessment/Calculations/Metrics:   {Does this patient have ATRIAL FIBRILLATION?:[id number]}     No BP recorded.  {Refresh Note OR Click here to enter BP  :1}***    ASSESSMENT & PLAN:   No problem-specific Assessment & Plan notes found for this encounter.   Long history of chest pain with intermediate risk NST 05/2022 due to low EF follow-up echo LVEF 45 to 50% with global hypokinesis  HTN  HLD  History of substance abuse        {Are you ordering a CV Procedure (e.g. stress test, cath, DCCV, TEE, etc)?   Press F2        :[id number]   Dispo:  No follow-ups on file.   Medication Adjustments/Labs and Tests Ordered: Current medicines are reviewed at length with the patient today.  Concerns regarding medicines are outlined above.  Tests Ordered: No orders of the defined types were placed in this encounter.  Medication Changes: No orders of the defined types were placed in this encounter.  Sumner Boast, PA-C  09/11/2022 2:34 PM    [address], [place], [place]  [address] Phone: [phone]; Fax: [phone]

## 2022-09-18 ENCOUNTER — Ambulatory Visit: Payer: 59 | Attending: Physician Assistant | Admitting: Physician Assistant

## 2022-09-19 ENCOUNTER — Encounter: Payer: Self-pay | Admitting: Physician Assistant

## 2022-09-21 ENCOUNTER — Encounter: Payer: Self-pay | Admitting: Physician Assistant

## 2022-10-11 ENCOUNTER — Ambulatory Visit: Payer: 59 | Admitting: Cardiology

## 2022-10-11 NOTE — Progress Notes (Deleted)
Clinical Summary Mr. Vanwagoner is a 62 y.o.male   seen today for follow up of the following medical problems.      1. Chest pain - admit 12/2017 with atypical chest pain.   - described at that time as sharp pain left chest, can be up to 10/10. Can have some chills associated, no other symptoms. Can last up to 2-3 hours constantly. Can be positional, worst with laying on left side, better with laying on right side - very physically active, rides his bike up to 2 miles a day, had no exertional symptoms. Uses pushing mower without symptoms.  - trops negative, EKG nonspecific changes - 12/2017 nuclear stress suggests inferior infarct though may be artifact, no ischemia. LVEF 34% - 12/2017 echo LVEF 50%, no WMAs     - denies any recent symptoms     05/2022 echo: LVEF 45-50%, grade I dd, normal RV function 05/2022 nuclear stress: Moderate sized, moderate intensity, inferior defect that is fixed, more prominent on rest managing and suggestive of either scar versus attenuation.   - ER visit 08/2022 with chest pain, negative eval for ACS    2. Abnormal LFTs - followed by GI, thought to be EtOH related - by 09/2019 labs LFTs had normalized.        3. Hyperlipidemia - elevated TGs 2200, collected 2/9 at 730AM. I have confimred with him this was a fasting sample - working to wean [facility], previously was using regularly.  03/2019 TC 233 HDL 45 TG 1500   - last visit started vascepa 2g bid. Avoided fenofibrate due to elevated LFTs at the time.  - continuing to drink though he reports lower amounts.      Past Medical History:  Diagnosis Date   Gout    Hypercholesteremia    Hypertension    Substance abuse ([facility])    Alcohol, nicotine, h/o cocaine and marijuana     No Known Allergies   Current Outpatient Medications  Medication Sig Dispense Refill   rosuvastatin (CRESTOR) 40 MG tablet Take 1 tablet (40 mg total) by mouth daily. 90 tablet 3   acetaminophen (TYLENOL) 500 MG tablet  Take 1,000 mg by mouth every 6 (six) hours as needed for moderate pain.     alfuzosin (UROXATRAL) 10 MG 24 hr tablet Take 1 tablet (10 mg total) by mouth at bedtime. 30 tablet 11   allopurinol (ZYLOPRIM) 300 MG tablet Take 300 mg by mouth daily.     amLODipine (NORVASC) 2.5 MG tablet Take 1 tablet (2.5 mg total) by mouth daily. 90 tablet 1   aspirin EC 81 MG tablet Take 1 tablet (81 mg total) by mouth daily. Swallow whole. 90 tablet 1   Doxylamine-Phenylephrine-APAP ([facility] SINEX DAYQUIL/NYQUIL PO) Take 1 Dose by mouth 2 (two) times daily as needed (cold symptoms).     ergocalciferol (VITAMIN D2) 1.25 MG (50000 UT) capsule Take 1 capsule (50,000 Units total) by mouth once a week. One capsule once weekly (Patient not taking: Reported on 08/01/2022) 12 capsule 2   Multiple Vitamins-Minerals (ADULT GUMMY PO) Take 1 capsule by mouth daily.     pantoprazole (PROTONIX) 20 MG tablet Take 1 tablet (20 mg total) by mouth daily. 30 tablet 1   rosuvastatin (CRESTOR) 20 MG tablet Take 20 mg by mouth daily.     TURMERIC PO Take 1 tablet by mouth daily.     No current facility-administered medications for this visit.     Past Surgical History:  Procedure  Laterality Date   COLONOSCOPY N/A 07/02/2015   Procedure: COLONOSCOPY;  Surgeon: Danie Binder, MD;  [facility];  Service: Endoscopy;  Laterality: N/A;  1:00 PM   COLONOSCOPY WITH PROPOFOL N/A 03/27/2019   Procedure: COLONOSCOPY WITH PROPOFOL;  Surgeon: Danie Binder, MD;  two 2-4 mm polyps, one 12 mm polyp with clips placed, and tortuous left colon.  Pathology with tubular adenomas.  Next colonoscopy in 2023.    COLONOSCOPY WITH PROPOFOL N/A 08/04/2022   Procedure: COLONOSCOPY WITH PROPOFOL;  Surgeon: Eloise Harman, DO;  [facility];  Service: Endoscopy;  Laterality: N/A;  12:30 pm  ASA 2   ELBOW FUSION  1993   left elbow dislocation   ELBOW FUSION Left 1979   HERNIA REPAIR  [id number] approx   umbilical hernia   POLYPECTOMY   03/27/2019   Procedure: POLYPECTOMY;  Surgeon: Danie Binder, MD;  [facility]: [facility];  Service: Endoscopy;;   POLYPECTOMY  08/04/2022   Procedure: POLYPECTOMY INTESTINAL;  Surgeon: Eloise Harman, DO;  [facility];  Service: Endoscopy;;   TONSILLECTOMY       No Known Allergies    Family History  Adopted: Yes  Problem Relation Age of Onset   Cancer Mother 64       Bone    Diabetes Mother    Hypertension Sister    Stroke Father 83   Heart disease Father 72   Diabetes Father    Colon cancer Neg Hx      Social History Mr. Ambroise reports that he has been smoking cigarettes. He has a 4.00 pack-year smoking history. He has never used smokeless tobacco. Mr. Mccullar reports current alcohol use.   Review of Systems CONSTITUTIONAL: No weight loss, fever, chills, weakness or fatigue.  HEENT: Eyes: No visual loss, blurred vision, double vision or yellow sclerae.No hearing loss, sneezing, congestion, runny nose or sore throat.  SKIN: No rash or itching.  CARDIOVASCULAR:  RESPIRATORY: No shortness of breath, cough or sputum.  GASTROINTESTINAL: No anorexia, nausea, vomiting or diarrhea. No abdominal pain or blood.  GENITOURINARY: No burning on urination, no polyuria NEUROLOGICAL: No headache, dizziness, syncope, paralysis, ataxia, numbness or tingling in the extremities. No change in bowel or bladder control.  MUSCULOSKELETAL: No muscle, back pain, joint pain or stiffness.  LYMPHATICS: No enlarged nodes. No history of splenectomy.  PSYCHIATRIC: No history of depression or anxiety.  ENDOCRINOLOGIC: No reports of sweating, cold or heat intolerance. No polyuria or polydipsia.  Marland Kitchen   Physical Examination There were no vitals filed for this visit. There were no vitals filed for this visit.  Gen: resting comfortably, no acute distress HEENT: no scleral icterus, pupils equal round and reactive, no palptable cervical adenopathy,  CV Resp: Clear to auscultation  bilaterally GI: abdomen is soft, non-tender, non-distended, normal bowel sounds, no hepatosplenomegaly MSK: extremities are warm, no edema.  Skin: warm, no rash Neuro:  no focal deficits Psych: appropriate affect   Diagnostic Studies  05/2022 echo  1. Left ventricular ejection fraction, by estimation, is 45 to 50%. The  left ventricle has mildly decreased function. The left ventricle  demonstrates global hypokinesis. The left ventricular internal cavity size  was mildly dilated. Left ventricular  diastolic parameters are consistent with Grade I diastolic dysfunction  (impaired relaxation).   2. Right ventricular systolic function is normal. The right ventricular  size is normal. Tricuspid regurgitation signal is inadequate for assessing  PA pressure.   3. The mitral  valve is grossly normal. Trivial mitral valve  regurgitation.   4. The aortic valve is tricuspid. Aortic valve regurgitation is not  visualized.   5. The inferior vena cava is normal in size with greater than 50%  respiratory variability, suggesting right atrial pressure of 3 mmHg.   Assessment and Plan   1. Chest pain - atypical symptoms that have resolved. Probable artifact on nuclear stress test, no evidence of ischemia. Echo with normal LVEF and no WMAs. Tolerates high levels of exertion - no recurrent symptoms, continue to monitor.      2. Hyperlipidemia - likely related to heavy EtOH use he is weaning - severely elevated TGs, started on vascepa - repeat lipid panel. If LFTs remain normal could add fenofibrate.      Arnoldo Lenis, M.D., F.A.C.C.

## 2022-10-17 ENCOUNTER — Encounter: Payer: Self-pay | Admitting: Family Medicine

## 2022-10-17 ENCOUNTER — Ambulatory Visit (INDEPENDENT_AMBULATORY_CARE_PROVIDER_SITE_OTHER): Payer: 59 | Admitting: Family Medicine

## 2022-10-17 VITALS — BP 136/78 | HR 82 | Ht 67.0 in | Wt 150.0 lb

## 2022-10-17 DIAGNOSIS — I10 Essential (primary) hypertension: Secondary | ICD-10-CM

## 2022-10-17 DIAGNOSIS — H60391 Other infective otitis externa, right ear: Secondary | ICD-10-CM | POA: Diagnosis not present

## 2022-10-17 DIAGNOSIS — F10288 Alcohol dependence with other alcohol-induced disorder: Secondary | ICD-10-CM

## 2022-10-17 DIAGNOSIS — E790 Hyperuricemia without signs of inflammatory arthritis and tophaceous disease: Secondary | ICD-10-CM | POA: Diagnosis not present

## 2022-10-17 DIAGNOSIS — L723 Sebaceous cyst: Secondary | ICD-10-CM | POA: Diagnosis not present

## 2022-10-17 DIAGNOSIS — F17210 Nicotine dependence, cigarettes, uncomplicated: Secondary | ICD-10-CM

## 2022-10-17 DIAGNOSIS — F17200 Nicotine dependence, unspecified, uncomplicated: Secondary | ICD-10-CM

## 2022-10-17 DIAGNOSIS — E785 Hyperlipidemia, unspecified: Secondary | ICD-10-CM | POA: Diagnosis not present

## 2022-10-17 MED ORDER — CIPROFLOXACIN-DEXAMETHASONE 0.3-0.1 % OT SUSP: 4.0000 [drp] | Freq: Two times a day (BID) | OTIC | 0 refills | Status: AC

## 2022-10-17 MED ORDER — ERGOCALCIFEROL 1.25 MG (50000 UT) PO CAPS: 50000.0000 [IU] | ORAL_CAPSULE | ORAL | 2 refills | Status: AC

## 2022-10-17 NOTE — Patient Instructions (Addendum)
F/U in  4 months, call if you need me sooner  Need to take BP med EVERY day,  Need to quit all alcohol  Nurse pls refer for lung cancer screening has over 20 pack year history, quit in 07/2022  Drops sent for right ear, use for 5 days , then as needed  Need to reschedule and keep appt with cardiology  Right outer ear is  inflamed. Do not stck anything in it, and use the ear drops  Bump on chest is cyst, do not squeeze, leave it alone  Labs today lipid, cmp and EGFr  and uric acid level  Thanks for choosing [facility], we consider it a privelige to serve you.

## 2022-10-18 MED ORDER — ROSUVASTATIN CALCIUM 40 MG PO TABS: 40.0000 mg | ORAL_TABLET | Freq: Every day | ORAL | 3 refills | Status: DC

## 2022-10-18 MED ORDER — FENOFIBRATE 48 MG PO TABS: 48.0000 mg | ORAL_TABLET | Freq: Every day | ORAL | 2 refills | Status: AC

## 2022-10-19 LAB — CMP14+EGFR
ALT: 24 IU/L (ref 0–44)
AST: 38 IU/L (ref 0–40)
Albumin/Globulin Ratio: 1.6 (ref 1.2–2.2)
Albumin: 4.4 g/dL (ref 3.9–4.9)
Alkaline Phosphatase: 107 IU/L (ref 44–121)
BUN/Creatinine Ratio: 11 (ref 10–24)
BUN: 13 mg/dL (ref 8–27)
Bilirubin Total: 0.5 mg/dL (ref 0.0–1.2)
CO2: 22 mmol/L (ref 20–29)
Calcium: 9.5 mg/dL (ref 8.6–10.2)
Chloride: 108 mmol/L — ABNORMAL HIGH (ref 96–106)
Creatinine, Ser: 1.17 mg/dL (ref 0.76–1.27)
Globulin, Total: 2.7 g/dL (ref 1.5–4.5)
Glucose: 90 mg/dL (ref 70–99)
Potassium: 4.4 mmol/L (ref 3.5–5.2)
Sodium: 146 mmol/L — ABNORMAL HIGH (ref 134–144)
Total Protein: 7.1 g/dL (ref 6.0–8.5)
eGFR: 70 mL/min/{1.73_m2} (ref >59)

## 2022-10-19 LAB — LIPID PANEL
Chol/HDL Ratio: 2.2 ratio (ref 0.0–5.0)
Cholesterol, Total: 249 mg/dL — ABNORMAL HIGH (ref 100–199)
HDL: 114 mg/dL (ref >39)
LDL Chol Calc (NIH): 108 mg/dL — ABNORMAL HIGH (ref 0–99)
Triglycerides: 165 mg/dL — ABNORMAL HIGH (ref 0–149)
VLDL Cholesterol Cal: 27 mg/dL (ref 5–40)

## 2022-10-19 LAB — URIC ACID: Uric Acid: 7 mg/dL (ref 3.8–8.4)

## 2022-10-22 ENCOUNTER — Encounter: Payer: Self-pay | Admitting: Family Medicine

## 2022-10-22 DIAGNOSIS — L723 Sebaceous cyst: Secondary | ICD-10-CM | POA: Insufficient documentation

## 2022-10-22 DIAGNOSIS — H6091 Unspecified otitis externa, right ear: Secondary | ICD-10-CM | POA: Insufficient documentation

## 2022-10-22 NOTE — Assessment & Plan Note (Signed)
Uncompkicated cyston ant chest , material extruded with entle pressure

## 2022-10-22 NOTE — Progress Notes (Signed)
Wesley Brennan     MRN: [id number]      DOB: 1961-05-25   HPI Mr. Holstad is here for follow up and re-evaluation of chronic medical conditions, medication management and review of any available recent lab and radiology data.  Preventive health is updated, specifically  Cancer screening and Immunization.   Questions or concerns regarding consultations or procedures which the PT has had in the interim are  addressed. The PT denies any adverse reactions to current medications since the last visit.  C/o right ear pressure with ringing, feels needs flushing C/o enlarging lump on chest  ROS Denies recent fever or chills. Denies sinus pressure, nasal congestion,  or sore throat. Denies chest congestion, productive cough or wheezing. Denies chest pains, palpitations and leg swelling Denies abdominal pain, nausea, vomiting,diarrhea or constipation.   Denies dysuria, frequency, hesitancy or incontinence. Denies joint pain, swelling and limitation in mobility. Denies headaches, seizures, numbness, or tingling. Denies depression, anxiety or insomnia. Still smoking and drinking, reports less  PE  BP 136/78 (BP Location: Left Arm, Patient Position: Sitting, Cuff Size: Normal)   Pulse 82   Ht 5\' 7"  (1.702 m)   Wt 150 lb 0.6 oz (68.1 kg)   SpO2 97%   BMI 23.50 kg/m   Patient alert and oriented and in no cardiopulmonary distress.  HEENT: No facial asymmetry, EOMI,     Neck supple .TM clear, no impaction, erythema and edema of right external canal  Chest: Clear to auscultation bilaterally.  CVS: S1, S2 no murmurs, no S3.Regular rate.  ABD: Soft non tender.   Ext: No edema  MS: Adequate ROM spine, shoulders, hips and knees.  Skin: Intact, no ulcerations or rash noted.Sebaceous cyst on abterior chest, material extruded with gentle pressure  Psych: Good eye contact, normal affect. Memory intact not anxious or depressed appearing.  CNS: CN 2-12 intact, power,  normal throughout.no focal  deficits noted.   Assessment & Plan  Essential hypertension Needs improved control DASH diet and commitment to daily physical activity for a minimum of 30 minutes discussed and encouraged, as a part of hypertension management. The importance of attaining a healthy weight is also discussed.     10/17/2022   10:40 AM 10/17/2022   10:39 AM 09/07/2022    4:00 AM 09/07/2022    2:00 AM 09/07/2022    1:00 AM 09/07/2022   12:17 AM 09/07/2022   12:16 AM  BP/Weight  Systolic BP 136 150 123 115 130 136   Diastolic BP 78 76 76 66 82 83   Wt. (Lbs)  150.04     163  BMI  23.5 kg/m2     25.53 kg/m2     Needs to  take med consistently  Smoking greater than 30 pack years Needs annual screening  NICOTINE ADDICTION Asked:confirms currently smokes cigarettes Assess: Unwilling to set a quit date, but is cutting back Advise: needs to QUIT to reduce risk of cancer, cardio and cerebrovascular disease Assist: counseled for 5 minutes and literature provided Arrange: follow up in 2 to 4 months   Alcohol dependence (HCC) Needs to quit, still repots drinking but states less, LFT currently normal  Hyperlipidemia LDL goal <100 Hyperlipidemia:Low fat diet discussed and encouraged.   Lipid Panel  Lab Results  Component Value Date   CHOL 249 (H) 10/17/2022   HDL 114 10/17/2022   LDLCALC 108 (H) 10/17/2022   TRIG 165 (H) 10/17/2022   CHOLHDL 2.2 10/17/2022     Improving , reduce  fat in diet and add fenofibrate  Sebaceous cyst Uncompkicated cyston ant chest , material extruded with entle pressure  Right otitis externa Ciprodex prescribed for 1 week , then as needed, encouraged to d/c scratching the ear

## 2022-10-22 NOTE — Assessment & Plan Note (Signed)
Asked:confirms currently smokes cigarettes °Assess: Unwilling to set a quit date, but is cutting back °Advise: needs to QUIT to reduce risk of cancer, cardio and cerebrovascular disease °Assist: counseled for 5 minutes and literature provided °Arrange: follow up in 2 to 4 months ° °

## 2022-10-22 NOTE — Assessment & Plan Note (Signed)
Ciprodex prescribed for 1 week , then as needed, encouraged to d/c scratching the ear

## 2022-10-22 NOTE — Assessment & Plan Note (Signed)
Hyperlipidemia:Low fat diet discussed and encouraged.   Lipid Panel  Lab Results  Component Value Date   CHOL 249 (H) 10/17/2022   HDL 114 10/17/2022   LDLCALC 108 (H) 10/17/2022   TRIG 165 (H) 10/17/2022   CHOLHDL 2.2 10/17/2022     Improving , reduce fat in diet and add fenofibrate

## 2022-10-22 NOTE — Assessment & Plan Note (Signed)
Needs to quit, still repots drinking but states less, LFT currently normal

## 2022-10-22 NOTE — Assessment & Plan Note (Signed)
Needs annual screening

## 2022-10-22 NOTE — Assessment & Plan Note (Signed)
Needs improved control DASH diet and commitment to daily physical activity for a minimum of 30 minutes discussed and encouraged, as a part of hypertension management. The importance of attaining a healthy weight is also discussed.     10/17/2022   10:40 AM 10/17/2022   10:39 AM 09/07/2022    4:00 AM 09/07/2022    2:00 AM 09/07/2022    1:00 AM 09/07/2022   12:17 AM 09/07/2022   12:16 AM  BP/Weight  Systolic BP 136 150 123 115 130 136   Diastolic BP 78 76 76 66 82 83   Wt. (Lbs)  150.04     163  BMI  23.5 kg/m2     25.53 kg/m2     Needs to  take med consistently

## 2022-10-29 ENCOUNTER — Emergency Department (HOSPITAL_COMMUNITY)
Admission: EM | Admit: 2022-10-29 | Discharge: 2022-10-29 | Disposition: A | Discharge status: Home / Self Care | Payer: 59 | Attending: Emergency Medicine | Admitting: Emergency Medicine

## 2022-10-29 ENCOUNTER — Encounter (HOSPITAL_COMMUNITY): Payer: Self-pay | Admitting: *Deleted

## 2022-10-29 ENCOUNTER — Other Ambulatory Visit: Payer: Self-pay

## 2022-10-29 DIAGNOSIS — Z79899 Other long term (current) drug therapy: Secondary | ICD-10-CM | POA: Diagnosis not present

## 2022-10-29 DIAGNOSIS — H8309 Labyrinthitis, unspecified ear: Secondary | ICD-10-CM | POA: Insufficient documentation

## 2022-10-29 DIAGNOSIS — Z7982 Long term (current) use of aspirin: Secondary | ICD-10-CM | POA: Insufficient documentation

## 2022-10-29 DIAGNOSIS — R42 Dizziness and giddiness: Secondary | ICD-10-CM

## 2022-10-29 DIAGNOSIS — I10 Essential (primary) hypertension: Secondary | ICD-10-CM | POA: Diagnosis not present

## 2022-10-29 LAB — CBC WITH DIFFERENTIAL/PLATELET
Abs Immature Granulocytes: 0.01 10*3/uL (ref 0.00–0.07)
Basophils Absolute: 0 10*3/uL (ref 0.0–0.1)
Basophils Relative: 1 %
Eosinophils Absolute: 0.2 10*3/uL (ref 0.0–0.5)
Eosinophils Relative: 5 %
HCT: 44.7 % (ref 39.0–52.0)
Hemoglobin: 14.5 g/dL (ref 13.0–17.0)
Immature Granulocytes: 0 %
Lymphocytes Relative: 48 %
Lymphs Abs: 2.2 10*3/uL (ref 0.7–4.0)
MCH: 33.1 pg (ref 26.0–34.0)
MCHC: 32.4 g/dL (ref 30.0–36.0)
MCV: 102.1 fL — ABNORMAL HIGH (ref 80.0–100.0)
Monocytes Absolute: 0.5 10*3/uL (ref 0.1–1.0)
Monocytes Relative: 10 %
Neutro Abs: 1.6 10*3/uL — ABNORMAL LOW (ref 1.7–7.7)
Neutrophils Relative %: 36 %
Platelets: 209 10*3/uL (ref 150–400)
RBC: 4.38 MIL/uL (ref 4.22–5.81)
RDW: 12.6 % (ref 11.5–15.5)
WBC: 4.5 10*3/uL (ref 4.0–10.5)
nRBC: 0 % (ref 0.0–0.2)

## 2022-10-29 LAB — BASIC METABOLIC PANEL
Anion gap: 9 (ref 5–15)
BUN: 26 mg/dL — ABNORMAL HIGH (ref 8–23)
CO2: 23 mmol/L (ref 22–32)
Calcium: 9.1 mg/dL (ref 8.9–10.3)
Chloride: 107 mmol/L (ref 98–111)
Creatinine, Ser: 1.23 mg/dL (ref 0.61–1.24)
GFR, Estimated: >60 mL/min (ref >60)
Glucose, Bld: 122 mg/dL — ABNORMAL HIGH (ref 70–99)
Potassium: 4.5 mmol/L (ref 3.5–5.1)
Sodium: 139 mmol/L (ref 135–145)

## 2022-10-29 MED ORDER — PREDNISONE 20 MG PO TABS
40.0000 mg | ORAL_TABLET | Freq: Once | ORAL | Status: AC
  Administered 2022-10-29: 40 mg via ORAL
  Filled 2022-10-29: qty 2

## 2022-10-29 MED ORDER — MECLIZINE HCL 25 MG PO TABS: 25.0000 mg | ORAL_TABLET | Freq: Three times a day (TID) | ORAL | 0 refills | Status: AC | PRN

## 2022-10-29 MED ORDER — PREDNISONE 20 MG PO TABS: 40.0000 mg | ORAL_TABLET | Freq: Every day | ORAL | 0 refills | Status: AC

## 2022-10-29 MED ORDER — ONDANSETRON 4 MG PO TBDP: 4.0000 mg | ORAL_TABLET | Freq: Three times a day (TID) | ORAL | 0 refills | Status: AC | PRN

## 2022-10-29 NOTE — ED Notes (Signed)
Amb from triage to room with no difficulty.  Steady gait

## 2022-10-29 NOTE — ED Triage Notes (Signed)
Pt states he woke up with dizziness this morning, pt states he fell and knocked his tv over this morning. Still feels dizziness.  Hx of same per pt.

## 2022-10-29 NOTE — ED Provider Notes (Signed)
[place] EMERGENCY DEPARTMENT AT [facility] Provider Note   CSN: [id number] Arrival date & time: 10/29/22  1731     History  Chief Complaint  Patient presents with   Dizziness    Wesley Brennan is a 62 y.o. male.   Dizziness  This patient is a 62 year old male, history of hypertension, history of intermittent vertigo, presents to the hospital today with a complaint of recurrent vertigo that occurred this morning, he states it is associated when he stands up goes away when he lays down and has a ringing in the ears associated with this.  No fevers or chills or nausea or vomiting or diarrhea, no difficulty breathing or chest pain, no swelling of the legs.  He has had this happen in the past as well    Home Medications Prior to Admission medications   Medication Sig Start Date End Date Taking? Authorizing Provider  meclizine (ANTIVERT) 25 MG tablet Take 1 tablet (25 mg total) by mouth 3 (three) times daily as needed for dizziness. 10/29/22  Yes Wesley Hong, MD  ondansetron (ZOFRAN-ODT) 4 MG disintegrating tablet Take 1 tablet (4 mg total) by mouth every 8 (eight) hours as needed for nausea. 10/29/22  Yes Wesley Hong, MD  predniSONE (DELTASONE) 20 MG tablet Take 2 tablets (40 mg total) by mouth daily. 10/29/22  Yes Wesley Hong, MD  acetaminophen (TYLENOL) 500 MG tablet Take 1,000 mg by mouth every 6 (six) hours as needed for moderate pain.    [provider]  alfuzosin (UROXATRAL) 10 MG 24 hr tablet Take 1 tablet (10 mg total) by mouth at bedtime. 03/15/22   McKenzie, Mardene Celeste, MD  allopurinol (ZYLOPRIM) 300 MG tablet Take 300 mg by mouth daily.    [provider]  amLODipine (NORVASC) 2.5 MG tablet Take 1 tablet (2.5 mg total) by mouth daily. 03/10/22   Kerri Perches, MD  aspirin EC 81 MG tablet Take 1 tablet (81 mg total) by mouth daily. Swallow whole. 03/10/22   Kerri Perches, MD  ciprofloxacin-dexamethasone (CIPRODEX) OTIC suspension Place  4 drops into the right ear 2 (two) times daily. 10/17/22   Kerri Perches, MD  Doxylamine-Phenylephrine-APAP [facility] SINEX DAYQUIL/NYQUIL PO) Take 1 Dose by mouth 2 (two) times daily as needed (cold symptoms).    [provider]  ergocalciferol (VITAMIN D2) 1.25 MG (50000 UT) capsule Take 1 capsule (50,000 Units total) by mouth once a week. One capsule once weekly 10/17/22   Kerri Perches, MD  fenofibrate (TRICOR) 48 MG tablet Take 1 tablet (48 mg total) by mouth daily. 10/18/22   Kerri Perches, MD  Multiple Vitamins-Minerals (ADULT GUMMY PO) Take 1 capsule by mouth daily.    [provider]  pantoprazole (PROTONIX) 20 MG tablet Take 1 tablet (20 mg total) by mouth daily. 03/10/22   Kerri Perches, MD  rosuvastatin (CRESTOR) 40 MG tablet Take 1 tablet (40 mg total) by mouth daily. 10/18/22   Kerri Perches, MD  TURMERIC PO Take 1 tablet by mouth daily.    [provider]      Allergies    Patient has no known allergies.    Review of Systems   Review of Systems  Neurological:  Positive for dizziness.  All other systems reviewed and are negative.   Physical Exam Updated Vital Signs BP 115/67 (BP Location: Right Arm)   Pulse 77   Temp 97.9 F (36.6 C)   Resp 18   Ht 1.702  m (5\' 7" )   Wt 68 kg   SpO2 97%   BMI 23.49 kg/m  Physical Exam Vitals and nursing note reviewed.  Constitutional:      General: He is not in acute distress.    Appearance: He is well-developed.  HENT:     Head: Normocephalic and atraumatic.     Comments: Tympanic membrane's visualized and normal bilaterally    Mouth/Throat:     Pharynx: No oropharyngeal exudate.  Eyes:     General: No scleral icterus.       Right eye: No discharge.        Left eye: No discharge.     Conjunctiva/sclera: Conjunctivae normal.     Pupils: Pupils are equal, round, and reactive to light.  Neck:     Thyroid: No thyromegaly.     Vascular: No JVD.  Cardiovascular:     Rate and  Rhythm: Normal rate and regular rhythm.     Heart sounds: Normal heart sounds. No murmur heard.    No friction rub. No gallop.  Pulmonary:     Effort: Pulmonary effort is normal. No respiratory distress.     Breath sounds: Normal breath sounds. No wheezing or rales.  Abdominal:     General: Bowel sounds are normal. There is no distension.     Palpations: Abdomen is soft. There is no mass.     Tenderness: There is no abdominal tenderness.  Musculoskeletal:        General: No tenderness. Normal range of motion.     Cervical back: Normal range of motion and neck supple.  Lymphadenopathy:     Cervical: No cervical adenopathy.  Skin:    General: Skin is warm and dry.     Findings: No erythema or rash.  Neurological:     Mental Status: He is alert.     Coordination: Coordination normal.     Comments: Speech is clear, cranial nerves III through XII are intact, memory is intact, strength is normal in all 4 extremities including grips, sensation is intact to light touch and pinprick in all 4 extremities. Coordination as tested by finger-nose-finger is normal, no limb ataxia. Normal gait, normal reflexes at the patellar tendons bilaterally, vertigo is induced when the patient stands, it is mild  Psychiatric:        Behavior: Behavior normal.     ED Results / Procedures / Treatments   Labs (all labs ordered are listed, but only abnormal results are displayed) Labs Reviewed  BASIC METABOLIC PANEL - Abnormal; Notable for the following components:      Result Value   Glucose, Bld 122 (*)    BUN 26 (*)    All other components within normal limits  CBC WITH DIFFERENTIAL/PLATELET - Abnormal; Notable for the following components:   MCV 102.1 (*)    Neutro Abs 1.6 (*)    All other components within normal limits    EKG EKG Interpretation  Date/Time:  Sunday October 29 2022 19:46:55 EDT Ventricular Rate:  62 PR Interval:  177 QRS Duration: 101 QT Interval:  424 QTC Calculation: 431 R  Axis:   17 Text Interpretation: Sinus rhythm LVH with secondary repolarization abnormality Probable anterior infarct, age indeterminate Baseline wander in lead(s) II III aVF V1 V3 since last tracing no significant change Confirmed by Wesley Brennan ([id number]) on 10/29/2022 8:41:22 PM  Radiology No results found.  Procedures Procedures    Medications Ordered in ED Medications  predniSONE (DELTASONE) tablet 40 mg (has no  administration in time range)    ED Course/ Medical Decision Making/ A&P                             Medical Decision Making  EKG shows LVH but no other significant acute findings.  The patient's exam is unremarkable, consistent with labyrinthitis or peripheral vertigo.  Patient is agreeable to be discharged on prednisone meclizine and Zofran as needed.  Stable for discharge at this time.  Labs reviewed and unremarkable, EKG unremarkable        Final Clinical Impression(s) / ED Diagnoses Final diagnoses:  Labyrinthitis, unspecified laterality  Vertigo    Rx / DC Orders ED Discharge Orders          Ordered    predniSONE (DELTASONE) 20 MG tablet  Daily        10/29/22 2049    meclizine (ANTIVERT) 25 MG tablet  3 times daily PRN        10/29/22 2049    ondansetron (ZOFRAN-ODT) 4 MG disintegrating tablet  Every 8 hours PRN        10/29/22 2049              Wesley Hong, MD 10/29/22 2049

## 2022-10-29 NOTE — ED Provider Triage Note (Signed)
Emergency Medicine Provider Triage Evaluation Note  Wesley Brennan , a 62 y.o. male  was evaluated in triage.  Pt complains of dizziness.  Patient states he got up from the couch this morning and felt immediately dizzy, falling forward and hitting his TV with his chest.  He denies hitting his head and denies losing consciousness.  He describes both being off balance and also a feeling of the room spinning.  He denies chest pain, shortness of breath, abdominal pain, nausea, vomiting.  Patient also endorses tinnitus  Review of Systems  Positive: As above Negative: As above  Physical Exam  BP 115/67 (BP Location: Right Arm)   Pulse 77   Temp 97.9 F (36.6 C)   Resp 18   Ht 5\' 7"  (1.702 m)   Wt 68 kg   SpO2 97%   BMI 23.49 kg/m  Gen:   Awake, no distress   Resp:  Normal effort  MSK:   Moves extremities without difficulty  Other:    Medical Decision Making  Medically screening exam initiated at 6:10 PM.  Appropriate orders placed.  Wesley Brennan was informed that the remainder of the evaluation will be completed by another provider, this initial triage assessment does not replace that evaluation, and the importance of remaining in the ED until their evaluation is complete.     Wesley Grinder, PA-C 10/29/22 1811

## 2022-10-29 NOTE — Discharge Instructions (Addendum)
Prednisone is a steroid that helps to reduce certain types of inflammation and may be used for allergic reactions, some rashes such as poison ivy or dermatitis, for asthma attacks or bronchitis and for certain types of pain.  Please take this medicine exactly as prescribed - 40mg  by mouth daily for 5 days.  This can have certain side effects with some people including feeling like you can't sleep, feeling anxious or feeling like you are on a "high".  It should not cause weight gain if only taken for a short time.  Please be aware that this medication may also cause an elevation in your blood sugar if you are a diabetic so if you are a diabetic you will need to keep a very close eye on your blood sugar, make sure that you are eating an extremely low level of carbohydrates and taking your medications exactly as prescribed.  If you should develop severe high blood sugar or start to feel poorly return to the emergency department immediately   Meclizine is a medication that can help with vertigo and dizziness.  You may take 1 tablet every 6 hours as needed.  Please make sure that you do not drive when you are taking this medication as it can make you sleepy.  Zofran is a medication which can help with nausea.  You may take 4 mg by mouth every 6 hours as needed if you are an adult, if your child under the age of 6 take half of a tablet or 2 mg every 6 hours as needed.  This should dissolve on your tongue within a short timeframe.  Wait about 30 minutes after taking it to help with drinking clear liquids.  Thank you for allowing [place] to treat you in the emergency department today.  After reviewing your examination and potential testing that was done it appears that you are safe to go home.  I would like for you to follow-up with your doctor within the next several days, have them obtain your results and follow-up with them to review all of these tests.  If you should develop severe or worsening symptoms return to the  emergency department immediately

## 2022-10-31 ENCOUNTER — Telehealth: Payer: Self-pay

## 2022-10-31 NOTE — Telephone Encounter (Signed)
        Patient  visited [facility] on 4/14     Telephone encounter attempt :  1st  A HIPAA compliant voice message was left requesting a return call.  Instructed patient to call back     Wesley Brennan [facility] Guide, [place] [phone] [address], [place], [place] [address] Phone: [phone] Email: [email or web address]       

## 2022-11-01 ENCOUNTER — Telehealth: Payer: Self-pay

## 2022-11-01 ENCOUNTER — Other Ambulatory Visit: Payer: Self-pay

## 2022-11-01 DIAGNOSIS — Z122 Encounter for screening for malignant neoplasm of respiratory organs: Secondary | ICD-10-CM

## 2022-11-01 DIAGNOSIS — Z87891 Personal history of nicotine dependence: Secondary | ICD-10-CM

## 2022-11-01 NOTE — Progress Notes (Signed)
Received referral for initial lung cancer screening scan. Contacted patient and obtained smoking history (started age 63, current smoker continuing to smoke 0.5PPD, 23 pack year) as well as answering questions related to the screening process. Patient denies signs/symptoms of lung cancer such as weight loss or hemoptysis. Patient denies comorbidity that would prevent curative treatment if lung cancer were to be found. Patient is scheduled for shared decision making visit and CT scan on 11/20/22 at 0800.

## 2022-11-01 NOTE — Telephone Encounter (Signed)
     Patient  visit on 4/14  at [facility]  Have you been able to follow up with your primary care physician? Yes   The patient was or was not able to obtain any needed medicine or equipment. Yes   Are there diet recommendations that you are having difficulty following? Na   Patient expresses understanding of discharge instructions and education provided has no other needs at this time.  Yes      Palmer Shorey [facility] Guide, [place] [phone] [address], [place], [place] [address] Phone: [phone] Email: [email or web address]    

## 2022-11-01 NOTE — Progress Notes (Signed)
Order placed for LDCT per protocol °

## 2022-11-20 ENCOUNTER — Inpatient Hospital Stay: Payer: 59 | Attending: Physician Assistant | Admitting: Physician Assistant

## 2022-11-20 ENCOUNTER — Ambulatory Visit (HOSPITAL_COMMUNITY): Admission: RE | Admit: 2022-11-20 | Payer: 59 | Source: Ambulatory Visit

## 2023-01-03 NOTE — Progress Notes (Signed)
Patient no showed for SDMV/LDCT appts. Unable to reach patient to reschedule. Referral closed at this time.

## 2023-02-05 ENCOUNTER — Telehealth: Payer: Self-pay | Admitting: Family Medicine

## 2023-02-05 NOTE — Telephone Encounter (Signed)
Patient has stopped taking his cholesterol medicine, he said can not function with this medication, gets weak , dizziness , is there an alternative medicine for his cholesterol.

## 2023-02-07 NOTE — Telephone Encounter (Signed)
Patient aware.

## 2023-02-16 ENCOUNTER — Ambulatory Visit: Payer: 59 | Admitting: Family Medicine

## 2023-03-23 ENCOUNTER — Encounter: Payer: Self-pay | Admitting: Family Medicine

## 2023-03-23 ENCOUNTER — Ambulatory Visit (INDEPENDENT_AMBULATORY_CARE_PROVIDER_SITE_OTHER): Payer: 59 | Admitting: Family Medicine

## 2023-03-23 VITALS — BP 120/80 | HR 68 | Ht 67.0 in | Wt 147.0 lb

## 2023-03-23 DIAGNOSIS — F17200 Nicotine dependence, unspecified, uncomplicated: Secondary | ICD-10-CM | POA: Diagnosis not present

## 2023-03-23 DIAGNOSIS — I10 Essential (primary) hypertension: Secondary | ICD-10-CM | POA: Diagnosis not present

## 2023-03-23 DIAGNOSIS — E785 Hyperlipidemia, unspecified: Secondary | ICD-10-CM | POA: Diagnosis not present

## 2023-03-23 DIAGNOSIS — F10288 Alcohol dependence with other alcohol-induced disorder: Secondary | ICD-10-CM

## 2023-03-23 DIAGNOSIS — Z23 Encounter for immunization: Secondary | ICD-10-CM

## 2023-03-23 DIAGNOSIS — R052 Subacute cough: Secondary | ICD-10-CM | POA: Diagnosis not present

## 2023-03-23 DIAGNOSIS — L602 Onychogryphosis: Secondary | ICD-10-CM

## 2023-03-23 MED ORDER — BENZONATATE 100 MG PO CAPS: 100.0000 mg | ORAL_CAPSULE | Freq: Two times a day (BID) | ORAL | 0 refills | Status: AC | PRN

## 2023-03-23 MED ORDER — AZITHROMYCIN 250 MG PO TABS: ORAL_TABLET | ORAL | 0 refills | Status: AC

## 2023-03-23 NOTE — Patient Instructions (Addendum)
Annual wellness to be scheduled, overdue  Annual exam with MD in 6 to 10  weeks, please bring meds   Flu vaccine today  Tessalon perle and Z pack prescribed for cough  cBC, lipid, cmp and eGFR today  Need Covid and rSV vaccines , both are at your pharmacy  Thanks for choosing [facility], we consider it a privelige to serve you.

## 2023-03-24 LAB — CBC
Hematocrit: 44 % (ref 37.5–51.0)
Hemoglobin: 14.2 g/dL (ref 13.0–17.7)
MCH: 32.7 pg (ref 26.6–33.0)
MCHC: 32.3 g/dL (ref 31.5–35.7)
MCV: 101 fL — ABNORMAL HIGH (ref 79–97)
Platelets: 340 10*3/uL (ref 150–450)
RBC: 4.34 x10E6/uL (ref 4.14–5.80)
RDW: 11.1 % — ABNORMAL LOW (ref 11.6–15.4)
WBC: 5.2 10*3/uL (ref 3.4–10.8)

## 2023-03-24 LAB — CMP14+EGFR
ALT: 24 IU/L (ref 0–44)
AST: 41 IU/L — ABNORMAL HIGH (ref 0–40)
Albumin: 4.7 g/dL (ref 3.9–4.9)
Alkaline Phosphatase: 88 IU/L (ref 44–121)
BUN/Creatinine Ratio: 20 (ref 10–24)
BUN: 20 mg/dL (ref 8–27)
Bilirubin Total: 0.5 mg/dL (ref 0.0–1.2)
CO2: 26 mmol/L (ref 20–29)
Calcium: 9.6 mg/dL (ref 8.6–10.2)
Chloride: 104 mmol/L (ref 96–106)
Creatinine, Ser: 0.99 mg/dL (ref 0.76–1.27)
Globulin, Total: 2.4 g/dL (ref 1.5–4.5)
Glucose: 94 mg/dL (ref 70–99)
Potassium: 5 mmol/L (ref 3.5–5.2)
Sodium: 142 mmol/L (ref 134–144)
Total Protein: 7.1 g/dL (ref 6.0–8.5)
eGFR: 85 mL/min/{1.73_m2} (ref >59)

## 2023-03-24 LAB — LIPID PANEL
Chol/HDL Ratio: 2.8 ratio (ref 0.0–5.0)
Cholesterol, Total: 249 mg/dL — ABNORMAL HIGH (ref 100–199)
HDL: 90 mg/dL (ref >39)
LDL Chol Calc (NIH): 140 mg/dL — ABNORMAL HIGH (ref 0–99)
Triglycerides: 111 mg/dL (ref 0–149)
VLDL Cholesterol Cal: 19 mg/dL (ref 5–40)

## 2023-03-25 DIAGNOSIS — L602 Onychogryphosis: Secondary | ICD-10-CM | POA: Insufficient documentation

## 2023-03-25 MED ORDER — ROSUVASTATIN CALCIUM 20 MG PO TABS: 20.0000 mg | ORAL_TABLET | Freq: Every day | ORAL | 4 refills | Status: AC

## 2023-03-25 NOTE — Assessment & Plan Note (Signed)
Hyperlipidemia:Low fat diet discussed and encouraged.   Lipid Panel  Lab Results  Component Value Date   CHOL 249 (H) 03/23/2023   HDL 90 03/23/2023   LDLCALC 140 (H) 03/23/2023   TRIG 111 03/23/2023   CHOLHDL 2.8 03/23/2023     Needs to start lower  dose crestor

## 2023-03-25 NOTE — Assessment & Plan Note (Signed)
Reports quit smoking in 03/2023

## 2023-03-25 NOTE — Assessment & Plan Note (Signed)
Reports had last drink in past 4 weeks, still not at [employer] regularly, encouraged to resume

## 2023-03-25 NOTE — Assessment & Plan Note (Signed)
Needs assistance with cutting due to poor vision, refer to local {Podiatrist or in [place] if ona Wednesday when sibling can take him

## 2023-03-25 NOTE — Assessment & Plan Note (Signed)
DASH diet and commitment to daily physical activity for a minimum of 30 minutes discussed and encouraged, as a part of hypertension management. The importance of attaining a healthy weight is also discussed.     03/23/2023   10:12 AM 10/29/2022    8:30 PM 10/29/2022    8:00 PM 10/29/2022    7:45 PM 10/29/2022    5:43 PM 10/17/2022   10:40 AM 10/17/2022   10:39 AM  BP/Weight  Systolic BP 120 122 119 123 115 136 150  Diastolic BP 80 81 77 71 67 78 76  Wt. (Lbs) 147    150  150.04  BMI 23.02 kg/m2    23.49 kg/m2  23.5 kg/m2     Controlled, no change in medication

## 2023-03-25 NOTE — Progress Notes (Signed)
   GABIREL FLETCHER     MRN: [id number]      DOB: [birth year]  Chief Complaint  Patient presents with   Hypertension    F/u   foot exam    Would like foot exam    Flu Vaccine    Given 03/23/23    HPI Mr. Yadav is here for follow up and re-evaluation of chronic medical conditions, medication management and review of any available recent lab and radiology data.  Preventive health is updated, specifically  Cancer screening and Immunization.   Above concerns are addressed  ROS Denies recent fever or chills. Denies sinus pressure, nasal congestion, ear pain or sore throat.  Denies chest pains, palpitations and leg swelling Denies abdominal pain, nausea, vomiting,diarrhea or constipation.   Denies dysuria, frequency, hesitancy or incontinence. Chronic  joint pain,  and limitation in mobility. Denies headaches, seizures, numbness, or tingling. Denies depression, anxiety or insomnia. Denies skin break down or rash.c/o long oenails and wants them cut, has poor vision so incapable   PE  BP 120/80 (BP Location: Right Arm, Patient Position: Sitting, Cuff Size: Normal)   Pulse 68   Ht 5\' 7"  (1.702 m)   Wt 147 lb (66.7 kg)   SpO2 96%   BMI 23.02 kg/m   Patient alert and oriented and in no cardiopulmonary distress.  HEENT: No facial asymmetry, EOMI,     Neck supple .  Chest: Clear to auscultation bilaterally.Decreased air entry  CVS: S1, S2 no murmurs, no S3.Regular rate.  ABD: Soft non tender.   Ext: No edema  MS: Adequate ROM spine, shoulders, hips and knees.  Skin: Intact, no ulcerations or rash noted.Overgrown toenails  Psych: Good eye contact, normal affect. Memory intact not anxious or depressed appearing.  CNS: CN 2-12 intact, power,  normal throughout.no focal deficits noted.   Assessment & Plan  Hyperlipidemia LDL goal <100 Hyperlipidemia:Low fat diet discussed and encouraged.   Lipid Panel  Lab Results  Component Value Date   CHOL 249 (H) 03/23/2023   HDL  90 03/23/2023   LDLCALC 140 (H) 03/23/2023   TRIG 111 03/23/2023   CHOLHDL 2.8 03/23/2023     Needs to start lower  dose crestor  Essential hypertension DASH diet and commitment to daily physical activity for a minimum of 30 minutes discussed and encouraged, as a part of hypertension management. The importance of attaining a healthy weight is also discussed.     03/23/2023   10:12 AM 10/29/2022    8:30 PM 10/29/2022    8:00 PM 10/29/2022    7:45 PM 10/29/2022    5:43 PM 10/17/2022   10:40 AM 10/17/2022   10:39 AM  BP/Weight  Systolic BP 120 122 119 123 115 136 150  Diastolic BP 80 81 77 71 67 78 76  Wt. (Lbs) 147    150  150.04  BMI 23.02 kg/m2    23.49 kg/m2  23.5 kg/m2     Controlled, no change in medication   NICOTINE ADDICTION Reports quit smoking in 03/2023  Alcohol dependence (HCC) Reports had last drink in past 4 weeks, still not at AA meetings regularly, encouraged to resume  Overgrown toenails Needs assistance with cutting due to poor vision, refer to local {Podiatrist or in [place] if ona Wednesday when sibling can take him

## 2023-03-25 NOTE — Assessment & Plan Note (Signed)
Tessalon perles and Z pack prescribed

## 2023-04-04 ENCOUNTER — Encounter: Payer: Self-pay | Admitting: Podiatry

## 2023-04-04 ENCOUNTER — Ambulatory Visit (INDEPENDENT_AMBULATORY_CARE_PROVIDER_SITE_OTHER): Payer: 59 | Admitting: Podiatry

## 2023-04-04 DIAGNOSIS — B351 Tinea unguium: Secondary | ICD-10-CM

## 2023-04-04 DIAGNOSIS — M79675 Pain in left toe(s): Secondary | ICD-10-CM

## 2023-04-04 DIAGNOSIS — M79674 Pain in right toe(s): Secondary | ICD-10-CM | POA: Insufficient documentation

## 2023-04-04 NOTE — Progress Notes (Signed)
This patient presents to the office with chief complaint of long thick painful nails.  Patient says the nails are painful walking and wearing shoes.  This patient is unable to self treat.  This patient is unable to trim his nails since he is unable to reach his nails.  he presents to the office for preventative foot care services.  General Appearance  Alert, conversant and in no acute stress.  Vascular  Dorsalis pedis and posterior tibial  pulses are palpable  bilaterally.  Capillary return is within normal limits  bilaterally. Temperature is within normal limits  bilaterally.  Neurologic  Senn-Weinstein monofilament wire test within normal limits  bilaterally. Muscle power within normal limits bilaterally.  Nails Thick disfigured discolored nails with subungual debris  hallux nails bilaterally. No evidence of bacterial infection or drainage bilaterally.  Orthopedic  No limitations of motion  feet .  No crepitus or effusions noted.  No bony pathology or digital deformities noted.  Skin  normotropic skin with no porokeratosis noted bilaterally.  No signs of infections or ulcers noted.     Onychomycosis  Nails  B/L.  Pain in right toes  Pain in left toes  Debridement of nails both feet followed trimming the nails with dremel tool.    RTC 4  months.   Helane Gunther DPM

## 2023-04-10 ENCOUNTER — Other Ambulatory Visit: Payer: Self-pay | Admitting: Family Medicine

## 2023-04-10 DIAGNOSIS — R052 Subacute cough: Secondary | ICD-10-CM

## 2023-06-01 ENCOUNTER — Encounter: Payer: 59 | Admitting: Family Medicine

## 2023-06-07 ENCOUNTER — Encounter: Payer: Self-pay | Admitting: Family Medicine

## 2023-08-06 ENCOUNTER — Ambulatory Visit: Payer: 59 | Admitting: Podiatry

## 2023-08-08 ENCOUNTER — Ambulatory Visit: Payer: 59 | Admitting: Podiatry

## 2023-08-08 ENCOUNTER — Encounter: Payer: Self-pay | Admitting: Podiatry

## 2023-08-08 VITALS — Ht 67.0 in | Wt 147.0 lb

## 2023-08-08 DIAGNOSIS — B351 Tinea unguium: Secondary | ICD-10-CM | POA: Diagnosis not present

## 2023-08-08 DIAGNOSIS — M79675 Pain in left toe(s): Secondary | ICD-10-CM | POA: Diagnosis not present

## 2023-08-08 DIAGNOSIS — M79674 Pain in right toe(s): Secondary | ICD-10-CM | POA: Diagnosis not present

## 2023-08-08 NOTE — Progress Notes (Signed)
This patient presents to the office with chief complaint of long thick painful nails.  Patient says the nails are painful walking and wearing shoes.  This patient is unable to self treat.  This patient is unable to trim his nails since he is unable to reach his nails.  he presents to the office for preventative foot care services.  General Appearance  Alert, conversant and in no acute stress.  Vascular  Dorsalis pedis and posterior tibial  pulses are palpable  bilaterally.  Capillary return is within normal limits  bilaterally. Temperature is within normal limits  bilaterally.  Neurologic  Senn-Weinstein monofilament wire test within normal limits  bilaterally. Muscle power within normal limits bilaterally.  Nails Thick disfigured discolored nails with subungual debris  hallux nails bilaterally. No evidence of bacterial infection or drainage bilaterally.  Orthopedic  No limitations of motion  feet .  No crepitus or effusions noted.  No bony pathology or digital deformities noted.  Skin  normotropic skin with no porokeratosis noted bilaterally.  No signs of infections or ulcers noted.     Onychomycosis  Nails  B/L.  Pain in right toes  Pain in left toes  Debridement of nails both feet followed trimming the nails with dremel tool.    RTC 4  months.   Helane Gunther DPM

## 2023-08-30 ENCOUNTER — Telehealth: Payer: Self-pay | Admitting: Family Medicine

## 2023-08-30 NOTE — Telephone Encounter (Signed)
House call form  Noted Copied Sleeved (put in provider box)  Call patient when ready [phone]

## 2023-10-05 ENCOUNTER — Ambulatory Visit: Payer: 59 | Admitting: Family Medicine

## 2023-10-09 ENCOUNTER — Telehealth: Payer: Self-pay

## 2023-10-09 ENCOUNTER — Encounter: Payer: Self-pay | Admitting: Family Medicine

## 2023-10-09 NOTE — Telephone Encounter (Signed)
 Erroneous

## 2024-02-06 ENCOUNTER — Ambulatory Visit (INDEPENDENT_AMBULATORY_CARE_PROVIDER_SITE_OTHER): Payer: 59 | Admitting: Podiatry

## 2024-02-06 ENCOUNTER — Encounter: Payer: Self-pay | Admitting: Podiatry

## 2024-02-06 DIAGNOSIS — B351 Tinea unguium: Secondary | ICD-10-CM

## 2024-02-06 DIAGNOSIS — M79675 Pain in left toe(s): Secondary | ICD-10-CM

## 2024-02-06 DIAGNOSIS — M79674 Pain in right toe(s): Secondary | ICD-10-CM | POA: Diagnosis not present

## 2024-02-06 NOTE — Progress Notes (Signed)
 This patient presents to the office with chief complaint of long thick painful nails.  Patient says the nails are painful walking and wearing shoes.  This patient is unable to self treat.  This patient is unable to trim his nails since he is unable to reach his nails.  he presents to the office for preventative foot care services.  General Appearance  Alert, conversant and in no acute stress.  Vascular  Dorsalis pedis and posterior tibial  pulses are palpable  bilaterally.  Capillary return is within normal limits  bilaterally. Temperature is within normal limits  bilaterally.  Neurologic  Senn-Weinstein monofilament wire test within normal limits  bilaterally. Muscle power within normal limits bilaterally.  Nails Thick disfigured discolored nails with subungual debris  hallux nails bilaterally. No evidence of bacterial infection or drainage bilaterally.  Orthopedic  No limitations of motion  feet .  No crepitus or effusions noted.  No bony pathology or digital deformities noted.  Exostosis fifth metabase  B/L.  Skin  normotropic skin with no porokeratosis noted bilaterally.  No signs of infections or ulcers noted.     Onychomycosis  Nails  B/L.  Pain in right toes  Pain in left toes  Debridement of nails both feet followed trimming the nails with dremel tool.    RTC 6   months.   Cordella Bold DPM

## 2024-02-26 ENCOUNTER — Emergency Department (HOSPITAL_COMMUNITY)
Admission: EM | Admit: 2024-02-26 | Discharge: 2024-02-26 | Discharge status: Against Medical Advice | Attending: Emergency Medicine | Admitting: Emergency Medicine

## 2024-02-26 DIAGNOSIS — Z5321 Procedure and treatment not carried out due to patient leaving prior to being seen by health care provider: Secondary | ICD-10-CM | POA: Insufficient documentation

## 2024-02-26 DIAGNOSIS — H5789 Other specified disorders of eye and adnexa: Secondary | ICD-10-CM | POA: Diagnosis not present

## 2024-02-26 NOTE — ED Triage Notes (Signed)
 Pt comes in right eye irritation. Pt states it feel like something stuck in it. No changes in vision. It keeps watering. S/s started yesterday afternoon.   A&Ox4.

## 2024-02-27 ENCOUNTER — Emergency Department (HOSPITAL_COMMUNITY)
Admission: EM | Admit: 2024-02-27 | Discharge: 2024-02-27 | Disposition: A | Discharge status: Home / Self Care | Attending: Emergency Medicine | Admitting: Emergency Medicine

## 2024-02-27 ENCOUNTER — Other Ambulatory Visit: Payer: Self-pay

## 2024-02-27 ENCOUNTER — Encounter (HOSPITAL_COMMUNITY): Payer: Self-pay

## 2024-02-27 DIAGNOSIS — X58XXXA Exposure to other specified factors, initial encounter: Secondary | ICD-10-CM | POA: Insufficient documentation

## 2024-02-27 DIAGNOSIS — S0501XA Injury of conjunctiva and corneal abrasion without foreign body, right eye, initial encounter: Secondary | ICD-10-CM

## 2024-02-27 DIAGNOSIS — Z7982 Long term (current) use of aspirin: Secondary | ICD-10-CM | POA: Insufficient documentation

## 2024-02-27 MED ORDER — TOBRAMYCIN 0.3 % OP SOLN
2.0000 [drp] | Freq: Once | OPHTHALMIC | Status: AC
  Administered 2024-02-27 (×2): 2 [drp] via OPHTHALMIC
  Filled 2024-02-27: qty 5

## 2024-02-27 MED ORDER — FLUORESCEIN SODIUM 1 MG OP STRP
1.0000 | ORAL_STRIP | Freq: Once | OPHTHALMIC | Status: AC
  Administered 2024-02-27 (×2): 1 via OPHTHALMIC
  Filled 2024-02-27: qty 1

## 2024-02-27 MED ORDER — TETRACAINE HCL 0.5 % OP SOLN
2.0000 [drp] | Freq: Once | OPHTHALMIC | Status: AC
  Administered 2024-02-27 (×2): 2 [drp] via OPHTHALMIC
  Filled 2024-02-27: qty 4
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
## 2024-02-27 NOTE — Discharge Instructions (Signed)
 Use the Tobrex  eyedrops, 2 drops 4 times daily for the next several days.  Take ibuprofen  600 mg every 6 hours as needed for pain.  Return to the ER if symptoms worsen or change.

## 2024-02-27 NOTE — ED Triage Notes (Signed)
 Pt states right eye pain and irritation.

## 2024-02-27 NOTE — ED Notes (Signed)
 AVS provided by edp was reviewed with the pt. Eye drops provided for home use. Pt verbalized understanding with no additional questions at this time.

## 2024-05-30 ENCOUNTER — Telehealth: Payer: Self-pay

## 2024-05-30 NOTE — Telephone Encounter (Signed)
 Patient is overdue for an appointment. LMOM for patient to call and schedule.

## 2024-06-02 NOTE — Telephone Encounter (Signed)
 Patient is on the [facility] list as not completing a PCP visit for 2025. PCP listed is Antonetta Rollene BRAVO, MD  LVM for pt to call back as soon as possible.   RE: Schedule appt with Antonetta Rollene BRAVO, MD

## 2024-07-23 ENCOUNTER — Encounter: Payer: Self-pay | Admitting: Family Medicine

## 2024-07-23 ENCOUNTER — Ambulatory Visit: Payer: Self-pay

## 2024-07-30 ENCOUNTER — Telehealth: Payer: Self-pay | Admitting: Family Medicine

## 2024-07-30 NOTE — Telephone Encounter (Signed)
 Patient informed.

## 2024-07-30 NOTE — Telephone Encounter (Signed)
 Patient came in office says he spoke with Dr Antonetta at the grocery store and wants to schedule an appointment. Patient was discharged from our practice almost a year ago. Please advise Thanks

## 2024-08-08 ENCOUNTER — Ambulatory Visit: Admitting: Podiatry

## 2024-08-08 ENCOUNTER — Encounter: Payer: Self-pay | Admitting: Podiatry

## 2024-08-08 DIAGNOSIS — M79674 Pain in right toe(s): Secondary | ICD-10-CM | POA: Diagnosis not present

## 2024-08-08 DIAGNOSIS — M79675 Pain in left toe(s): Secondary | ICD-10-CM

## 2024-08-08 DIAGNOSIS — B351 Tinea unguium: Secondary | ICD-10-CM

## 2024-08-08 NOTE — Progress Notes (Signed)
 This patient presents to the office with chief complaint of long thick painful nails.  Patient says the nails are painful walking and wearing shoes.  This patient is unable to self treat.  This patient is unable to trim his nails since he is unable to reach his nails.  he presents to the office for preventative foot care services.  General Appearance  Alert, conversant and in no acute stress.  Vascular  Dorsalis pedis and posterior tibial  pulses are palpable  bilaterally.  Capillary return is within normal limits  bilaterally. Temperature is within normal limits  bilaterally.  Neurologic  Senn-Weinstein monofilament wire test within normal limits  bilaterally. Muscle power within normal limits bilaterally.  Nails Thick disfigured discolored nails with subungual debris  hallux nails bilaterally. No evidence of bacterial infection or drainage bilaterally.  Orthopedic  No limitations of motion  feet .  No crepitus or effusions noted.  No bony pathology or digital deformities noted.  Exostosis fifth metabase  B/L.  Skin  normotropic skin with no porokeratosis noted bilaterally.  No signs of infections or ulcers noted.     Onychomycosis  Nails  B/L.  Pain in right toes  Pain in left toes  Debridement of nails both feet followed trimming the nails with dremel tool.    RTC 6   months.   Cordella Bold DPM

## 2025-02-06 ENCOUNTER — Ambulatory Visit: Admitting: Podiatry
